# Patient Record
Sex: Male | Born: 1955
Health system: Southern US, Community
[De-identification: ages and names within clinical notes are randomized; demographics above are authoritative.]

## PROBLEM LIST (undated history)

## (undated) DIAGNOSIS — I10 Essential (primary) hypertension: Secondary | ICD-10-CM

## (undated) DIAGNOSIS — E119 Type 2 diabetes mellitus without complications: Secondary | ICD-10-CM

---

## 2019-01-15 ENCOUNTER — Inpatient Hospital Stay (HOSPITAL_COMMUNITY): Payer: HRSA Program

## 2019-01-15 ENCOUNTER — Inpatient Hospital Stay (HOSPITAL_COMMUNITY)
Admission: EM | Admit: 2019-01-15 | Discharge: 2019-01-28 | DRG: 177 | Disposition: A | Discharge status: Home / Self Care | Payer: HRSA Program | Source: Other Acute Inpatient Hospital | Attending: Internal Medicine | Admitting: Internal Medicine

## 2019-01-15 ENCOUNTER — Emergency Department
Admission: EM | Admit: 2019-01-15 | Discharge: 2019-01-15 | Disposition: A | Discharge status: Other Acute Inpatient Hospital | Payer: HRSA Program | Attending: Emergency Medicine | Admitting: Emergency Medicine

## 2019-01-15 ENCOUNTER — Encounter (HOSPITAL_COMMUNITY): Payer: Self-pay

## 2019-01-15 ENCOUNTER — Encounter: Payer: Self-pay | Admitting: Emergency Medicine

## 2019-01-15 ENCOUNTER — Other Ambulatory Visit: Payer: Self-pay

## 2019-01-15 ENCOUNTER — Inpatient Hospital Stay (HOSPITAL_COMMUNITY): Payer: Self-pay

## 2019-01-15 ENCOUNTER — Emergency Department: Payer: HRSA Program

## 2019-01-15 DIAGNOSIS — E860 Dehydration: Secondary | ICD-10-CM | POA: Diagnosis present

## 2019-01-15 DIAGNOSIS — E1165 Type 2 diabetes mellitus with hyperglycemia: Secondary | ICD-10-CM | POA: Diagnosis not present

## 2019-01-15 DIAGNOSIS — G928 Other toxic encephalopathy: Secondary | ICD-10-CM

## 2019-01-15 DIAGNOSIS — J1289 Other viral pneumonia: Secondary | ICD-10-CM | POA: Diagnosis present

## 2019-01-15 DIAGNOSIS — M6282 Rhabdomyolysis: Secondary | ICD-10-CM | POA: Diagnosis present

## 2019-01-15 DIAGNOSIS — R509 Fever, unspecified: Secondary | ICD-10-CM

## 2019-01-15 DIAGNOSIS — I10 Essential (primary) hypertension: Secondary | ICD-10-CM | POA: Diagnosis not present

## 2019-01-15 DIAGNOSIS — U071 COVID-19: Secondary | ICD-10-CM

## 2019-01-15 DIAGNOSIS — I152 Hypertension secondary to endocrine disorders: Secondary | ICD-10-CM

## 2019-01-15 DIAGNOSIS — N179 Acute kidney failure, unspecified: Secondary | ICD-10-CM

## 2019-01-15 DIAGNOSIS — E785 Hyperlipidemia, unspecified: Secondary | ICD-10-CM | POA: Diagnosis present

## 2019-01-15 DIAGNOSIS — Z7984 Long term (current) use of oral hypoglycemic drugs: Secondary | ICD-10-CM

## 2019-01-15 DIAGNOSIS — Z87891 Personal history of nicotine dependence: Secondary | ICD-10-CM

## 2019-01-15 DIAGNOSIS — R0603 Acute respiratory distress: Secondary | ICD-10-CM

## 2019-01-15 DIAGNOSIS — R6521 Severe sepsis with septic shock: Secondary | ICD-10-CM | POA: Diagnosis not present

## 2019-01-15 DIAGNOSIS — A419 Sepsis, unspecified organism: Secondary | ICD-10-CM | POA: Diagnosis not present

## 2019-01-15 DIAGNOSIS — E872 Acidosis: Secondary | ICD-10-CM | POA: Diagnosis present

## 2019-01-15 DIAGNOSIS — R1310 Dysphagia, unspecified: Secondary | ICD-10-CM | POA: Diagnosis present

## 2019-01-15 DIAGNOSIS — I5033 Acute on chronic diastolic (congestive) heart failure: Secondary | ICD-10-CM | POA: Diagnosis present

## 2019-01-15 DIAGNOSIS — Z8611 Personal history of tuberculosis: Secondary | ICD-10-CM | POA: Diagnosis not present

## 2019-01-15 DIAGNOSIS — R0602 Shortness of breath: Secondary | ICD-10-CM | POA: Diagnosis present

## 2019-01-15 DIAGNOSIS — Z79899 Other long term (current) drug therapy: Secondary | ICD-10-CM | POA: Insufficient documentation

## 2019-01-15 DIAGNOSIS — J9601 Acute respiratory failure with hypoxia: Secondary | ICD-10-CM | POA: Diagnosis present

## 2019-01-15 DIAGNOSIS — G92 Toxic encephalopathy: Secondary | ICD-10-CM | POA: Diagnosis present

## 2019-01-15 DIAGNOSIS — E118 Type 2 diabetes mellitus with unspecified complications: Secondary | ICD-10-CM | POA: Diagnosis present

## 2019-01-15 DIAGNOSIS — J1520 Pneumonia due to staphylococcus, unspecified: Secondary | ICD-10-CM | POA: Diagnosis present

## 2019-01-15 DIAGNOSIS — N170 Acute kidney failure with tubular necrosis: Secondary | ICD-10-CM | POA: Diagnosis not present

## 2019-01-15 DIAGNOSIS — R739 Hyperglycemia, unspecified: Secondary | ICD-10-CM

## 2019-01-15 DIAGNOSIS — A4189 Other specified sepsis: Secondary | ICD-10-CM | POA: Diagnosis present

## 2019-01-15 DIAGNOSIS — R55 Syncope and collapse: Secondary | ICD-10-CM | POA: Diagnosis not present

## 2019-01-15 DIAGNOSIS — E875 Hyperkalemia: Secondary | ICD-10-CM | POA: Diagnosis present

## 2019-01-15 DIAGNOSIS — I5031 Acute diastolic (congestive) heart failure: Secondary | ICD-10-CM

## 2019-01-15 DIAGNOSIS — G9341 Metabolic encephalopathy: Secondary | ICD-10-CM

## 2019-01-15 DIAGNOSIS — Z66 Do not resuscitate: Secondary | ICD-10-CM | POA: Diagnosis present

## 2019-01-15 DIAGNOSIS — R7989 Other specified abnormal findings of blood chemistry: Secondary | ICD-10-CM

## 2019-01-15 DIAGNOSIS — I110 Hypertensive heart disease with heart failure: Secondary | ICD-10-CM | POA: Diagnosis present

## 2019-01-15 HISTORY — DX: Essential (primary) hypertension: I10

## 2019-01-15 HISTORY — DX: Type 2 diabetes mellitus without complications: E11.9

## 2019-01-15 LAB — URINALYSIS, COMPLETE (UACMP) WITH MICROSCOPIC
Bilirubin Urine: NEGATIVE
Glucose, UA: 150 mg/dL — AB
Ketones, ur: NEGATIVE mg/dL
Leukocytes,Ua: NEGATIVE
Nitrite: NEGATIVE
Protein, ur: >=300 mg/dL — AB
Specific Gravity, Urine: 1.031 — ABNORMAL HIGH (ref 1.005–1.030)
pH: 5 (ref 5.0–8.0)

## 2019-01-15 LAB — BLOOD GAS, ARTERIAL
Acid-base deficit: 5.2 mmol/L — ABNORMAL HIGH (ref 0.0–2.0)
Bicarbonate: 20 mmol/L (ref 20.0–28.0)
Delivery systems: POSITIVE
Expiratory PAP: 6
FIO2: 0.7
Inspiratory PAP: 12
Mechanical Rate: 8
O2 Saturation: 92.4 %
Patient temperature: 37
pCO2 arterial: 37 mmHg (ref 32.0–48.0)
pH, Arterial: 7.34 — ABNORMAL LOW (ref 7.350–7.450)
pO2, Arterial: 69 mmHg — ABNORMAL LOW (ref 83.0–108.0)

## 2019-01-15 LAB — CBC WITH DIFFERENTIAL/PLATELET
Abs Immature Granulocytes: 0.03 10*3/uL (ref 0.00–0.07)
Abs Immature Granulocytes: 0.06 10*3/uL (ref 0.00–0.07)
Basophils Absolute: 0.1 10*3/uL (ref 0.0–0.1)
Basophils Absolute: 0.1 10*3/uL (ref 0.0–0.1)
Basophils Relative: 1 %
Basophils Relative: 1 %
Eosinophils Absolute: 0.1 10*3/uL (ref 0.0–0.5)
Eosinophils Absolute: 0 10*3/uL (ref 0.0–0.5)
Eosinophils Relative: 1 %
Eosinophils Relative: 0 %
HCT: 49.8 % (ref 39.0–52.0)
HCT: 49.6 % (ref 39.0–52.0)
Hemoglobin: 15.3 g/dL (ref 13.0–17.0)
Hemoglobin: 15 g/dL (ref 13.0–17.0)
Immature Granulocytes: 0 %
Immature Granulocytes: 1 %
Lymphocytes Relative: 16 %
Lymphocytes Relative: 17 %
Lymphs Abs: 1.4 10*3/uL (ref 0.7–4.0)
Lymphs Abs: 1.1 10*3/uL (ref 0.7–4.0)
MCH: 24.2 pg — ABNORMAL LOW (ref 26.0–34.0)
MCH: 24.4 pg — ABNORMAL LOW (ref 26.0–34.0)
MCHC: 30.7 g/dL (ref 30.0–36.0)
MCHC: 30.2 g/dL (ref 30.0–36.0)
MCV: 78.7 fL — ABNORMAL LOW (ref 80.0–100.0)
MCV: 80.5 fL (ref 80.0–100.0)
Monocytes Absolute: 0.6 10*3/uL (ref 0.1–1.0)
Monocytes Absolute: 0.4 10*3/uL (ref 0.1–1.0)
Monocytes Relative: 7 %
Monocytes Relative: 6 %
Neutro Abs: 6.2 10*3/uL (ref 1.7–7.7)
Neutro Abs: 4.9 10*3/uL (ref 1.7–7.7)
Neutrophils Relative %: 75 %
Neutrophils Relative %: 75 %
Platelets: 236 10*3/uL (ref 150–400)
Platelets: 207 10*3/uL (ref 150–400)
RBC: 6.33 MIL/uL — ABNORMAL HIGH (ref 4.22–5.81)
RBC: 6.16 MIL/uL — ABNORMAL HIGH (ref 4.22–5.81)
RDW: 15.1 % (ref 11.5–15.5)
RDW: 15.1 % (ref 11.5–15.5)
WBC: 8.4 10*3/uL (ref 4.0–10.5)
WBC: 6.6 10*3/uL (ref 4.0–10.5)
nRBC: 0 % (ref 0.0–0.2)
nRBC: 0 % (ref 0.0–0.2)

## 2019-01-15 LAB — PROCALCITONIN
Procalcitonin: 74.8 ng/mL
Procalcitonin: 111.36 ng/mL

## 2019-01-15 LAB — URINALYSIS, ROUTINE W REFLEX MICROSCOPIC
Bilirubin Urine: NEGATIVE
Glucose, UA: 50 mg/dL — AB
Ketones, ur: NEGATIVE mg/dL
Leukocytes,Ua: NEGATIVE
Nitrite: NEGATIVE
Protein, ur: 100 mg/dL — AB
Specific Gravity, Urine: 1.019 (ref 1.005–1.030)
pH: 5 (ref 5.0–8.0)

## 2019-01-15 LAB — C-REACTIVE PROTEIN
CRP: 24.2 mg/dL — ABNORMAL HIGH (ref <1.0)
CRP: 35.6 mg/dL — ABNORMAL HIGH (ref <1.0)

## 2019-01-15 LAB — COMPREHENSIVE METABOLIC PANEL
ALT: 50 U/L — ABNORMAL HIGH (ref 0–44)
ALT: 55 U/L — ABNORMAL HIGH (ref 0–44)
AST: 69 U/L — ABNORMAL HIGH (ref 15–41)
AST: 111 U/L — ABNORMAL HIGH (ref 15–41)
Albumin: 4.3 g/dL (ref 3.5–5.0)
Albumin: 3.9 g/dL (ref 3.5–5.0)
Alkaline Phosphatase: 63 U/L (ref 38–126)
Alkaline Phosphatase: 50 U/L (ref 38–126)
Anion gap: 18 — ABNORMAL HIGH (ref 5–15)
Anion gap: 12 (ref 5–15)
BUN: 26 mg/dL — ABNORMAL HIGH (ref 8–23)
BUN: 34 mg/dL — ABNORMAL HIGH (ref 8–23)
CO2: 20 mmol/L — ABNORMAL LOW (ref 22–32)
CO2: 26 mmol/L (ref 22–32)
Calcium: 8.2 mg/dL — ABNORMAL LOW (ref 8.9–10.3)
Calcium: 8 mg/dL — ABNORMAL LOW (ref 8.9–10.3)
Chloride: 94 mmol/L — ABNORMAL LOW (ref 98–111)
Chloride: 97 mmol/L — ABNORMAL LOW (ref 98–111)
Creatinine, Ser: 1.99 mg/dL — ABNORMAL HIGH (ref 0.61–1.24)
Creatinine, Ser: 2.31 mg/dL — ABNORMAL HIGH (ref 0.61–1.24)
GFR calc Af Amer: 40 mL/min — ABNORMAL LOW (ref >60)
GFR calc Af Amer: 34 mL/min — ABNORMAL LOW (ref >60)
GFR calc non Af Amer: 35 mL/min — ABNORMAL LOW (ref >60)
GFR calc non Af Amer: 29 mL/min — ABNORMAL LOW (ref >60)
Glucose, Bld: 343 mg/dL — ABNORMAL HIGH (ref 70–99)
Glucose, Bld: 296 mg/dL — ABNORMAL HIGH (ref 70–99)
Potassium: 4.8 mmol/L (ref 3.5–5.1)
Potassium: 5.9 mmol/L — ABNORMAL HIGH (ref 3.5–5.1)
Sodium: 132 mmol/L — ABNORMAL LOW (ref 135–145)
Sodium: 135 mmol/L (ref 135–145)
Total Bilirubin: 0.6 mg/dL (ref 0.3–1.2)
Total Bilirubin: 0.5 mg/dL (ref 0.3–1.2)
Total Protein: 8.3 g/dL — ABNORMAL HIGH (ref 6.5–8.1)
Total Protein: 8 g/dL (ref 6.5–8.1)

## 2019-01-15 LAB — LACTIC ACID, PLASMA
Lactic Acid, Venous: 6 mmol/L (ref 0.5–1.9)
Lactic Acid, Venous: 3.6 mmol/L (ref 0.5–1.9)

## 2019-01-15 LAB — TRIGLYCERIDES: Triglycerides: 155 mg/dL — ABNORMAL HIGH (ref <150)

## 2019-01-15 LAB — BRAIN NATRIURETIC PEPTIDE
B Natriuretic Peptide: 161 pg/mL — ABNORMAL HIGH (ref 0.0–100.0)
B Natriuretic Peptide: 83.4 pg/mL (ref 0.0–100.0)

## 2019-01-15 LAB — CK: Total CK: 11100 U/L — ABNORMAL HIGH (ref 49–397)

## 2019-01-15 LAB — HEMOGLOBIN A1C
Hgb A1c MFr Bld: 8.2 % — ABNORMAL HIGH (ref 4.8–5.6)
Mean Plasma Glucose: 188.64 mg/dL

## 2019-01-15 LAB — GLUCOSE, CAPILLARY
Glucose-Capillary: 264 mg/dL — ABNORMAL HIGH (ref 70–99)
Glucose-Capillary: 229 mg/dL — ABNORMAL HIGH (ref 70–99)
Glucose-Capillary: 220 mg/dL — ABNORMAL HIGH (ref 70–99)

## 2019-01-15 LAB — D-DIMER, QUANTITATIVE: D-Dimer, Quant: 4.41 ug/mL-FEU — ABNORMAL HIGH (ref 0.00–0.50)

## 2019-01-15 LAB — TROPONIN I (HIGH SENSITIVITY)
Troponin I (High Sensitivity): 85 ng/L — ABNORMAL HIGH (ref <18)
Troponin I (High Sensitivity): 124 ng/L (ref <18)
Troponin I (High Sensitivity): 96 ng/L — ABNORMAL HIGH (ref <18)

## 2019-01-15 LAB — POC SARS CORONAVIRUS 2 AG: SARS Coronavirus 2 Ag: POSITIVE — AB

## 2019-01-15 LAB — FIBRINOGEN: Fibrinogen: 554 mg/dL — ABNORMAL HIGH (ref 210–475)

## 2019-01-15 LAB — HIV ANTIBODY (ROUTINE TESTING W REFLEX): HIV Screen 4th Generation wRfx: NONREACTIVE

## 2019-01-15 LAB — LACTATE DEHYDROGENASE: LDH: 343 U/L — ABNORMAL HIGH (ref 98–192)

## 2019-01-15 LAB — FIBRIN DERIVATIVES D-DIMER (ARMC ONLY): Fibrin derivatives D-dimer (ARMC): 2242.86 ng/mL (FEU) — ABNORMAL HIGH (ref 0.00–499.00)

## 2019-01-15 LAB — ABO/RH: ABO/RH(D): O POS

## 2019-01-15 LAB — FERRITIN: Ferritin: 402 ng/mL — ABNORMAL HIGH (ref 24–336)

## 2019-01-15 LAB — ETHANOL: Alcohol, Ethyl (B): <10 mg/dL (ref <10)

## 2019-01-15 MED ORDER — ACETAMINOPHEN 500 MG PO TABS
1000.0000 mg | ORAL_TABLET | Freq: Once | ORAL | Status: AC
  Administered 2019-01-15: 1000 mg via ORAL
  Filled 2019-01-15: qty 2

## 2019-01-15 MED ORDER — ONDANSETRON HCL 4 MG/2ML IJ SOLN: 4.0000 mg | Freq: Four times a day (QID) | INTRAMUSCULAR | Status: DC | PRN

## 2019-01-15 MED ORDER — SODIUM CHLORIDE 0.9 % IV SOLN: 100.0000 mg | Freq: Every day | INTRAVENOUS | Status: DC

## 2019-01-15 MED ORDER — ASPIRIN 81 MG PO CHEW
81.0000 mg | CHEWABLE_TABLET | Freq: Every day | ORAL | Status: DC
  Filled 2019-01-15: qty 1

## 2019-01-15 MED ORDER — SODIUM CHLORIDE 0.9 % IV SOLN
200.0000 mg | Freq: Once | INTRAVENOUS | Status: DC
  Filled 2019-01-15: qty 40

## 2019-01-15 MED ORDER — INSULIN ASPART 100 UNIT/ML ~~LOC~~ SOLN
10.0000 [IU] | Freq: Once | SUBCUTANEOUS | Status: AC
  Administered 2019-01-15: 10 [IU] via SUBCUTANEOUS
  Filled 2019-01-15: qty 1

## 2019-01-15 MED ORDER — INSULIN GLARGINE 100 UNIT/ML ~~LOC~~ SOLN
35.0000 [IU] | Freq: Every day | SUBCUTANEOUS | Status: DC
  Administered 2019-01-16 – 2019-01-18 (×3): 35 [IU] via SUBCUTANEOUS
  Filled 2019-01-15 (×4): qty 0.35

## 2019-01-15 MED ORDER — METHYLPREDNISOLONE SODIUM SUCC 40 MG IJ SOLR
40.0000 mg | Freq: Two times a day (BID) | INTRAMUSCULAR | Status: DC
  Administered 2019-01-15: 40 mg via INTRAVENOUS
  Filled 2019-01-15: qty 1

## 2019-01-15 MED ORDER — DEXTROSE 5 % IV SOLN: INTRAVENOUS | Status: DC

## 2019-01-15 MED ORDER — SODIUM CHLORIDE 0.9 % IV SOLN
200.0000 mg | Freq: Once | INTRAVENOUS | Status: AC
  Administered 2019-01-15: 200 mg via INTRAVENOUS
  Filled 2019-01-15: qty 40

## 2019-01-15 MED ORDER — SODIUM CHLORIDE 0.9 % IV BOLUS
1000.0000 mL | Freq: Once | INTRAVENOUS | Status: AC
  Administered 2019-01-15: 1000 mL via INTRAVENOUS

## 2019-01-15 MED ORDER — CLONIDINE HCL 0.2 MG/24HR TD PTWK
0.2000 mg | MEDICATED_PATCH | TRANSDERMAL | Status: DC
  Administered 2019-01-15: 0.2 mg via TRANSDERMAL
  Filled 2019-01-15: qty 1

## 2019-01-15 MED ORDER — AMLODIPINE BESYLATE 5 MG PO TABS
10.0000 mg | ORAL_TABLET | Freq: Every day | ORAL | Status: DC
  Administered 2019-01-16 – 2019-01-28 (×13): 10 mg via ORAL
  Filled 2019-01-15 (×13): qty 2

## 2019-01-15 MED ORDER — LACTATED RINGERS IV SOLN
INTRAVENOUS | Status: DC
  Administered 2019-01-15 – 2019-01-16 (×3): via INTRAVENOUS

## 2019-01-15 MED ORDER — ONDANSETRON HCL 4 MG PO TABS: 4.0000 mg | ORAL_TABLET | Freq: Four times a day (QID) | ORAL | Status: DC | PRN

## 2019-01-15 MED ORDER — METOPROLOL TARTRATE 5 MG/5ML IV SOLN
5.0000 mg | INTRAVENOUS | Status: DC | PRN
  Administered 2019-01-15: 5 mg via INTRAVENOUS
  Filled 2019-01-15: qty 5

## 2019-01-15 MED ORDER — SODIUM CHLORIDE 0.9 % IV SOLN
3.0000 g | Freq: Four times a day (QID) | INTRAVENOUS | Status: DC
  Administered 2019-01-15 – 2019-01-21 (×25): 3 g via INTRAVENOUS
  Filled 2019-01-15: qty 3
  Filled 2019-01-15 (×3): qty 8
  Filled 2019-01-15: qty 3
  Filled 2019-01-15 (×4): qty 8
  Filled 2019-01-15: qty 3
  Filled 2019-01-15: qty 8
  Filled 2019-01-15 (×2): qty 3
  Filled 2019-01-15 (×5): qty 8
  Filled 2019-01-15 (×2): qty 3
  Filled 2019-01-15 (×2): qty 8
  Filled 2019-01-15: qty 3
  Filled 2019-01-15 (×7): qty 8
  Filled 2019-01-15: qty 3

## 2019-01-15 MED ORDER — METHYLPREDNISOLONE SODIUM SUCC 40 MG IJ SOLR
40.0000 mg | Freq: Once | INTRAMUSCULAR | Status: AC
  Administered 2019-01-15: 40 mg via INTRAVENOUS
  Filled 2019-01-15: qty 1

## 2019-01-15 MED ORDER — LACTATED RINGERS IV SOLN
INTRAVENOUS | Status: DC
  Administered 2019-01-15: 11:00:00 via INTRAVENOUS

## 2019-01-15 MED ORDER — SODIUM CHLORIDE 0.9 % IV SOLN
500.0000 mg | Freq: Once | INTRAVENOUS | Status: AC
  Administered 2019-01-15: 500 mg via INTRAVENOUS

## 2019-01-15 MED ORDER — SODIUM CHLORIDE 0.9% FLUSH
3.0000 mL | Freq: Two times a day (BID) | INTRAVENOUS | Status: DC
  Administered 2019-01-15 (×2): 3 mL via INTRAVENOUS

## 2019-01-15 MED ORDER — INSULIN ASPART 100 UNIT/ML ~~LOC~~ SOLN
0.0000 [IU] | SUBCUTANEOUS | Status: DC
  Administered 2019-01-15 – 2019-01-16 (×3): 7 [IU] via SUBCUTANEOUS
  Administered 2019-01-16 (×2): 15 [IU] via SUBCUTANEOUS
  Administered 2019-01-16 (×2): 4 [IU] via SUBCUTANEOUS
  Administered 2019-01-16: 11 [IU] via SUBCUTANEOUS
  Administered 2019-01-17: 7 [IU] via SUBCUTANEOUS
  Administered 2019-01-17 (×2): 20 [IU] via SUBCUTANEOUS
  Administered 2019-01-17 (×2): 4 [IU] via SUBCUTANEOUS
  Administered 2019-01-18: 15 [IU] via SUBCUTANEOUS
  Administered 2019-01-18: 7 [IU] via SUBCUTANEOUS
  Administered 2019-01-18: 3 [IU] via SUBCUTANEOUS
  Administered 2019-01-18: 4 [IU] via SUBCUTANEOUS
  Administered 2019-01-18: 7 [IU] via SUBCUTANEOUS
  Administered 2019-01-19 (×2): 15 [IU] via SUBCUTANEOUS
  Administered 2019-01-19 (×2): 3 [IU] via SUBCUTANEOUS
  Administered 2019-01-20: 7 [IU] via SUBCUTANEOUS
  Administered 2019-01-20: 4 [IU] via SUBCUTANEOUS
  Administered 2019-01-20: 09:00:00 3 [IU] via SUBCUTANEOUS
  Administered 2019-01-20: 11 [IU] via SUBCUTANEOUS
  Administered 2019-01-21: 20 [IU] via SUBCUTANEOUS
  Administered 2019-01-21: 4 [IU] via SUBCUTANEOUS
  Administered 2019-01-22: 20 [IU] via SUBCUTANEOUS
  Administered 2019-01-22: 7 [IU] via SUBCUTANEOUS
  Administered 2019-01-22: 11 [IU] via SUBCUTANEOUS
  Administered 2019-01-22: 20 [IU] via SUBCUTANEOUS
  Administered 2019-01-22: 20:00:00 7 [IU] via SUBCUTANEOUS
  Administered 2019-01-23 (×2): 4 [IU] via SUBCUTANEOUS
  Administered 2019-01-23: 7 [IU] via SUBCUTANEOUS
  Administered 2019-01-23: 15 [IU] via SUBCUTANEOUS
  Administered 2019-01-23: 7 [IU] via SUBCUTANEOUS
  Administered 2019-01-23: 15 [IU] via SUBCUTANEOUS
  Administered 2019-01-23: 3 [IU] via SUBCUTANEOUS
  Administered 2019-01-24: 20 [IU] via SUBCUTANEOUS
  Administered 2019-01-24: 3 [IU] via SUBCUTANEOUS
  Administered 2019-01-24: 11 [IU] via SUBCUTANEOUS
  Administered 2019-01-24: 15 [IU] via SUBCUTANEOUS
  Administered 2019-01-24: 3 [IU] via SUBCUTANEOUS
  Administered 2019-01-25: 11 [IU] via SUBCUTANEOUS
  Administered 2019-01-25 (×3): 3 [IU] via SUBCUTANEOUS
  Administered 2019-01-25: 11 [IU] via SUBCUTANEOUS
  Administered 2019-01-25: 7 [IU] via SUBCUTANEOUS
  Administered 2019-01-26: 11 [IU] via SUBCUTANEOUS
  Administered 2019-01-26: 15 [IU] via SUBCUTANEOUS
  Administered 2019-01-26: 11 [IU] via SUBCUTANEOUS
  Administered 2019-01-26: 7 [IU] via SUBCUTANEOUS
  Administered 2019-01-26: 4 [IU] via SUBCUTANEOUS
  Administered 2019-01-27 (×2): 11 [IU] via SUBCUTANEOUS
  Administered 2019-01-27: 7 [IU] via SUBCUTANEOUS
  Administered 2019-01-27: 15 [IU] via SUBCUTANEOUS
  Administered 2019-01-28: 7 [IU] via SUBCUTANEOUS

## 2019-01-15 MED ORDER — SODIUM POLYSTYRENE SULFONATE 15 GM/60ML PO SUSP
30.0000 g | Freq: Once | ORAL | Status: AC
  Administered 2019-01-15: 30 g via ORAL
  Filled 2019-01-15: qty 120

## 2019-01-15 MED ORDER — SODIUM CHLORIDE 0.9 % IV SOLN
100.0000 mg | Freq: Every day | INTRAVENOUS | Status: AC
  Administered 2019-01-16 – 2019-01-19 (×4): 100 mg via INTRAVENOUS
  Filled 2019-01-15 (×4): qty 20

## 2019-01-15 MED ORDER — INSULIN ASPART 100 UNIT/ML ~~LOC~~ SOLN
0.0000 [IU] | Freq: Three times a day (TID) | SUBCUTANEOUS | Status: DC
  Administered 2019-01-15: 5 [IU] via SUBCUTANEOUS

## 2019-01-15 MED ORDER — CARVEDILOL 3.125 MG PO TABS
6.2500 mg | ORAL_TABLET | Freq: Two times a day (BID) | ORAL | Status: DC
  Administered 2019-01-16 – 2019-01-28 (×25): 6.25 mg via ORAL
  Filled 2019-01-15 (×26): qty 2

## 2019-01-15 MED ORDER — INSULIN ASPART 100 UNIT/ML ~~LOC~~ SOLN: 0.0000 [IU] | Freq: Every day | SUBCUTANEOUS | Status: DC

## 2019-01-15 MED ORDER — INSULIN GLARGINE 100 UNIT/ML ~~LOC~~ SOLN
15.0000 [IU] | Freq: Once | SUBCUTANEOUS | Status: DC
  Filled 2019-01-15: qty 0.15

## 2019-01-15 MED ORDER — DOCUSATE SODIUM 100 MG PO CAPS
100.0000 mg | ORAL_CAPSULE | Freq: Two times a day (BID) | ORAL | Status: DC
  Administered 2019-01-16 – 2019-01-28 (×24): 100 mg via ORAL
  Filled 2019-01-15 (×24): qty 1

## 2019-01-15 MED ORDER — INSULIN ASPART 100 UNIT/ML ~~LOC~~ SOLN: 0.0000 [IU] | Freq: Three times a day (TID) | SUBCUTANEOUS | Status: DC

## 2019-01-15 MED ORDER — ACETAMINOPHEN 325 MG PO TABS
650.0000 mg | ORAL_TABLET | Freq: Four times a day (QID) | ORAL | Status: DC | PRN
  Administered 2019-01-17 – 2019-01-22 (×8): 650 mg via ORAL
  Filled 2019-01-15 (×10): qty 2

## 2019-01-15 MED ORDER — INSULIN GLARGINE 100 UNIT/ML ~~LOC~~ SOLN
20.0000 [IU] | Freq: Every day | SUBCUTANEOUS | Status: DC
  Administered 2019-01-15: 20 [IU] via SUBCUTANEOUS
  Filled 2019-01-15: qty 0.2

## 2019-01-15 MED ORDER — INSULIN ASPART 100 UNIT/ML ~~LOC~~ SOLN
3.0000 [IU] | Freq: Three times a day (TID) | SUBCUTANEOUS | Status: DC
  Administered 2019-01-16 – 2019-01-27 (×30): 3 [IU] via SUBCUTANEOUS

## 2019-01-15 MED ORDER — ACETAMINOPHEN 650 MG RE SUPP
650.0000 mg | RECTAL | Status: DC | PRN
  Administered 2019-01-15 – 2019-01-18 (×2): 650 mg via RECTAL
  Filled 2019-01-15 (×3): qty 1

## 2019-01-15 MED ORDER — SODIUM CHLORIDE 0.9 % IV BOLUS (SEPSIS): 1000.0000 mL | Freq: Once | INTRAVENOUS | Status: DC

## 2019-01-15 MED ORDER — SODIUM POLYSTYRENE SULFONATE 15 GM/60ML PO SUSP: 30.0000 g | Freq: Once | ORAL | Status: DC

## 2019-01-15 MED ORDER — HEPARIN SODIUM (PORCINE) 5000 UNIT/ML IJ SOLN
7500.0000 [IU] | Freq: Three times a day (TID) | INTRAMUSCULAR | Status: DC
  Administered 2019-01-15 – 2019-01-16 (×3): 7500 [IU] via SUBCUTANEOUS
  Filled 2019-01-15 (×4): qty 2

## 2019-01-15 MED ORDER — PRAVASTATIN SODIUM 40 MG PO TABS: 40.0000 mg | ORAL_TABLET | Freq: Every day | ORAL | Status: DC

## 2019-01-15 MED ORDER — SODIUM CHLORIDE 0.9 % IV SOLN
1.0000 g | Freq: Once | INTRAVENOUS | Status: AC
  Administered 2019-01-15: 1 g via INTRAVENOUS

## 2019-01-15 MED ORDER — DEXAMETHASONE SODIUM PHOSPHATE 10 MG/ML IJ SOLN
10.0000 mg | Freq: Once | INTRAMUSCULAR | Status: AC
  Administered 2019-01-15: 10 mg via INTRAVENOUS
  Filled 2019-01-15: qty 1

## 2019-01-15 MED ORDER — HEPARIN SODIUM (PORCINE) 5000 UNIT/ML IJ SOLN: 5000.0000 [IU] | Freq: Three times a day (TID) | INTRAMUSCULAR | Status: DC

## 2019-01-15 NOTE — CV Procedure (Signed)
2D echo was attempted twice but RN stataed he was going to CT, then tried at 3:06 and staff doing patient care.

## 2019-01-15 NOTE — Progress Notes (Signed)
Pt noted to have open scratches aprox. 2-3cm wide with some scabs. Also noted to have 5 elongated scratches on R buttock approx 4-5 inches long. Wound consult to be requested.

## 2019-01-15 NOTE — H&P (Signed)
TRH H&P   Patient Demographics:    Austin Shannon, is a 63 y.o. male  MRN: [id number]   DOB - 08-Oct-1955  Admit Date - 01/15/2019  Outpatient Primary MD for the patient is Patient, No Pcp Per  Patient coming from: [facility] ER.  Found down at house   HPI:    Austin Shannon  is a 63 y.o. 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   She had arrived around 10 AM on 01/15/2019 [facility] extremely lethargic, short of breath and gurgling, lactate greater than 3, creatinine greater than 2.5, ABG suggesting severe hypoxia, on nonrebreather mask, AKI, transaminitis, elevated troponin, hyperkalemic, high CK, extremely elevated procalcitonin, he answers basic questions follows basic commands and states that he has been having a cough and low-grade fever for last several days.  Denies any focal weakness, denies any aches or pains.  Also called his wife over the phone who states that she has been out of town and talking to him over the phone and realized that he was sick few days ago.    Review of systems:    A full 10 point Review of Systems was done, except as stated above, all other Review of Systems were negative.   With Past History of the following :      Past Medical History:  Diagnosis Date   Diabetes mellitus without complication (HCC)    Hypertension       No past surgical history on file.    Social History:     Social History   Tobacco Use   Smoking status: Former Smoker   Smokeless tobacco: Never Used  Substance Use Topics   Alcohol use: Not on file         Family History :   No family history of COVID-19   Home Medications:  Prior to Admission medications   Medication Sig Start Date End Date Taking? Authorizing Provider  albuterol (VENTOLIN HFA) 108 (90 Base) MCG/ACT inhaler Inhale into the lungs every 6 (six) hours as needed for wheezing or shortness of breath.   Yes [provider]  carvedilol (COREG) 12.5 MG tablet Take 12.5 mg by mouth 2 (two) times daily.   Yes [provider]  cetirizine (ZYRTEC) 10 MG tablet Take 10 mg by mouth daily.   Yes [provider]  glimepiride (AMARYL) 2 MG tablet Take 2 mg by mouth 3 (three) times daily. 12/23/18  Yes [provider]  hydrALAZINE (APRESOLINE) 50 MG tablet Take 50 mg by mouth 2 (two) times daily.   Yes [provider]  ibuprofen (ADVIL) 600 MG tablet Take 600 mg by mouth 3 (three) times daily as needed.   Yes [provider]  metFORMIN (GLUCOPHAGE) 850 MG tablet Take 850 mg by mouth 2 (two) times daily with a meal.   Yes [provider]  pravastatin (PRAVACHOL) 40 MG tablet Take 40 mg by mouth daily. 12/23/18  Yes [provider]  sildenafil (VIAGRA) 100 MG tablet Take 100 mg by mouth daily as needed for erectile dysfunction.   Yes [provider]     Allergies:    No Known Allergies   Physical Exam:   Vitals  Temperature 99.8 F (37.7 C), temperature source Axillary.   1. General middle-aged well-built African-American gentleman who appears lethargic, wearing nonrebreather mask, gurgling and little short of breath, able to answer basic questions and follow basic  commands,  2. Normal affect and insight, Not Suicidal or Homicidal, Awake Alert, moving all 4 extremities to command,  3. No F.N deficits, ALL C.Nerves Intact, Strength 5/5 all 4 extremities, Sensation intact all 4 extremities, Plantars down going.  4. Ears and Eyes appear Normal, Conjunctivae clear, PERRLA. Moist Oral Mucosa.  5. Supple Neck, No JVD, No cervical lymphadenopathy appriciated, No Carotid Bruits.  6. Symmetrical Chest wall movement, Good air movement bilaterally, coarse bilateral breath sounds.  7. RRR, No Gallops, Rubs or Murmurs, No Parasternal Heave.  8. Positive Bowel Sounds, Abdomen Soft, No tenderness, No organomegaly appriciated,No rebound -guarding or rigidity.  9.  No Cyanosis, Normal Skin Turgor, No Skin Rash or Bruise.  10. Good muscle tone,  joints appear normal , no effusions, Normal ROM.  11. No Palpable Lymph Nodes in Neck or Axillae       Data Review:    CBC Recent Labs  Lab 01/15/19 0257 01/15/19 1136  WBC 8.4 6.6  HGB 15.3 15.0  HCT 49.8 49.6  PLT 236 207  MCV 78.7* 80.5  MCH 24.2* 24.4*  MCHC 30.7 30.2  RDW 15.1 15.1  LYMPHSABS 1.4 1.1  MONOABS 0.6 0.4  EOSABS 0.1 0.0  BASOSABS 0.1 0.1   ------------------------------------------------------------------------------------------------------------------  Chemistries  Recent Labs  Lab 01/15/19 0257 01/15/19 1136  NA 132* 135  K 4.8 5.9*  CL 94* 97*  CO2 20* 26  GLUCOSE 343* 296*  BUN 26* 34*  CREATININE 1.99* 2.31*  CALCIUM 8.2* 8.0*  AST 69* 111*  ALT 50* 55*  ALKPHOS 63 50  BILITOT 0.6 0.5   ------------------------------------------------------------------------------------------------------------------ CrCl cannot be calculated (Unknown ideal weight.). ------------------------------------------------------------------------------------------------------------------ No results for input(s): TSH, T4TOTAL, T3FREE, THYROIDAB in the last 72 hours.  Invalid input(s):  FREET3  Coagulation profile No results for input(s): INR, PROTIME in the last 168 hours. ------------------------------------------------------------------------------------------------------------------- Recent Labs    01/15/19 1136  DDIMER 4.41*   -------------------------------------------------------------------------------------------------------------------  Cardiac Enzymes No results for input(s): CKMB, TROPONINI, MYOGLOBIN in the last 168 hours.  Invalid input(s): CK ------------------------------------------------------------------------------------------------------------------    Component Value Date/Time   BNP 83.4 01/15/2019 1136     ---------------------------------------------------------------------------------------------------------------  Urinalysis    Component Value Date/Time   COLORURINE AMBER (A) 01/15/2019 0941   APPEARANCEUR CLOUDY (A) 01/15/2019 0941   LABSPEC 1.031 (H) 01/15/2019 0941   PHURINE 5.0 01/15/2019 0941   GLUCOSEU 150 (A) 01/15/2019 0941   HGBUR LARGE (A) 01/15/2019 0941   BILIRUBINUR NEGATIVE 01/15/2019 0941   KETONESUR NEGATIVE 01/15/2019 0941   PROTEINUR >=300 (A) 01/15/2019 0941   NITRITE NEGATIVE 01/15/2019 0941   LEUKOCYTESUR NEGATIVE 01/15/2019 0941    ----------------------------------------------------------------------------------------------------------------   Imaging Results:    Cxr Am  Result Date: 01/15/2019 CLINICAL DATA:  Acute shortness of breath. Follow-up COVID-19 pneumonia. EXAM: PORTABLE CHEST 1 VIEW 11:16 a.m.: COMPARISON:  Portable chest x-ray earlier same day at 2:55 a.m. FINDINGS: Cardiac silhouette enlarged for AP portable technique, unchanged. Progressive airspace consolidation involving the lower lobes and the RIGHT MIDDLE LOBE since the examination earlier this morning. Streaky opacities in the lingula. UPPER lobes remain clear otherwise. Normal pulmonary vascularity. IMPRESSION: 1. Progressive  bilateral lower lobe pneumonia since the examination earlier this morning. 2. Streaky opacities in the lingula, likely atelectasis. 3. Stable cardiomegaly without pulmonary edema. Electronically Signed   By: Hulan Saas M.D.   On: 01/15/2019 11:26   Dg Chest Port 1 View  Result Date: 01/15/2019 CLINICAL DATA:  Shortness of breath. EXAM: PORTABLE CHEST 1 VIEW COMPARISON:  None. FINDINGS: The heart is enlarged. Patchy right greater than left airspace opacities in the lung bases. No pulmonary edema. No definite pleural effusion. No pneumothorax. No acute osseous abnormalities are seen. IMPRESSION: 1. Right greater than left basilar airspace opacities, suspicious for pneumonia. 2. Mild cardiomegaly. Electronically Signed   By: Narda Rutherford M.D.   On: 01/15/2019 03:19    My personal review of EKG: Rhythm sinus tachycardia with no acute ST changes.  EKG from [place].   Assessment & Plan:    Active Problems:   COVID-19 virus infection    1.  Sepsis, septic shock, COVID-19 pneumonia, cannot rule out concurrent bacterial infection.  Patient is extremely sick, he is hypotensive and in shock, he has AKI, transaminitis, COVID-19 infection with elevated lactate and procalcitonin in excess of 100.  He could easily have a concurrent bacterial source, UA appears unremarkable so far, at this time he will be placed on IV steroids, IV remdesivir and Unasyn.  Blood cultures and urine cultures will be ordered, he will be hydrated with IV fluids and bolus, will monitor lactate and procalcitonin along with cultures.  His condition is extremely guarded.  Cannot use Actemra due to extremely elevated procalcitonin in excess of 100.  2.  Hypotension, dehydration, rhabdomyolysis with AKI and hyperkalemia.  Hydrate, Foley catheter, renal ultrasound, trend CK and renal function.  Kayexalate now and then in the evening.  3.  Transaminitis.  Due to COVID-19 along with rhabdomyolysis, symptom-free no abdominal pain.   However due to extremely high procalcitonin and will check CT scan chest, abdomen and pelvis on immediate basis, for now cover him with Unasyn and monitor.  4.  Dyslipidemia.  Home dose statin from tomorrow.  5.  DM type II.  Check A1c, placed on Lantus and sliding scale every 4.  5.  Toxic encephalopathy.  Mild.  Soft diet, speech to evaluate, PT OT, supportive care and monitor.  6.  Borderline  elevated troponin.  Not an ACS pattern, chest pain-free, EKG nonacute, aspirin, low-dose beta-blocker and statin from tomorrow.  7.  Hypertension.  Low-dose beta-blocker if blood pressure tolerates.     DVT Prophylaxis Heparin    AM Labs Ordered, also please review Full Orders  Family Communication: Admission, patients condition and plan of care including tests being ordered have been discussed with the patient , wife and friend who indicate understanding and agree with the plan and Code Status.  Code Status DNR  Likely DC to  TBD  Condition GUARDED    Consults called: None    Admission status: Inpt    Time spent in minutes : 35   Susa Raring M.D on 01/15/2019 at 2:27 PM  To page go to [email or web address] - password [facility]

## 2019-01-15 NOTE — ED Notes (Signed)
PT GIVES VERBAL CONSENT FOR TRANSFER 

## 2019-01-15 NOTE — ED Notes (Signed)
carelink at bedside to take pt. Pt remains on NRB. ST rhythm. BP stable.

## 2019-01-15 NOTE — ED Provider Notes (Signed)
[facility] Emergency Department Provider Note   ____________________________________________   First MD Initiated Contact with Patient 01/15/19 [phone]     (approximate)  I have reviewed the triage vital signs and the nursing notes.   HISTORY  Chief Complaint Syncope, shortness of breath   HPI Austin BrakemanGregg Shannon is a 63 y.o. male brought to the ED [place] with a chief complaint of syncope and shortness of breath.  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  Room air saturation 82% upon EMS arrival.  Arrives on nonrebreather with saturations 96%.  Patient endorses cough and shortness of breath.  Denies fever, chest pain, abdominal pain, nausea or vomiting.       Past medical history Hypertension Diabetes  There are no active problems to display for this patient.    Prior to Admission medications   Medication Sig Start Date End Date Taking? Authorizing Provider  albuterol (VENTOLIN HFA) 108 (90 Base) MCG/ACT inhaler Inhale into the lungs every 6 (six) hours as needed for wheezing or shortness of breath.   Yes [provider]  carvedilol (COREG) 12.5 MG tablet Take 12.5 mg by mouth 2 (two) times daily.   Yes [provider]  glimepiride (AMARYL) 1 MG tablet Take 1 mg by mouth 3 (three) times daily with meals.   Yes [provider]  hydrALAZINE (APRESOLINE) 50 MG tablet Take 50 mg by mouth 2 (two) times daily.   Yes [provider]  ibuprofen (ADVIL) 600 MG tablet Take 600 mg by mouth 3 (three) times daily as needed.   Yes [provider]  metFORMIN (GLUCOPHAGE) 850 MG tablet Take 850 mg by mouth 2 (two) times daily with a meal.   Yes [provider]  sildenafil (VIAGRA) 100 MG tablet Take 100 mg by mouth daily as needed for erectile dysfunction.   Yes [provider]    Allergies Patient has no allergy information on  record.  No family history on file.  Social History Social History   Tobacco Use   Smoking status: Not on file  Substance Use Topics   Alcohol use: Not on file   Drug use: Not on file  No recent EtOH  Review of Systems  Constitutional: No fever/chills Eyes: No visual changes. ENT: No sore throat. Cardiovascular: Denies chest pain. Respiratory: Positive for cough and shortness of breath. Gastrointestinal: No abdominal pain.  No nausea, no vomiting.  No diarrhea.  No constipation. Genitourinary: Negative for dysuria. Musculoskeletal: Negative for back pain. Skin: Negative for rash. Neurological: Positive for syncope.  Negative for headaches, focal weakness or numbness.   ____________________________________________   PHYSICAL EXAM:  VITAL SIGNS: ED Triage Vitals  Enc Vitals Group     BP      Pulse      Resp      Temp      Temp src      SpO2      Weight      Height      Head Circumference      Peak Flow      Pain Score      Pain Loc      Pain Edu?      Excl. in GC?     Constitutional: Alert and oriented.  Ill appearing and in moderate acute distress. Eyes: Conjunctivae are normal. PERRL. EOMI. Head: Atraumatic. Nose: No congestion/rhinnorhea. Mouth/Throat: Mucous membranes are  moist.  Oropharynx non-erythematous. Neck: No stridor.   Cardiovascular: Tachycardic rate, regular rhythm. Grossly normal heart sounds.  Good peripheral circulation. Respiratory: Increased respiratory effort.  No retractions. Lungs with bilateral rales. Gastrointestinal: Soft and nontender. No distention. No abdominal bruits. No CVA tenderness. Musculoskeletal: No lower extremity tenderness nor edema.  No joint effusions. Neurologic:  Normal speech and language. No gross focal neurologic deficits are appreciated.  Skin:  Skin is warm, dry and intact. No rash noted. Psychiatric: Mood and affect are normal. Speech and behavior are  normal.  ____________________________________________   LABS (all labs ordered are listed, but only abnormal results are displayed)  Labs Reviewed  CBC WITH DIFFERENTIAL/PLATELET - Abnormal; Notable for the following components:      Result Value   RBC 6.33 (*)    MCV 78.7 (*)    MCH 24.2 (*)    All other components within normal limits  LACTIC ACID, PLASMA - Abnormal; Notable for the following components:   Lactic Acid, Venous 6.0 (*)    All other components within normal limits  COMPREHENSIVE METABOLIC PANEL - Abnormal; Notable for the following components:   Sodium 132 (*)    Chloride 94 (*)    CO2 20 (*)    Glucose, Bld 343 (*)    BUN 26 (*)    Creatinine, Ser 1.99 (*)    Calcium 8.2 (*)    Total Protein 8.3 (*)    AST 69 (*)    ALT 50 (*)    GFR calc non Af Amer 35 (*)    GFR calc Af Amer 40 (*)    Anion gap 18 (*)    All other components within normal limits  BRAIN NATRIURETIC PEPTIDE - Abnormal; Notable for the following components:   B Natriuretic Peptide 161.0 (*)    All other components within normal limits  FIBRIN DERIVATIVES D-DIMER ([facility] ONLY) - Abnormal; Notable for the following components:   Fibrin derivatives D-dimer ([facility]) 2,242.86 (*)    All other components within normal limits  LACTATE DEHYDROGENASE - Abnormal; Notable for the following components:   LDH 343 (*)    All other components within normal limits  FERRITIN - Abnormal; Notable for the following components:   Ferritin 402 (*)    All other components within normal limits  TRIGLYCERIDES - Abnormal; Notable for the following components:   Triglycerides 155 (*)    All other components within normal limits  FIBRINOGEN - Abnormal; Notable for the following components:   Fibrinogen 554 (*)    All other components within normal limits  BLOOD GAS, ARTERIAL - Abnormal; Notable for the following components:   pH, Arterial 7.34 (*)    pO2, Arterial 69 (*)    Acid-base deficit 5.2 (*)    All other  components within normal limits  POC SARS CORONAVIRUS 2 AG - Abnormal; Notable for the following components:   SARS Coronavirus 2 Ag POSITIVE (*)    All other components within normal limits  TROPONIN I (HIGH SENSITIVITY) - Abnormal; Notable for the following components:   Troponin I (High Sensitivity) 85 (*)    All other components within normal limits  TROPONIN I (HIGH SENSITIVITY) - Abnormal; Notable for the following components:   Troponin I (High Sensitivity) 124 (*)    All other components within normal limits  CULTURE, BLOOD (ROUTINE X 2)  CULTURE, BLOOD (ROUTINE X 2)  URINE CULTURE  ETHANOL  PROCALCITONIN  URINALYSIS, COMPLETE (UACMP) WITH MICROSCOPIC  C-REACTIVE PROTEIN  POC  SARS CORONAVIRUS 2 AG -  ED   ____________________________________________  EKG  ED ECG REPORT I, Brett Soza J, the attending physician, personally viewed and interpreted this ECG.   Date: 01/15/2019  EKG Time: 0250  Rate: 130  Rhythm: sinus tachycardia  Axis: Normal  Intervals:none  ST&T Change: Nonspecific  ____________________________________________  RADIOLOGY  ED MD interpretation: R>L airspace opacities  Official radiology report(s): Dg Chest Port 1 View  Result Date: 01/15/2019 CLINICAL DATA:  Shortness of breath. EXAM: PORTABLE CHEST 1 VIEW COMPARISON:  None. FINDINGS: The heart is enlarged. Patchy right greater than left airspace opacities in the lung bases. No pulmonary edema. No definite pleural effusion. No pneumothorax. No acute osseous abnormalities are seen. IMPRESSION: 1. Right greater than left basilar airspace opacities, suspicious for pneumonia. 2. Mild cardiomegaly. Electronically Signed   By: Keith Rake M.D.   On: 01/15/2019 03:19    ____________________________________________   PROCEDURES  Procedure(s) performed (including Critical Care):  Procedures  CRITICAL CARE Performed by: Paulette Blanch   Total critical care time: 45 minutes  Critical care time  was exclusive of separately billable procedures and treating other patients.  Critical care was necessary to treat or prevent imminent or life-threatening deterioration.  Critical care was time spent personally by me on the following activities: development of treatment plan with patient and/or surrogate as well as nursing, discussions with consultants, evaluation of patient's response to treatment, examination of patient, obtaining history from patient or surrogate, ordering and performing treatments and interventions, ordering and review of laboratory studies, ordering and review of radiographic studies, pulse oximetry and re-evaluation of patient's condition. ____________________________________________   INITIAL IMPRESSION / ASSESSMENT AND PLAN / ED COURSE  As part of my medical decision making, I reviewed the following data within the [place] notes reviewed and incorporated, Labs reviewed, EKG interpreted, Old chart reviewed, Radiograph reviewed and Notes from prior ED visits     Austin Shannon was evaluated in Emergency Department on 01/15/2019 for the symptoms described in the history of present illness. He was evaluated in the context of the global COVID-19 pandemic, which necessitated consideration that the patient might be at risk for infection with the SARS-CoV-2 virus that causes COVID-19. Institutional protocols and algorithms that pertain to the evaluation of patients at risk for COVID-19 are in a state of rapid change based on information released by regulatory bodies including the CDC and federal and state organizations. These policies and algorithms were followed during the patient's care in the ED.    63 year old male who presents with syncope in the setting of cough and shortness of breath; long-distance truck driver. Differential includes, but is not limited to, viral syndrome, bronchitis including COPD exacerbation, pneumonia, reactive airway disease  including asthma, CHF including exacerbation with or without pulmonary/interstitial edema, pneumothorax, ACS, thoracic trauma, and pulmonary embolism.  Core temperature > 101 F.  Code sepsis initiated.  Will draw labs for Covid and septic work-up.  Rapid Covid antigen swab will be obtained.  Give Tylenol, initial  1 L IV fluids pending initial lactic acid and Covid result.  Sats on NRB 95%.   Clinical Course as of Jan 14 706  Wed Jan 15, 2019  0160 Rapid Covid antigen is POSITIVE.  IV fluids held.  Will order IV Decadron and remdesivir.   [JS]  0335 Lactate greater than 6.0.  Will reinitiate IV fluids.   [JS]  1093 Awaiting call back from [facility] for transfer.  Patient asleep; less tachypneic.   [JS]  0423 Accepted by Dr. Thomes Dinning to [facility].  Recommends holding antibiotics until procalcitonin returns.  Patient updated and aware of plan.   [JS]  0458 Procalcitonin noted.  Will finish IV antibiotics.   [JS]  0459 Sepsis - Repeat Assessment Performed at: 0500 Vitals       Heart:     Tachycardic Lungs:     Rales       [JS]  0620 HR 116, sats 96% on NRB, BP 120/81   [JS]  0704 Patient has a bed at [facility]; awaiting transport which should be shortly after 7 AM.   [JS]    Clinical Course User Index [JS] Irean Hong, MD     ____________________________________________   FINAL CLINICAL IMPRESSION(S) / ED DIAGNOSES  Final diagnoses:  Fever, unspecified fever cause  Respiratory distress  COVID-19  Syncope, unspecified syncope type  Hyperglycemia  AKI (acute kidney injury) [facility])     ED Discharge Orders    None       Note:  This document was prepared using Dragon voice recognition software and may include unintentional dictation errors.   Irean Hong, MD 01/15/19 [phone]

## 2019-01-15 NOTE — Progress Notes (Signed)
Pharmacy Antibiotic Note  Austin Shannon is a 63 y.o. male admitted on 01/15/2019 with respiratory distress and SARS-CoV-2 infection.  Pharmacy has been consulted for Unasyn dosing for possible aspiration PNA.  Scr 1.99, wt 128.4 kg, ht pending, CrCl > 30 ml/min   Plan: Unasyn 3 g iv q 6 h.  F/U renal function, cultures, plans for antibiotics      Temp (24hrs), Avg:100 F (37.8 C), Min:98.7 F (37.1 C), Max:101.3 F (38.5 C)  Recent Labs  Lab 01/15/19 0257  WBC 8.4  CREATININE 1.99*  LATICACIDVEN 6.0*    CrCl cannot be calculated (Unknown ideal weight.).    No Known Allergies   Thank you for allowing pharmacy to be a part of this patient's care.  Ulice Dash D 01/15/2019 11:17 AM

## 2019-01-15 NOTE — ED Notes (Signed)
Pt gave verbal permission to update wife.

## 2019-01-15 NOTE — Progress Notes (Signed)
Notified provider of need to order lactic acid and fluid bolus.

## 2019-01-15 NOTE — ED Triage Notes (Signed)
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 Per EMS, pt was lethargic and had O2 of 82% on RA. Unknown down time from LOC. Pt was able to answer simple questions of DOB and name. MD at bedside. Pt on non-re breather on arrival to ED.

## 2019-01-15 NOTE — Plan of Care (Signed)
  Problem: Education: Goal: Knowledge of risk factors and measures for prevention of condition will improve Outcome: Progressing   Problem: Coping: Goal: Psychosocial and spiritual needs will be supported Outcome: Progressing   Problem: Respiratory: Goal: Will maintain a patent airway Outcome: Progressing Goal: Complications related to the disease process, condition or treatment will be avoided or minimized Outcome: Progressing   

## 2019-01-15 NOTE — ED Notes (Addendum)
Pt unable to sign consent for transfer to [facility].  Pt disoriented to place. Is oriented to year. Wife mariem informed of transfer.

## 2019-01-15 NOTE — Progress Notes (Signed)
SLP Cancellation Note  Patient Details Name: Austin Shannon MRN: [id number] DOB: 11-11-1955   Cancelled treatment:       Reason Eval/Treat Not Completed: Patient not medically ready. High RR on non rebreather, now NPO. Will f/u for readiness.    Toris Laverdiere, Katherene Ponto 01/15/2019, 3:41 PM

## 2019-01-15 NOTE — Progress Notes (Signed)
CODE SEPSIS - PHARMACY COMMUNICATION  **Broad Spectrum Antibiotics should be administered within 1 hour of Sepsis diagnosis**  Time Code Sepsis Called/Page Received: 0253  Antibiotics Ordered: Rocephin and Zithromax  Time of 1st antibiotic administration: 0339 (Rocephin)  Additional action taken by pharmacy: n/a   If necessary, Name of Provider/Nurse Contacted: n/a    Austin Shannon ,PharmD Clinical Pharmacist  01/15/2019  3:43 AM

## 2019-01-16 ENCOUNTER — Other Ambulatory Visit: Payer: Self-pay

## 2019-01-16 ENCOUNTER — Inpatient Hospital Stay (HOSPITAL_COMMUNITY): Payer: Self-pay

## 2019-01-16 DIAGNOSIS — R079 Chest pain, unspecified: Secondary | ICD-10-CM

## 2019-01-16 LAB — CBC WITH DIFFERENTIAL/PLATELET
Abs Immature Granulocytes: 0.03 10*3/uL (ref 0.00–0.07)
Basophils Absolute: 0.1 10*3/uL (ref 0.0–0.1)
Basophils Relative: 1 %
Eosinophils Absolute: 0 10*3/uL (ref 0.0–0.5)
Eosinophils Relative: 0 %
HCT: 45.4 % (ref 39.0–52.0)
Hemoglobin: 13.8 g/dL (ref 13.0–17.0)
Immature Granulocytes: 0 %
Lymphocytes Relative: 13 %
Lymphs Abs: 0.9 10*3/uL (ref 0.7–4.0)
MCH: 24.4 pg — ABNORMAL LOW (ref 26.0–34.0)
MCHC: 30.4 g/dL (ref 30.0–36.0)
MCV: 80.2 fL (ref 80.0–100.0)
Monocytes Absolute: 0.5 10*3/uL (ref 0.1–1.0)
Monocytes Relative: 7 %
Neutro Abs: 5.6 10*3/uL (ref 1.7–7.7)
Neutrophils Relative %: 79 %
Platelets: 192 10*3/uL (ref 150–400)
RBC: 5.66 MIL/uL (ref 4.22–5.81)
RDW: 15.1 % (ref 11.5–15.5)
WBC: 7.1 10*3/uL (ref 4.0–10.5)
nRBC: 0 % (ref 0.0–0.2)

## 2019-01-16 LAB — GLUCOSE, CAPILLARY
Glucose-Capillary: 170 mg/dL — ABNORMAL HIGH (ref 70–99)
Glucose-Capillary: 171 mg/dL — ABNORMAL HIGH (ref 70–99)
Glucose-Capillary: 202 mg/dL — ABNORMAL HIGH (ref 70–99)
Glucose-Capillary: 261 mg/dL — ABNORMAL HIGH (ref 70–99)
Glucose-Capillary: 309 mg/dL — ABNORMAL HIGH (ref 70–99)
Glucose-Capillary: 309 mg/dL — ABNORMAL HIGH (ref 70–99)
Glucose-Capillary: 218 mg/dL — ABNORMAL HIGH (ref 70–99)

## 2019-01-16 LAB — COMPREHENSIVE METABOLIC PANEL
ALT: 48 U/L — ABNORMAL HIGH (ref 0–44)
AST: 104 U/L — ABNORMAL HIGH (ref 15–41)
Albumin: 3.3 g/dL — ABNORMAL LOW (ref 3.5–5.0)
Alkaline Phosphatase: 44 U/L (ref 38–126)
Anion gap: 9 (ref 5–15)
BUN: 36 mg/dL — ABNORMAL HIGH (ref 8–23)
CO2: 28 mmol/L (ref 22–32)
Calcium: 8.1 mg/dL — ABNORMAL LOW (ref 8.9–10.3)
Chloride: 103 mmol/L (ref 98–111)
Creatinine, Ser: 1.86 mg/dL — ABNORMAL HIGH (ref 0.61–1.24)
GFR calc Af Amer: 44 mL/min — ABNORMAL LOW (ref >60)
GFR calc non Af Amer: 38 mL/min — ABNORMAL LOW (ref >60)
Glucose, Bld: 202 mg/dL — ABNORMAL HIGH (ref 70–99)
Potassium: 4.1 mmol/L (ref 3.5–5.1)
Sodium: 140 mmol/L (ref 135–145)
Total Bilirubin: 0.6 mg/dL (ref 0.3–1.2)
Total Protein: 7.2 g/dL (ref 6.5–8.1)

## 2019-01-16 LAB — URINE CULTURE
Culture: NO GROWTH
Culture: NO GROWTH

## 2019-01-16 LAB — C-REACTIVE PROTEIN: CRP: 43.8 mg/dL — ABNORMAL HIGH (ref <1.0)

## 2019-01-16 LAB — PROCALCITONIN: Procalcitonin: 96.21 ng/mL

## 2019-01-16 LAB — LACTIC ACID, PLASMA: Lactic Acid, Venous: 1.7 mmol/L (ref 0.5–1.9)

## 2019-01-16 LAB — MAGNESIUM: Magnesium: 2.2 mg/dL (ref 1.7–2.4)

## 2019-01-16 LAB — CK: Total CK: 8731 U/L — ABNORMAL HIGH (ref 49–397)

## 2019-01-16 LAB — ECHOCARDIOGRAM COMPLETE: Weight: 4529.13 oz

## 2019-01-16 LAB — D-DIMER, QUANTITATIVE: D-Dimer, Quant: 5.04 ug/mL-FEU — ABNORMAL HIGH (ref 0.00–0.50)

## 2019-01-16 LAB — BRAIN NATRIURETIC PEPTIDE: B Natriuretic Peptide: 61 pg/mL (ref 0.0–100.0)

## 2019-01-16 MED ORDER — CHLORHEXIDINE GLUCONATE CLOTH 2 % EX PADS
6.0000 | MEDICATED_PAD | Freq: Every day | CUTANEOUS | Status: DC
  Administered 2019-01-16 – 2019-01-28 (×9): 6 via TOPICAL

## 2019-01-16 MED ORDER — MENTHOL 3 MG MT LOZG
1.0000 | LOZENGE | OROMUCOSAL | Status: DC | PRN
  Administered 2019-01-16: 3 mg via ORAL
  Filled 2019-01-16 (×2): qty 9

## 2019-01-16 MED ORDER — LACTATED RINGERS IV SOLN
INTRAVENOUS | Status: DC
  Administered 2019-01-16 – 2019-01-17 (×2): via INTRAVENOUS

## 2019-01-16 MED ORDER — ENOXAPARIN SODIUM 80 MG/0.8ML ~~LOC~~ SOLN
0.5000 mg/kg | Freq: Two times a day (BID) | SUBCUTANEOUS | Status: AC
  Administered 2019-01-16 – 2019-01-18 (×6): 65 mg via SUBCUTANEOUS
  Filled 2019-01-16 (×6): qty 0.8

## 2019-01-16 MED ORDER — METHYLPREDNISOLONE SODIUM SUCC 125 MG IJ SOLR
60.0000 mg | Freq: Two times a day (BID) | INTRAMUSCULAR | Status: DC
  Administered 2019-01-16 – 2019-01-18 (×5): 60 mg via INTRAVENOUS
  Filled 2019-01-16 (×5): qty 2

## 2019-01-16 MED ORDER — ASPIRIN 300 MG RE SUPP
300.0000 mg | Freq: Every day | RECTAL | Status: DC
  Administered 2019-01-16: 300 mg via RECTAL
  Filled 2019-01-16: qty 1

## 2019-01-16 MED ORDER — ASPIRIN 81 MG PO CHEW
81.0000 mg | CHEWABLE_TABLET | Freq: Every day | ORAL | Status: DC
  Administered 2019-01-16 – 2019-01-20 (×5): 81 mg via ORAL
  Filled 2019-01-16 (×6): qty 1

## 2019-01-16 MED ORDER — PANTOPRAZOLE SODIUM 40 MG IV SOLR
40.0000 mg | Freq: Two times a day (BID) | INTRAVENOUS | Status: DC
  Administered 2019-01-16 – 2019-01-20 (×9): 40 mg via INTRAVENOUS
  Filled 2019-01-16 (×9): qty 40

## 2019-01-16 NOTE — Evaluation (Signed)
Occupational Therapy Evaluation Patient Details Name: Austin Shannon MRN: [id number] DOB: Apr 23, 1955 Today's Date: 01/16/2019    History of Present Illness Pt is a 63 y.o. male admitted 01/15/19 with weakness and falls, found by EMS on floor and hypoxic; found to be in septic shock with (+) COVID-19 PNA, ARF, AMS. PMH includes HTN, DM.   Clinical Impression   Prior to admission, Pt was very independent in ADL/IADL and mobility/transfers. Pt works full time as a Warehouse manager. Today Pt seems slow to respond to questions but does answer them seeminly accurately. Will continue to assess cognition during course of stay. Pt was mod A for bed mobility, initially very woozy sitting EOB, BP WFL and able to progress to min guard sitting with UE support. Then able to pivot to recliner with mod A for initial boost from bed +2 for safety, once upright BP stable and balance assist. Pt initially on 10L O2 via HFNC and able to maintain saturations >92% throughout. Left Pt in recliner on 9L O2 via HFNC. Pt will benefit from skilled OT in the acute setting and afterwards at the [facility] level to maximize safety and independence in ADL and functional transfers - with a special focus on safety, energy conservation, and DME/AE education.    Follow Up Recommendations  Home health OT    Equipment Recommendations  3 in 1 bedside commode    Recommendations for Other Services       Precautions / Restrictions Restrictions Weight Bearing Restrictions: No      Mobility Bed Mobility Overal bed mobility: Needs Assistance Bed Mobility: Supine to Sit     Supine to sit: Mod assist;+2 for safety/equipment;HOB elevated     General bed mobility comments: Pt used therapist hand for stability and pulled up trunk, use of pad to bring hips EOB, Pt initially unbalanced using rail for support - able to progress to min guard sitting EOB  Transfers Overall transfer level: Needs assistance Equipment used: 2 person  hand held assist Transfers: Sit to/from [employer] Sit to Stand: Mod assist;+2 safety/equipment Stand pivot transfers: Min assist;+2 safety/equipment       General transfer comment: assist for boost and balance, cues for safe hand placement    Balance Overall balance assessment: Needs assistance Sitting-balance support: Single extremity supported;Bilateral upper extremity supported;Feet supported Sitting balance-Leahy Scale: Poor(progressed to fair) Sitting balance - Comments: initially min A for seated balance, able to progress to min guard   Standing balance support: Bilateral upper extremity supported Standing balance-Leahy Scale: Poor Standing balance comment: dependent on external assist for balance                           ADL either performed or assessed with clinical judgement   ADL Overall ADL's : Needs assistance/impaired Eating/Feeding: NPO(eager to eat)   Grooming: Set up;Sitting;Wash/dry face Grooming Details (indicate cue type and reason): in recliner Upper Body Bathing: Sitting;Minimal assistance   Lower Body Bathing: Moderate assistance;Sitting/lateral leans   Upper Body Dressing : Minimal assistance;Sitting   Lower Body Dressing: Moderate assistance;Sitting/lateral leans   Toilet Transfer: +2 for safety/equipment;Moderate assistance;Stand-pivot   Toileting- Clothing Manipulation and Hygiene: Moderate assistance Toileting - Clothing Manipulation Details (indicate cue type and reason): line management for catheter     Functional mobility during ADLs: Minimal assistance;+2 for safety/equipment(face to face) General ADL Comments: decreased activity tolerance, continue to assess cognition     Vision  Perception     Praxis      Pertinent Vitals/Pain Pain Assessment: No/denies pain     Hand Dominance Right   Extremity/Trunk Assessment Upper Extremity Assessment Upper Extremity Assessment: Generalized weakness    Lower Extremity Assessment Lower Extremity Assessment: Generalized weakness   Cervical / Trunk Assessment Cervical / Trunk Assessment: Normal   Communication Communication Communication: No difficulties(wet voice)   Cognition Arousal/Alertness: Awake/alert Behavior During Therapy: WFL for tasks assessed/performed Overall Cognitive Status: Impaired/Different from baseline Area of Impairment: Problem solving                             Problem Solving: Slow processing General Comments: Pt able to answer all questions adequately, just took a little longer than normal   General Comments  Pt decreased from 10L HFNC to 9L HFNC SpO2 >92% throughout session    Exercises     Shoulder Instructions      Home Living Family/patient expects to be discharged to:: Private residence Living Arrangements: Spouse/significant other;Children(10, 14, 16) Available Help at Discharge: Family;Available 24 hours/day Type of Home: House Home Access: Stairs to enter [employer] of Steps: 2 Entrance Stairs-Rails: None Home Layout: One level     Bathroom Shower/Tub: Tub/shower unit;Walk-in shower   Bathroom Toilet: Standard     Home Equipment: None          Prior Functioning/Environment Level of Independence: Independent        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, truck driver        OT Problem List: Decreased strength;Decreased activity tolerance;Impaired balance (sitting and/or standing);Decreased cognition;Decreased safety awareness;Decreased knowledge of use of DME or AE;Cardiopulmonary status limiting activity;Obesity;Impaired UE functional use      OT Treatment/Interventions: Self-care/ADL training;Therapeutic exercise;Energy conservation;DME and/or AE instruction;Therapeutic activities;Patient/family education;Balance training    OT Goals(Current goals can be found in the care plan section) Acute Rehab OT Goals Patient Stated Goal: Get a new job OT  Goal Formulation: With patient Time For Goal Achievement: 01/30/19 Potential to Achieve Goals: Good ADL Goals Pt Will Perform Grooming: with supervision;standing Pt Will Perform Upper Body Bathing: with modified independence;sitting Pt Will Perform Lower Body Bathing: with set-up;sitting/lateral leans;with adaptive equipment Pt Will Transfer to Toilet: with supervision;ambulating Pt Will Perform Toileting - Clothing Manipulation and hygiene: with supervision;sit to/from stand Pt/caregiver will Perform Home Exercise Program: Both right and left upper extremity;With theraband;With written HEP provided Additional ADL Goal #1: Pt will recall 3 ways of conserving energy during ADL routine with no cues  OT Frequency: Min 2X/week   Barriers to D/C:            Co-evaluation PT/OT/SLP Co-Evaluation/Treatment: Yes Reason for Co-Treatment: For patient/therapist safety;To address functional/ADL transfers PT goals addressed during session: Mobility/safety with mobility;Balance OT goals addressed during session: ADL's and self-care      AM-PAC OT "6 Clicks" Daily Activity     Outcome Measure Help from another person eating meals?: Total(NPO) Help from another person taking care of personal grooming?: A Little Help from another person toileting, which includes using toliet, bedpan, or urinal?: A Lot Help from another person bathing (including washing, rinsing, drying)?: A Lot Help from another person to put on and taking off regular upper body clothing?: A Little Help from another person to put on and taking off regular lower body clothing?: A Lot 6 Click Score: 13   End of Session Equipment Utilized During Treatment: Oxygen(10-9L vis HFNC) Nurse Communication: Mobility status  Activity Tolerance: Patient tolerated treatment well Patient left: in chair;with call bell/phone within reach;with nursing/sitter in room  OT Visit Diagnosis: Unsteadiness on feet (R26.81);Other abnormalities of gait  and mobility (R26.89);History of falling (Z91.81);Muscle weakness (generalized) (M62.81);Other symptoms and signs involving cognitive function                Time: [id number] OT Time Calculation (min): 23 min Charges:  OT General Charges $OT Visit: 1 Visit OT Evaluation $OT Eval Moderate Complexity: 1 Mod  Sherryl MangesLaura Kaedon Fanelli OTR/L Acute Rehabilitation Services Pager: [phone] Office: [phone]  Evern BioLaura J Katee Wentland 01/16/2019, 10:03 AM

## 2019-01-16 NOTE — Progress Notes (Signed)
PROGRESS NOTE                                                                                                                                                                                                             Patient Demographics:    Austin Shannon, is a 63 y.o. male, DOB - Nov 19, 1955, XBM:[id number]  Outpatient Primary MD for the patient is Patient, No Pcp Per    LOS - 1  Admit date - 01/15/2019    CC - AMS     Brief Narrative -  Austin Shannon  is a 63 y.o. 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   Subjective:    Austin Shannon today has, No headache, No chest pain, No abdominal pain - No Nausea, No new weakness tingling or numbness, complains of mild sore throat, improved shortness of breath, does have cough.   Assessment  & Plan :     1.  Sepsis, septic shock, COVID-19 pneumonia, with concurrent bacterial infection.    He had extremely high procalcitonin in the range of 100, elevated lactic acid, elevated creatinine and high CRP.  He also has history of treated TB in the past.  He was started on IV remdesivir, IV steroids, IV empiric antibiotics.  Clinically he has improved, noncontrast CT neck, chest, abdomen and pelvis unremarkable.  There is question if he is aspirated as he has some gurgling on exam for which speech has been consulted.  Currently n.p.o.  Continue supportive care.  Still quite sick and tenuous but better than he was  at the time of admission.    Continue to monitor blood cultures.   SpO2:  402*  --   --   BNP 161.0* 83.4 61.0  PROCALCITON 74.80 111.36 96.21    Hepatic Function Latest Ref Rng & Units 01/16/2019 01/15/2019 01/15/2019  Total Protein 6.5 - 8.1 g/dL 7.2 8.0 8.3(H)  Albumin 3.5 - 5.0 g/dL 3.3(L) 3.9 4.3  AST 15 - 41 U/L 104(H) 111(H) 69(H)  ALT 0 - 44 U/L 48(H) 55(H) 50(H)  Alk Phosphatase 38 - 126 U/L 44 50 63  Total Bilirubin 0.3 - 1.2 mg/dL 0.6 0.5 0.6    2.  Dehydration, rhabdomyolysis with AKI and hyperkalemia.  Continue Foley for now, CT scan nonacute, improving with hydration and Kayexalate, trend CK.  3.  Transaminitis.  Due to COVID-19 along with rhabdomyolysis, symptom-free no abdominal pain.  CT abdomen pelvis nonacute.  Symptom-free will monitor.  4.  Dyslipidemia.  Home dose statin once able to swallow.  5.  DM type II.  Check A1c, placed on Lantus and sliding scale every 4.  5.  Toxic encephalopathy.  Improving with supportive care no focal deficits.  6.  Borderline elevated troponin.  Not an ACS pattern, chest pain-free, EKG nonacute, aspirin suppository, low-dose beta-blocker once taking orally and get echocardiogram.  7.  Hypertension.  Since he is n.p.o. for now Catapres patch along with as needed pushes of beta-blocker.  8.  Dysphagia and odynophagia.  Soft tissue neck CT unremarkable, could not add contrast due to renal failure, supportive care with antibiotics, will check strep throat.  Speech to evaluate.       Condition - Extremely Guarded  Family Communication  :  Wife 12/10   Code Status :  DNR  Diet :   Diet Order            Diet NPO time specified  Diet effective now               Disposition Plan  :   PCU  Consults  :     Procedures  :    CT soft tissue neck, chest  abdomen pelvis.  All noncontrast due to AKI.  Nonacute except for bilateral pneumonia.  PUD Prophylaxis : PPI  DVT Prophylaxis  :    Switch to high-dose prophylactic Lovenox on 01/16/2019  Lab Results  Component Value Date   PLT 192 01/16/2019    Inpatient Medications  Scheduled Meds:  amLODipine  10 mg Oral Daily   aspirin  300 mg Rectal Daily   carvedilol  6.25 mg Oral BID   Chlorhexidine Gluconate Cloth  6 each Topical Daily   cloNIDine  0.2 mg Transdermal Weekly   docusate sodium  100 mg Oral BID   heparin  7,500 Units Subcutaneous Q8H   insulin aspart  0-20 Units Subcutaneous Q4H   insulin aspart  3 Units Subcutaneous TID WC   insulin glargine  15 Units Subcutaneous Once   insulin glargine  35 Units Subcutaneous Daily   methylPREDNISolone (SOLU-MEDROL) injection  60 mg Intravenous Q12H   sodium polystyrene  30 g Oral Once   Continuous Infusions:  ampicillin-sulbactam (UNASYN) IV 3 g (01/16/19 0528)   lactated ringers 75 mL/hr at 01/16/19 0807   remdesivir 100 mg in NS 100 mL 100 mg (01/16/19 0927)   PRN Meds:.acetaminophen, acetaminophen, metoprolol tartrate, [DISCONTINUED] ondansetron **OR** ondansetron (ZOFRAN) IV  Antibiotics  :    Anti-infectives (From admission, onward)   Start     Dose/Rate Route Frequency Ordered Stop   01/16/19 1000  remdesivir 100 mg in sodium chloride 0.9 % 100  mL IVPB  Status:  Discontinued     100 mg 200 mL/hr over 30 Minutes Intravenous Daily 01/15/19 1104 01/15/19 1107   01/16/19 1000  remdesivir 100 mg in sodium chloride 0.9 % 100 mL IVPB     100 mg 200 mL/hr over 30 Minutes Intravenous Daily 01/15/19 1114 01/20/19 0959   01/15/19 1200  remdesivir 200 mg in sodium chloride 0.9% 250 mL IVPB  Status:  Discontinued     200 mg 580 mL/hr over 30 Minutes Intravenous Once 01/15/19 1104 01/15/19 1107   01/15/19 1200  Ampicillin-Sulbactam (UNASYN) 3 g in sodium chloride 0.9 % 100 mL IVPB     3 g 200 mL/hr over 30 Minutes  Intravenous Every 6 hours 01/15/19 1107         Time Spent in minutes  30   Lala Lund M.D on 01/16/2019 at 10:36 AM  To page go to [email or web address] - password [facility]  [facility] -  Office  [phone]  See all Orders from today for further details    Objective:   Vitals:   01/16/19 0230 01/16/19 0400 01/16/19 0841 01/16/19 0918  BP: (!) 142/101 (!) 153/92 (!) 151/91 (!) 155/98  Pulse: (!) 104 (!) 103 (!) 107 (!) 110  Resp: 20 (!) 27 (!) 24   Temp:  98.4 F (36.9 C)    TempSrc:  Oral Oral   SpO2: 98% 92% 98%   Weight:        Wt Readings from Last 3 Encounters:  01/15/19 128.4 kg  01/15/19 128.4 kg     Intake/Output Summary (Last 24 hours) at 01/16/2019 1036 Last data filed at 01/16/2019 0400 Gross per 24 hour  Intake 1490.93 ml  Output 1001 ml  Net 489.93 ml     Physical Exam  Awake Alert, does have generalized weakness, no new F.N deficits, Normal affect [place].AT,PERRAL Supple Neck,No JVD, No cervical lymphadenopathy appriciated.  Symmetrical Chest wall movement, Good air movement bilaterally, coarse bilateral breath sounds RRR,No Gallops,Rubs or new Murmurs, No Parasternal Heave +ve B.Sounds, Abd Soft, No tenderness, No organomegaly appriciated, No rebound - guarding or rigidity. No Cyanosis, Clubbing or edema, No new Rash or bruise       Data Review:    CBC Recent Labs  Lab 01/15/19 0257 01/15/19 1136 01/16/19 0445  WBC 8.4 6.6 7.1  HGB 15.3 15.0 13.8  HCT 49.8 49.6 45.4  PLT 236 207 192  MCV 78.7* 80.5 80.2  MCH 24.2* 24.4* 24.4*  MCHC 30.7 30.2 30.4  RDW 15.1 15.1 15.1  LYMPHSABS 1.4 1.1 0.9  MONOABS 0.6 0.4 0.5  EOSABS 0.1 0.0 0.0  BASOSABS 0.1 0.1 0.1    Chemistries  Recent Labs  Lab 01/15/19 0257 01/15/19 1136 01/16/19 0445  NA 132* 135 140  K 4.8 5.9* 4.1  CL 94* 97* 103  CO2 20* 26 28  GLUCOSE 343* 296* 202*  BUN 26* 34* 36*  CREATININE 1.99* 2.31* 1.86*  CALCIUM 8.2* 8.0* 8.1*  MG  --   --  2.2  AST 69* 111*  104*  ALT 50* 55* 48*  ALKPHOS 63 50 44  BILITOT 0.6 0.5 0.6   ------------------------------------------------------------------------------------------------------------------ Recent Labs    01/15/19 0257  TRIG 155*    Lab Results  Component Value Date   HGBA1C 8.2 (H) 01/15/2019   ------------------------------------------------------------------------------------------------------------------ No results for input(s): TSH, T4TOTAL, T3FREE, THYROIDAB in the last 72 hours.  Invalid input(s): FREET3  Cardiac Enzymes No results for input(s): CKMB, TROPONINI, MYOGLOBIN in the  No results for input(s): TSH, T4TOTAL, T3FREE, THYROIDAB in the last 72 hours.  Invalid input(s): FREET3  Cardiac Enzymes No results for input(s): CKMB, TROPONINI, MYOGLOBIN in the last 168 hours.  Invalid input(s): CK ------------------------------------------------------------------------------------------------------------------    Component Value Date/Time   BNP 61.0 01/16/2019 0445    Micro Results Recent Results (from the past 240 hour(s))  Culture, blood (routine x 2)     Status: None (Preliminary result)   Collection Time: 01/15/19  2:57 AM   Specimen: BLOOD  Result Value Ref Range Status   Specimen Description BLOOD RIGHT ANTECUBITAL  Final   Special Requests   Final    BOTTLES DRAWN AEROBIC AND ANAEROBIC Blood Culture results may not be optimal due to an excessive volume of blood received in culture bottles   Culture   Final    NO GROWTH 1 DAY Performed at [facility], [address], [place], [place] [address]    Report Status PENDING  Incomplete  Culture, blood (routine x 2)     Status: None (Preliminary result)   Collection Time: 01/15/19  2:57 AM   Specimen: BLOOD  Result Value Ref Range Status   Specimen Description BLOOD BLOOD LEFT HAND  Final   Special Requests   Final    BOTTLES DRAWN AEROBIC AND ANAEROBIC Blood Culture results may not be optimal due to an inadequate volume of blood received in culture bottles   Culture   Final    NO GROWTH 1 DAY Performed at [facility], [address] [place], [place] [address]    Report  Status PENDING  Incomplete  Urine culture     Status: None   Collection Time: 01/15/19  9:41 AM   Specimen: Urine, Random  Result Value Ref Range Status   Specimen Description   Final    URINE, RANDOM Performed at [facility], [address], [place], [place] [address]    Special Requests   Final    NONE Performed at [facility], [address] [place], [place] [address]    Culture   Final    NO GROWTH Performed at [facility], [place] [address] [place], [place] [address]    Report Status 01/16/2019 FINAL  Final  Culture, respiratory     Status: None (Preliminary result)   Collection Time: 01/15/19  6:45 PM   Specimen: Tracheal Aspirate  Result Value Ref Range Status   Specimen Description   Final    TRACHEAL ASPIRATE Performed at [place] [address], [place], [place] [address]    Special Requests   Final    NONE Performed at [facility], [place] [address] [place],  [address]    Gram Stain   Final    RARE WBC PRESENT, PREDOMINANTLY PMN ABUNDANT GRAM POSITIVE COCCI IN PAIRS IN CLUSTERS ABUNDANT GRAM NEGATIVE RODS FEW GRAM POSITIVE RODS Performed at [facility], [place] [address] [place],  [address]    Culture PENDING  Incomplete   Report Status PENDING  Incomplete    Radiology Reports CT ABDOMEN PELVIS WO CONTRAST  Result Date: 01/15/2019 CLINICAL DATA:  Abdominal pain and fever. Abscess suspected. COVID-19 pneumonia. EXAM: CT CHEST, ABDOMEN AND PELVIS WITHOUT CONTRAST TECHNIQUE: Multidetector CT imaging of the chest, abdomen and pelvis was performed following the standard protocol without IV contrast. COMPARISON:  One-view chest x-ray 01/15/2019 FINDINGS: CT CHEST FINDINGS Cardiovascular: The heart size is normal. Scratched at the heart size upper  or axillary adenopathy is present. Esophagus is within normal limits. Thoracic inlet is normal. Lungs/Pleura: Bilateral lower lobe airspace consolidation is present. Additional patchy airspace disease is present in the superior segment of both lower lobes and in the right middle lobe. Mild patchy airspace opacities are present right upper lobe as well. Musculoskeletal: Vertebral body heights are maintained. No focal lytic or blastic lesions are present. CT ABDOMEN PELVIS FINDINGS Hepatobiliary: There is diffuse fatty infiltration of the liver. No discrete lesions are present. The common bile duct and gallbladder are within normal limits. Pancreas: Mild fatty infiltration of the pancreas is present. No discrete lesions are present. Spleen: Normal in size without focal abnormality. Adrenals/Urinary Tract: The adrenal glands are normal bilaterally. Kidneys are unremarkable. There is no stone or mass lesion. No obstruction is present. The ureters are within normal limits. The urinary bladder is decompressed with a Foley catheter in place. Stomach/Bowel: The stomach and duodenum are within limits. Small bowel is unremarkable. Terminal ileum is normal. Appendix is visualized and within limits. The ascending and transverse colon are normal. Descending and sigmoid colon are normal. Vascular/Lymphatic: Atherosclerotic changes are present in the aorta and branch vessels. Reproductive: Prostate is unremarkable. Other: No abdominal wall hernia or abnormality. No abdominopelvic ascites. Focal fluid collection present to suggest abscess Musculoskeletal: Endplate changes are present at L5-S1. Vertebral body heights are maintained. There is some straightening of lumbar lordosis. Bony pelvis is within normal limits. Hips are located and within normal limits. IMPRESSION: 1. Bilateral lower lobe and right middle lobe airspace disease compatible with multifocal  pneumonia. There is likely involvement right upper lobe as well. 2. Hepatic steatosis. 3. No other acute or focal abnormality to explain the patient's abdominal pain. 4. Degenerative changes in the lower lumbar spine. 5. Aortic Atherosclerosis (ICD10-I70.0). Electronically Signed   By: San Morelle M.D.   On: 01/15/2019 16:34   CT SOFT TISSUE NECK WO CONTRAST  Result Date: 01/15/2019 CLINICAL DATA:  COVID-19 positive.  Sepsis.  Renal insufficiency EXAM: CT NECK WITHOUT CONTRAST TECHNIQUE: Multidetector CT imaging of the neck was performed following the standard protocol without intravenous contrast. COMPARISON:  None. FINDINGS: Pharynx and larynx: Image quality degraded by motion. Normal airway.  No mass or abscess in the larynx. Salivary glands: No inflammation, mass, or stone. Thyroid: Negative Lymph nodes: No enlarged lymph nodes in the neck. Vascular: Limited vascular evaluation without intravenous contrast. Limited intracranial: Negative Visualized orbits: Negative Mastoids and visualized paranasal sinuses: Paranasal sinuses clear. Small osteoma left frontal sinus. Skeleton: Cervical spondylosis.  No acute skeletal abnormality. Upper chest: Negative Other: None IMPRESSION: No acute abnormality Exam limited by motion and lack of intravenous contrast. Electronically Signed   By: Franchot Gallo M.D.   On: 01/15/2019 16:34   CT CHEST WO CONTRAST  Result Date: 01/15/2019 CLINICAL DATA:  Abdominal pain and fever. Abscess suspected. COVID-19 pneumonia. EXAM: CT CHEST, ABDOMEN AND PELVIS WITHOUT CONTRAST TECHNIQUE: Multidetector CT imaging of the chest, abdomen and pelvis was performed following the standard protocol without IV contrast. COMPARISON:  One-view chest x-ray 01/15/2019 FINDINGS: CT CHEST FINDINGS Cardiovascular: The heart size is normal. Scratched at the heart size upper limits of normal. Atherosclerotic calcifications are noted at the aortic valve and aortic arch. There is no aneurysm.  Pulmonary artery size is normal. Mediastinum/Nodes: No significant mediastinal hilar, or axillary adenopathy is present. Esophagus is within normal limits. Thoracic inlet is normal. Lungs/Pleura: Bilateral lower lobe airspace consolidation is present. Additional patchy airspace disease is present in the superior  adenopathy is present. Esophagus is within normal limits. Thoracic inlet is normal. Lungs/Pleura: Bilateral lower lobe airspace consolidation is present. Additional patchy airspace disease is present in the superior segment of both lower lobes and in the right middle lobe. Mild patchy airspace opacities are present right upper lobe as well. Musculoskeletal: Vertebral body heights are maintained. No focal lytic or blastic lesions are present. CT ABDOMEN PELVIS FINDINGS Hepatobiliary: There is diffuse fatty infiltration of the liver. No discrete lesions are present. The common bile duct and gallbladder are within normal limits. Pancreas: Mild fatty infiltration of the pancreas is present. No discrete lesions are present. Spleen: Normal in size without focal abnormality. Adrenals/Urinary Tract: The adrenal glands are normal bilaterally. Kidneys are unremarkable. There is no stone or mass lesion. No obstruction is present. The ureters are within normal limits. The urinary bladder is decompressed with a Foley catheter in place. Stomach/Bowel: The stomach and duodenum are within limits. Small bowel is unremarkable. Terminal ileum is normal. Appendix is visualized and within limits. The ascending and transverse colon are normal. Descending and sigmoid colon are normal. Vascular/Lymphatic: Atherosclerotic changes are present in the aorta and branch vessels. Reproductive: Prostate is unremarkable. Other: No abdominal wall hernia or abnormality. No abdominopelvic ascites. Focal fluid collection present to suggest abscess Musculoskeletal: Endplate changes are present at L5-S1. Vertebral body heights are maintained. There is some straightening of lumbar lordosis. Bony pelvis is within normal limits. Hips are located and within normal limits. IMPRESSION: 1. Bilateral lower lobe and right middle lobe airspace disease compatible with  multifocal pneumonia. There is likely involvement right upper lobe as well. 2. Hepatic steatosis. 3. No other acute or focal abnormality to explain the patient's abdominal pain. 4. Degenerative changes in the lower lumbar spine. 5. Aortic Atherosclerosis (ICD10-I70.0). Electronically Signed   By: San Morelle M.D.   On: 01/15/2019 16:34   CXR am  Result Date: 01/15/2019 CLINICAL DATA:  Acute shortness of breath. Follow-up COVID-19 pneumonia. EXAM: PORTABLE CHEST 1 VIEW 11:16 a.m.: COMPARISON:  Portable chest x-ray earlier same day at 2:55 a.m. FINDINGS: Cardiac silhouette enlarged for AP portable technique, unchanged. Progressive airspace consolidation involving the lower lobes and the RIGHT MIDDLE LOBE since the examination earlier this morning. Streaky opacities in the lingula. UPPER lobes remain clear otherwise. Normal pulmonary vascularity. IMPRESSION: 1. Progressive bilateral lower lobe pneumonia since the examination earlier this morning. 2. Streaky opacities in the lingula, likely atelectasis. 3. Stable cardiomegaly without pulmonary edema. Electronically Signed   By: Evangeline Dakin M.D.   On: 01/15/2019 11:26   DG Chest Port 1 View  Result Date: 01/15/2019 CLINICAL DATA:  Shortness of breath. EXAM: PORTABLE CHEST 1 VIEW COMPARISON:  None. FINDINGS: The heart is enlarged. Patchy right greater than left airspace opacities in the lung bases. No pulmonary edema. No definite pleural effusion. No pneumothorax. No acute osseous abnormalities are seen. IMPRESSION: 1. Right greater than left basilar airspace opacities, suspicious for pneumonia. 2. Mild cardiomegaly. Electronically Signed   By: Keith Rake M.D.   On: 01/15/2019 03:19

## 2019-01-16 NOTE — Progress Notes (Signed)
  Echocardiogram 2D Echocardiogram has been performed.  Darlina Sicilian M 01/16/2019, 10:46 AM

## 2019-01-16 NOTE — Evaluation (Signed)
Therapy Evaluation Patient Details Name: Austin Shannon MRN: [id number] DOB: 30-Oct-1955 Today's Date: 01/16/2019   History of Present Illness  Pt is a 63 y.o. male admitted 01/15/19 with weakness and falls, found by EMS on floor and hypoxic; found to be in septic shock with (+) COVID-19 PNA, ARF, AMS. PMH includes HTN, DM.    Clinical Impression  Pt presents with an overall decrease in functional mobility secondary to above. 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 Today, pt requiring min-modA for mobility (+2 safety), some slowed processing noted; limited by generalized weakness, fatigue and decreased activity tolerance. SpO2 >/92% on 10L HFNC, reduced to 9L at end of session. Pt would benefit from continued acute PT services to maximize functional mobility and independence prior to d/c with HHPT services.     Follow Up Recommendations Home health PT;Supervision for mobility/OOB    Equipment Recommendations  (TBD - maybe RW)    Recommendations for Other Services       Precautions / Restrictions Precautions Precautions: Fall Restrictions Weight Bearing Restrictions: No      Mobility  Bed Mobility Overal bed mobility: Needs Assistance Bed Mobility: Supine to Sit     Supine to sit: Mod assist;+2 for safety/equipment;HOB elevated     General bed mobility comments: Pt used therapist hand for stability and pulled up trunk, use of pad to bring hips EOB, Pt initially unbalanced using rail for support - able to progress to min guard sitting EOB  Transfers Overall transfer level: Needs assistance Equipment used: 2 person hand held assist Transfers: Sit to/from [facility] Sit to Stand: Mod assist;+2 safety/equipment Stand pivot transfers: Min assist;+2 safety/equipment       General transfer comment: assist for boost and balance, cues for safe hand placement  Ambulation/Gait             General Gait Details: Pivotal steps  to recliner with bilateral HHA and min-modA  Stairs            Wheelchair Mobility    Modified Rankin (Stroke Patients Only)       Balance Overall balance assessment: Needs assistance Sitting-balance support: Single extremity supported;Bilateral upper extremity supported;Feet supported Sitting balance-Leahy Scale: Fair Sitting balance - Comments: initially min A for seated balance, able to progress to min guard   Standing balance support: Bilateral upper extremity supported Standing balance-Leahy Scale: Poor Standing balance comment: dependent on external assist for balance                             Pertinent Vitals/Pain Pain Assessment: Faces Faces Pain Scale: Hurts a little bit Pain Location: Generalized Pain Descriptors / Indicators: Discomfort Pain Intervention(s): Monitored during session    Home Living Family/patient expects to be discharged to:: Private residence Living Arrangements: Spouse/significant other;Children(10, 14, 16) Available Help at Discharge: Family;Available 24 hours/day Type of Home: House Home Access: Stairs to enter Entrance Stairs-Rails: None Entrance Stairs-Number of Steps: 2 Home Layout: One level Home Equipment: None      Prior Function Level of Independence: Independent         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, truck driver     Hand Dominance   Dominant Hand: Right    Extremity/Trunk Assessment   Upper Extremity Assessment Upper Extremity Assessment: Generalized weakness    Lower Extremity Assessment Lower Extremity Assessment: Generalized weakness    Cervical / Trunk Assessment Cervical / Trunk Assessment: Normal  Communication   Communication: No difficulties(wet voice)  Cognition Arousal/Alertness: Awake/alert Behavior During Therapy: WFL for tasks assessed/performed Overall Cognitive Status: Impaired/Different from baseline Area of Impairment: Problem solving                              Problem Solving: Slow processing General Comments: Pt able to answer all questions adequately, just took a little longer than normal      General Comments General comments (skin integrity, edema, etc.): Pt maintaining SpO2 >92% with mobility on 10L HFNC, decreased to 9L HFNC at end of session (RN aware)    Exercises     Assessment/Plan    PT Assessment Patient needs continued PT services  PT Problem List Decreased strength;Decreased activity tolerance;Decreased balance;Decreased mobility;Decreased cognition;Decreased knowledge of use of DME;Cardiopulmonary status limiting activity       PT Treatment Interventions DME instruction;Gait training;Stair training;Functional mobility training;Therapeutic activities;Therapeutic exercise;Balance training;Patient/family education;Cognitive remediation    PT Goals (Current goals can be found in the Care Plan section)  Acute Rehab PT Goals Patient Stated Goal: Get a new job PT Goal Formulation: With patient Time For Goal Achievement: 01/30/19 Potential to Achieve Goals: Good    Frequency Min 3X/week   Barriers to discharge        Co-evaluation PT/OT/SLP Co-Evaluation/Treatment: Yes Reason for Co-Treatment: For patient/therapist safety;To address functional/ADL transfers PT goals addressed during session: Mobility/safety with mobility;Balance OT goals addressed during session: ADL's and self-care       AM-PAC PT "6 Clicks" Mobility  Outcome Measure Help needed turning from your back to your side while in a flat bed without using bedrails?: A Little Help needed moving from lying on your back to sitting on the side of a flat bed without using bedrails?: A Little Help needed moving to and from a bed to a chair (including a wheelchair)?: A Little Help needed standing up from a chair using your arms (e.g., wheelchair or bedside chair)?: A Little Help needed to walk in hospital room?: A Little Help needed climbing 3-5 steps  with a railing? : A Lot 6 Click Score: 17    End of Session Equipment Utilized During Treatment: Oxygen Activity Tolerance: Patient tolerated treatment well;Patient limited by fatigue Patient left: in chair;with call bell/phone within reach;with nursing/sitter in room Nurse Communication: Mobility status PT Visit Diagnosis: Other abnormalities of gait and mobility (R26.89)    Time: 1245-8099 PT Time Calculation (min) (ACUTE ONLY): 25 min   Charges:   PT Evaluation $PT Eval Moderate Complexity: 1 Mod     Ina Homes, PT, DPT Acute Rehabilitation Services  Pager [phone] Office [phone]  Malachy Chamber 01/16/2019, 12:24 PM

## 2019-01-16 NOTE — Evaluation (Signed)
Clinical/Bedside Swallow Evaluation Patient Details  Name: Dinero Chavira MRN: [id number] Date of Birth: 1955/05/23  Today's Date: 01/16/2019 Time: SLP Start Time (ACUTE ONLY): 1011 SLP Stop Time (ACUTE ONLY): 1028 SLP Time Calculation (min) (ACUTE ONLY): 17 min  Past Medical History:  Past Medical History:  Diagnosis Date  . Diabetes mellitus without complication ([place])   . Hypertension    Past Surgical History: History reviewed. No pertinent surgical history. HPI:  Austin Shannon  is a 63 y.o. 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   Assessment / Plan / Recommendation Clinical Impression  Pt sitting in chair without respiratory distress. RN reported increased mucous that he is able to expectorate. Respiratory and swallow cadence functional without s/s aspiration or changes from respiratory standpoint. He appeared to tolerate sequential straw sips water. Recommend regular texture, thin liquids and no follow up at this time.      SLP Visit Diagnosis: Dysphagia, unspecified (R13.10)    Aspiration Risk  Mild aspiration risk    Diet Recommendation Regular;Thin liquid   Liquid Administration via: Cup;Straw Medication Administration: Whole meds with liquid Supervision: Patient able to self feed Compensations: Slow rate;Small sips/bites Postural Changes: Seated upright at 90 degrees    Other  Recommendations Oral Care Recommendations: Oral care BID   Follow up Recommendations None      Frequency and Duration            Prognosis        Swallow Study   General HPI: Austin Shannon  is a 63 y.o. 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 Type of Study: Bedside Swallow Evaluation Previous Swallow Assessment: (NONE) Diet Prior to this Study: NPO Temperature Spikes Noted: No Respiratory Status: Other (comment)(hfnc) History of Recent Intubation: No Behavior/Cognition: Alert;Cooperative;Pleasant mood Oral Cavity Assessment: Within Functional Limits Oral Care Completed by SLP: No Oral Cavity - Dentition: Adequate natural dentition Vision: Functional for self-feeding Self-Feeding Abilities: Able to feed self Patient Positioning: Upright in chair Baseline Vocal Quality: Normal Volitional Cough: Strong Volitional Swallow: Able to elicit    Oral/Motor/Sensory Function Overall Oral Motor/Sensory Function: Within functional limits   Ice Chips Ice chips: Not tested   Thin Liquid Thin Liquid: Within functional limits    Nectar Thick Nectar Thick Liquid: Not tested   Honey Thick Honey Thick Liquid: Not tested   Puree Puree: Within functional limits   Solid     Solid: Within functional limits      Houston Siren 01/16/2019,11:04 AM  Orbie Pyo Colvin Caroli.Ed Risk analyst [phone] Office [phone]

## 2019-01-17 ENCOUNTER — Inpatient Hospital Stay (HOSPITAL_COMMUNITY): Payer: HRSA Program

## 2019-01-17 DIAGNOSIS — G92 Toxic encephalopathy: Secondary | ICD-10-CM

## 2019-01-17 DIAGNOSIS — N170 Acute kidney failure with tubular necrosis: Secondary | ICD-10-CM

## 2019-01-17 DIAGNOSIS — A419 Sepsis, unspecified organism: Secondary | ICD-10-CM

## 2019-01-17 DIAGNOSIS — R6521 Severe sepsis with septic shock: Secondary | ICD-10-CM

## 2019-01-17 LAB — COMPREHENSIVE METABOLIC PANEL
ALT: 40 U/L (ref 0–44)
AST: 65 U/L — ABNORMAL HIGH (ref 15–41)
Albumin: 2.8 g/dL — ABNORMAL LOW (ref 3.5–5.0)
Alkaline Phosphatase: 49 U/L (ref 38–126)
Anion gap: 13 (ref 5–15)
BUN: 35 mg/dL — ABNORMAL HIGH (ref 8–23)
CO2: 28 mmol/L (ref 22–32)
Calcium: 8 mg/dL — ABNORMAL LOW (ref 8.9–10.3)
Chloride: 101 mmol/L (ref 98–111)
Creatinine, Ser: 1.43 mg/dL — ABNORMAL HIGH (ref 0.61–1.24)
GFR calc Af Amer: 60 mL/min — ABNORMAL LOW (ref >60)
GFR calc non Af Amer: 52 mL/min — ABNORMAL LOW (ref >60)
Glucose, Bld: 213 mg/dL — ABNORMAL HIGH (ref 70–99)
Potassium: 4.2 mmol/L (ref 3.5–5.1)
Sodium: 142 mmol/L (ref 135–145)
Total Bilirubin: 0.3 mg/dL (ref 0.3–1.2)
Total Protein: 6.3 g/dL — ABNORMAL LOW (ref 6.5–8.1)

## 2019-01-17 LAB — CBC WITH DIFFERENTIAL/PLATELET
Abs Immature Granulocytes: 0.02 10*3/uL (ref 0.00–0.07)
Basophils Absolute: 0 10*3/uL (ref 0.0–0.1)
Basophils Relative: 1 %
Eosinophils Absolute: 0 10*3/uL (ref 0.0–0.5)
Eosinophils Relative: 0 %
HCT: 39.1 % (ref 39.0–52.0)
Hemoglobin: 12 g/dL — ABNORMAL LOW (ref 13.0–17.0)
Immature Granulocytes: 0 %
Lymphocytes Relative: 8 %
Lymphs Abs: 0.6 10*3/uL — ABNORMAL LOW (ref 0.7–4.0)
MCH: 24.4 pg — ABNORMAL LOW (ref 26.0–34.0)
MCHC: 30.7 g/dL (ref 30.0–36.0)
MCV: 79.6 fL — ABNORMAL LOW (ref 80.0–100.0)
Monocytes Absolute: 0.4 10*3/uL (ref 0.1–1.0)
Monocytes Relative: 5 %
Neutro Abs: 7 10*3/uL (ref 1.7–7.7)
Neutrophils Relative %: 86 %
Platelets: 198 10*3/uL (ref 150–400)
RBC: 4.91 MIL/uL (ref 4.22–5.81)
RDW: 15.2 % (ref 11.5–15.5)
WBC: 8.1 10*3/uL (ref 4.0–10.5)
nRBC: 0 % (ref 0.0–0.2)

## 2019-01-17 LAB — BRAIN NATRIURETIC PEPTIDE: B Natriuretic Peptide: 82 pg/mL (ref 0.0–100.0)

## 2019-01-17 LAB — MAGNESIUM: Magnesium: 2.6 mg/dL — ABNORMAL HIGH (ref 1.7–2.4)

## 2019-01-17 LAB — GLUCOSE, CAPILLARY
Glucose-Capillary: 216 mg/dL — ABNORMAL HIGH (ref 70–99)
Glucose-Capillary: 210 mg/dL — ABNORMAL HIGH (ref 70–99)
Glucose-Capillary: 167 mg/dL — ABNORMAL HIGH (ref 70–99)
Glucose-Capillary: 347 mg/dL — ABNORMAL HIGH (ref 70–99)
Glucose-Capillary: 376 mg/dL — ABNORMAL HIGH (ref 70–99)
Glucose-Capillary: 160 mg/dL — ABNORMAL HIGH (ref 70–99)

## 2019-01-17 LAB — CK: Total CK: 3782 U/L — ABNORMAL HIGH (ref 49–397)

## 2019-01-17 LAB — PROCALCITONIN: Procalcitonin: 53.75 ng/mL

## 2019-01-17 LAB — D-DIMER, QUANTITATIVE: D-Dimer, Quant: 3.33 ug/mL-FEU — ABNORMAL HIGH (ref 0.00–0.50)

## 2019-01-17 LAB — C-REACTIVE PROTEIN: CRP: 21.3 mg/dL — ABNORMAL HIGH (ref <1.0)

## 2019-01-17 MED ORDER — DICLOFENAC SODIUM 1 % EX GEL
2.0000 g | Freq: Four times a day (QID) | CUTANEOUS | Status: DC
  Administered 2019-01-17 – 2019-01-28 (×36): 2 g via TOPICAL
  Filled 2019-01-17: qty 100

## 2019-01-17 MED ORDER — TRAMADOL HCL 50 MG PO TABS
50.0000 mg | ORAL_TABLET | Freq: Four times a day (QID) | ORAL | Status: DC | PRN
  Administered 2019-01-17 – 2019-01-27 (×9): 50 mg via ORAL
  Filled 2019-01-17 (×9): qty 1

## 2019-01-17 NOTE — Progress Notes (Signed)
PROGRESS NOTE                                                                                                                                                                                                             Patient Demographics:    Austin Shannon, is a 63 y.o. male, DOB - 1955/04/04, IWL:[id number]  Outpatient Primary MD for the patient is Patient, No Pcp Per    LOS - 2  Admit date - 01/15/2019    CC - AMS     Brief Narrative -  Austin Shannon  is a 63 y.o. 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   Subjective:   Patient in bed, appears comfortable, denies any headache, no fever, no chest pain or pressure, no shortness of breath , no abdominal pain. No focal weakness.  He is having some right-sided lumbosacral discomfort.    Assessment  & Plan :     1.  Sepsis, septic shock, COVID-19 pneumonia, with concurrent bacterial infection.    He had extremely high procalcitonin in the range of 100, elevated lactic acid, elevated creatinine and high CRP.  He also has history of treated TB in the past.  He was started on IV remdesivir, IV steroids, IV empiric antibiotics.  Clinically he has improved, noncontrast CT neck, chest, abdomen and pelvis unremarkable.  There is question if he is aspirated as he has some gurgling on exam for which speech has been consulted.  Currently n.p.o.  Continue supportive care.  Still quite sick and tenuous but  better than he was at the time of admission.    Continue to monitor blood cultures.   SpO2: 93 % O2 Flow Rate (L/min): 7 L/min  Recent Labs  Lab 01/15/19 0257 01/15/19 1136 01/16/19 0445 01/17/19 0345  CRP 24.2* 35.6* 43.8* 21.3*  DDIMER  --  4.41* 5.04* 3.33*  FERRITIN 402*  --   --   --   BNP 161.0* 83.4 61.0 82.0  PROCALCITON 74.80 111.36 96.21 53.75    Hepatic Function Latest Ref Rng & Units 01/17/2019 01/16/2019 01/15/2019  Total Protein 6.5 - 8.1 g/dL 6.3(L) 7.2 8.0  Albumin 3.5 - 5.0 g/dL 2.8(L) 3.3(L) 3.9  AST 15 - 41 U/L 65(H) 104(H) 111(H)  ALT 0 - 44 U/L 40 48(H) 55(H)  Alk Phosphatase 38 - 126 U/L 49 44 50  Total Bilirubin 0.3 - 1.2 mg/dL 0.3 0.6 0.5    2.  Dehydration, rhabdomyolysis with AKI and hyperkalemia.  Continue Foley for now, CT scan nonacute, improving with hydration and Kayexalate, trend CK which is improving.  3.  Transaminitis.  Due to COVID-19 along with rhabdomyolysis, symptom-free no abdominal pain.  CT abdomen pelvis nonacute.  Symptom-free will monitor.  4.  Dyslipidemia.  Home dose statin once able to swallow.  5.  DM type II.  Check A1c, placed on Lantus and sliding scale every 4.  5.  Toxic encephalopathy.  Improving with supportive care no focal deficits.  6.  Borderline elevated troponin.  Not an ACS pattern, chest pain-free, EKG nonacute, aspirin, beta-blocker, stable echo.  7.  Hypertension.  Stable on Coreg and Norvasc.  8.  Dysphagia and odynophagia.  Soft tissue neck CT unremarkable, could not add contrast due to renal failure, supportive care with antibiotics, will check strep throat.  Speech following and currently on regular diet per speech.  9.  Right-sided lumbosacral discomfort.  Most likely musculoskeletal due to his recent fall at home, CT L-spine and pelvis unremarkable and nonacute.  Supportive care with NSAID cream.    Condition - Extremely Guarded  Family Communication  :  Wife 12/9, 12/10   Code Status :   DNR  Diet :   Diet Order            Diet Carb Modified Fluid consistency: Thin; Room service appropriate? Yes  Diet effective now               Disposition Plan  :   PCU  Consults  :     Procedures  :    CT soft tissue neck, chest abdomen pelvis.  All noncontrast due to AKI.  Nonacute except for bilateral pneumonia.  CT L-spine and pelvis.  Nonacute.  Echocardiogram    1. Left ventricular ejection fraction, by visual estimation, is 60 to 65%. The left ventricle has normal function. There is mildly increased left ventricular hypertrophy.  2. Left ventricular diastolic parameters are consistent with Grade I diastolic dysfunction (impaired relaxation).  3. The left ventricle has no regional wall motion abnormalities.  4. Global right ventricle has normal systolic function.The right ventricular size is normal.  5. Left atrial size was normal.  6. Right atrial size was normal.  7. Mild mitral annular calcification.  8. The mitral valve is normal in structure. No evidence of mitral valve regurgitation. No evidence of mitral stenosis.  9. The tricuspid valve is normal in structure. Tricuspid valve regurgitation is mild. 10. The aortic valve has an indeterminant number of cusps. Aortic valve regurgitation is not visualized. Mild to moderate aortic valve sclerosis/calcification without any evidence of aortic stenosis. 11. The pulmonic valve was not well visualized. Pulmonic valve regurgitation is not visualized. 12. Technically difficult; normal LV systolic function; grade 1 diastolic dysfunction; mild LVH with mild proximal septal thickening; calcified aortic valve (not well interrogated but no clear AS  by doppler).   PUD Prophylaxis : PPI  DVT Prophylaxis  :    Switch to high-dose prophylactic Lovenox on 01/16/2019  Lab Results  Component Value Date   PLT 198 01/17/2019    Inpatient Medications  Scheduled Meds: . amLODipine  10 mg Oral Daily  . aspirin  81 mg Oral Daily    . carvedilol  6.25 mg Oral BID  . Chlorhexidine Gluconate Cloth  6 each Topical Daily  . diclofenac Sodium  2 g Topical QID  . docusate sodium  100 mg Oral BID  . enoxaparin (LOVENOX) injection  0.5 mg/kg Subcutaneous Q12H  . insulin aspart  0-20 Units Subcutaneous Q4H  . insulin aspart  3 Units Subcutaneous TID WC  . insulin glargine  15 Units Subcutaneous Once  . insulin glargine  35 Units Subcutaneous Daily  . methylPREDNISolone (SOLU-MEDROL) injection  60 mg Intravenous Q12H  . pantoprazole (PROTONIX) IV  40 mg Intravenous Q12H  . sodium polystyrene  30 g Oral Once   Continuous Infusions: . ampicillin-sulbactam (UNASYN) IV 3 g (01/17/19 0552)  . lactated ringers 50 mL/hr (01/17/19 0936)  . remdesivir 100 mg in NS 100 mL 100 mg (01/17/19 0935)   PRN Meds:.acetaminophen, acetaminophen, menthol-cetylpyridinium, metoprolol tartrate, [DISCONTINUED] ondansetron **OR** ondansetron (ZOFRAN) IV, traMADol  Antibiotics  :    Anti-infectives (From admission, onward)   Start     Dose/Rate Route Frequency Ordered Stop   01/16/19 1000  remdesivir 100 mg in sodium chloride 0.9 % 100 mL IVPB  Status:  Discontinued     100 mg 200 mL/hr over 30 Minutes Intravenous Daily 01/15/19 1104 01/15/19 1107   01/16/19 1000  remdesivir 100 mg in sodium chloride 0.9 % 100 mL IVPB     100 mg 200 mL/hr over 30 Minutes Intravenous Daily 01/15/19 1114 01/20/19 0959   01/15/19 1200  remdesivir 200 mg in sodium chloride 0.9% 250 mL IVPB  Status:  Discontinued     200 mg 580 mL/hr over 30 Minutes Intravenous Once 01/15/19 1104 01/15/19 1107   01/15/19 1200  Ampicillin-Sulbactam (UNASYN) 3 g in sodium chloride 0.9 % 100 mL IVPB     3 g 200 mL/hr over 30 Minutes Intravenous Every 6 hours 01/15/19 1107         Time Spent in minutes  30   Lala Lund M.D on 01/17/2019 at 11:18 AM  To page go to [email or web address] - password [facility]  [facility] -  Office  [phone]  See all Orders from today for  further details    Objective:   Vitals:   01/17/19 0745 01/17/19 0750 01/17/19 0755 01/17/19 0800  BP:    136/82  Pulse: 100 95 87 97  Resp: (!) 26 (!) 31 (!) 22 20  Temp:    98.4 F (36.9 C)  TempSrc:    Oral  SpO2: 93% 93% 92% 93%  Weight:      Height:        Wt Readings from Last 3 Encounters:  01/15/19 128.4 kg  01/15/19 128.4 kg     Intake/Output Summary (Last 24 hours) at 01/17/2019 1118 Last data filed at 01/17/2019 0740 Gross per 24 hour  Intake 2154.92 ml  Output 5803 ml  Net -3648.08 ml     Physical Exam  Awake Alert, Oriented X 3, No new F.N deficits,   [place].AT,PERRAL Supple Neck,No JVD, No cervical lymphadenopathy appriciated.  Symmetrical Chest wall movement, Good air movement bilaterally, Coarse b sounds RRR,No Gallops, Rubs or new Murmurs,  No Parasternal Heave +ve B.Sounds, Abd Soft, No tenderness, No organomegaly appriciated, No rebound - guarding or rigidity. No Cyanosis, Clubbing or edema, No new Rash or bruise        Data Review:    CBC Recent Labs  Lab 01/15/19 0257 01/15/19 1136 01/16/19 0445 01/17/19 0345  WBC 8.4 6.6 7.1 8.1  HGB 15.3 15.0 13.8 12.0*  HCT 49.8 49.6 45.4 39.1  PLT 236 207 192 198  MCV 78.7* 80.5 80.2 79.6*  MCH 24.2* 24.4* 24.4* 24.4*  MCHC 30.7 30.2 30.4 30.7  RDW 15.1 15.1 15.1 15.2  LYMPHSABS 1.4 1.1 0.9 0.6*  MONOABS 0.6 0.4 0.5 0.4  EOSABS 0.1 0.0 0.0 0.0  BASOSABS 0.1 0.1 0.1 0.0    Chemistries  Recent Labs  Lab 01/15/19 0257 01/15/19 1136 01/16/19 0445 01/17/19 0345  NA 132* 135 140 142  K 4.8 5.9* 4.1 4.2  CL 94* 97* 103 101  CO2 20* 26 28 28   GLUCOSE 343* 296* 202* 213*  BUN 26* 34* 36* 35*  CREATININE 1.99* 2.31* 1.86* 1.43*  CALCIUM 8.2* 8.0* 8.1* 8.0*  MG  --   --  2.2 2.6*  AST 69* 111* 104* 65*  ALT 50* 55* 48* 40  ALKPHOS 63 50 44 49  BILITOT 0.6 0.5 0.6 0.3    ------------------------------------------------------------------------------------------------------------------ Recent Labs    01/15/19 0257  TRIG 155*    Lab Results  Component Value Date   HGBA1C 8.2 (H) 01/15/2019   ------------------------------------------------------------------------------------------------------------------ No results for input(s): TSH, T4TOTAL, T3FREE, THYROIDAB in the last 72 hours.  Invalid input(s): FREET3  Cardiac Enzymes No results for input(s): CKMB, TROPONINI, MYOGLOBIN in the last 168 hours.  Invalid input(s): CK ------------------------------------------------------------------------------------------------------------------    Component Value Date/Time   BNP 82.0 01/17/2019 0345    Micro Results Recent Results (from the past 240 hour(s))  Culture, blood (routine x 2)     Status: None (Preliminary result)   Collection Time: 01/15/19  2:57 AM   Specimen: BLOOD  Result Value Ref Range Status   Specimen Description BLOOD RIGHT ANTECUBITAL  Final   Special Requests   Final    BOTTLES DRAWN AEROBIC AND ANAEROBIC Blood Culture results may not be optimal due to an excessive volume of blood received in culture bottles   Culture   Final    NO GROWTH 2 DAYS Performed at [facility], [address] [place], [place] [address]    Report Status PENDING  Incomplete  Culture, blood (routine x 2)     Status: None (Preliminary result)   Collection Time: 01/15/19  2:57 AM   Specimen: BLOOD  Result Value Ref Range Status   Specimen Description BLOOD BLOOD LEFT HAND  Final   Special Requests   Final    BOTTLES DRAWN AEROBIC AND ANAEROBIC Blood Culture results may not be optimal due to an inadequate volume of blood received in culture bottles   Culture   Final    NO GROWTH 2 DAYS Performed at [facility], [address] [place], [place] [address]    Report Status PENDING  Incomplete  Urine culture     Status: None    Collection Time: 01/15/19  9:41 AM   Specimen: Urine, Random  Result Value Ref Range Status   Specimen Description   Final    URINE, RANDOM Performed at [facility], [address], [place], [place] [address]    Special Requests   Final    NONE Performed at [facility], [address], [place], [place] [address]    Culture   Final    NO GROWTH Performed at [facility], [place] [address] [place], [place] [address]    Report Status 01/16/2019 FINAL  Final  Culture, Urine     Status: None   Collection Time: 01/15/19  6:45 PM   Specimen: Urine, Catheterized  Result Value Ref Range Status   Specimen Description   Final    URINE, CATHETERIZED Performed at [place] [address], [place], [place] [address]    Special Requests   Final    NONE Performed at [facility] [place] [address], [place], [place] [address]    Culture   Final    NO GROWTH Performed at [facility], [place] [address], [place], [place] [address]    Report Status 01/16/2019 FINAL  Final  Culture, respiratory     Status: None (Preliminary result)   Collection Time: 01/15/19  6:45 PM   Specimen: Tracheal Aspirate  Result Value Ref Range Status   Specimen Description   Final    TRACHEAL ASPIRATE Performed at [place] [address], [place], [place] [address]    Special Requests   Final    NONE Performed at [facility], [place] [address] [place], [place] [address]    Gram Stain   Final    RARE WBC PRESENT, PREDOMINANTLY PMN ABUNDANT GRAM POSITIVE COCCI IN PAIRS IN CLUSTERS ABUNDANT GRAM NEGATIVE RODS FEW GRAM POSITIVE RODS    Culture   Final    CULTURE REINCUBATED FOR BETTER GROWTH Performed at [facility], [place] [address] [place], [place] [address]    Report Status PENDING  Incomplete    Radiology Reports CT ABDOMEN PELVIS WO CONTRAST  Result Date: 01/15/2019 CLINICAL DATA:   Abdominal pain and fever. Abscess suspected. COVID-19 pneumonia. EXAM: CT CHEST, ABDOMEN AND PELVIS WITHOUT CONTRAST TECHNIQUE: Multidetector CT imaging of the chest, abdomen and pelvis was performed following the standard protocol without IV contrast. COMPARISON:  One-view chest x-ray 01/15/2019 FINDINGS: CT CHEST FINDINGS Cardiovascular: The heart size is normal. Scratched at the heart size upper limits of normal. Atherosclerotic calcifications are noted at the aortic valve and aortic arch. There is no aneurysm. Pulmonary artery size is normal. Mediastinum/Nodes: No significant mediastinal hilar, or axillary adenopathy is present. Esophagus is within normal limits. Thoracic inlet is normal. Lungs/Pleura: Bilateral lower lobe airspace consolidation is present. Additional patchy airspace disease is present in the superior segment of both lower lobes and in the right middle lobe. Mild patchy airspace opacities are present right upper lobe as well. Musculoskeletal: Vertebral body heights are maintained. No focal lytic or blastic lesions are present. CT ABDOMEN PELVIS FINDINGS Hepatobiliary: There is diffuse fatty infiltration of the liver. No discrete lesions are present. The common bile duct and gallbladder are within normal limits. Pancreas: Mild fatty infiltration of the pancreas is present. No discrete lesions are present. Spleen: Normal in size without focal abnormality. Adrenals/Urinary Tract: The adrenal glands are normal bilaterally. Kidneys are unremarkable. There is no stone or mass lesion. No obstruction is present. The ureters are within normal limits. The urinary bladder is decompressed with a Foley catheter in place. Stomach/Bowel: The stomach and duodenum are within limits. Small bowel is unremarkable. Terminal ileum is normal. Appendix is visualized and within limits. The ascending and transverse colon are normal. Descending and sigmoid colon are normal. Vascular/Lymphatic: Atherosclerotic changes are  present in the aorta and branch vessels. Reproductive: Prostate is unremarkable. Other: No abdominal wall hernia or abnormality. No abdominopelvic ascites. Focal fluid collection present to suggest abscess Musculoskeletal: Endplate changes are present at L5-S1. Vertebral body heights are maintained. There is some straightening of lumbar lordosis. Bony pelvis is within normal limits. Hips are located and within normal limits. IMPRESSION: 1. Bilateral lower lobe and right middle lobe airspace disease compatible with multifocal pneumonia. There is likely involvement right upper lobe as well. 2. Hepatic steatosis. 3. No other acute or focal abnormality to explain the patient's abdominal pain. 4. Degenerative changes in the lower lumbar spine. 5. Aortic Atherosclerosis (ICD10-I70.0). Electronically Signed   By: San Morelle M.D.   On: 01/15/2019 16:34   CT SOFT TISSUE NECK WO CONTRAST  Result Date: 01/15/2019 CLINICAL DATA:  COVID-19 positive.  Sepsis.  Renal insufficiency EXAM: CT NECK WITHOUT CONTRAST TECHNIQUE: Multidetector CT imaging of the neck was performed following the standard protocol without intravenous contrast. COMPARISON:  None. FINDINGS: Pharynx and larynx: Image quality degraded by motion. Normal airway.  No mass or abscess in the larynx. Salivary glands: No inflammation, mass, or stone. Thyroid: Negative Lymph nodes: No enlarged lymph nodes in the neck. Vascular: Limited vascular evaluation without intravenous contrast. Limited intracranial: Negative Visualized orbits: Negative Mastoids and visualized paranasal sinuses: Paranasal sinuses clear. Small osteoma left frontal sinus. Skeleton: Cervical spondylosis.  No acute skeletal abnormality. Upper chest: Negative Other: None IMPRESSION: No acute abnormality Exam limited by motion and lack of intravenous contrast. Electronically Signed   By: Franchot Gallo M.D.   On: 01/15/2019 16:34   CT CHEST WO CONTRAST  Result Date: 01/15/2019 CLINICAL  DATA:  Abdominal pain and fever. Abscess suspected. COVID-19 pneumonia. EXAM: CT CHEST, ABDOMEN AND PELVIS WITHOUT CONTRAST TECHNIQUE: Multidetector CT imaging of the chest, abdomen and pelvis was performed following the standard protocol without IV contrast. COMPARISON:  One-view chest x-ray 01/15/2019 FINDINGS: CT CHEST FINDINGS Cardiovascular: The heart size is normal. Scratched at the heart size upper limits of normal. Atherosclerotic calcifications are noted at the aortic valve and aortic arch. There is no aneurysm. Pulmonary artery size is normal. Mediastinum/Nodes: No significant mediastinal hilar, or axillary adenopathy is present. Esophagus is within normal limits. Thoracic inlet is normal. Lungs/Pleura: Bilateral lower lobe airspace consolidation is present. Additional patchy airspace disease is present in the superior segment of both lower lobes and in the right middle lobe. Mild patchy airspace opacities are present right upper lobe as well. Musculoskeletal: Vertebral body heights are maintained. No focal lytic or blastic lesions are present. CT ABDOMEN PELVIS FINDINGS Hepatobiliary: There is diffuse fatty infiltration of the liver. No discrete lesions are present. The common bile duct and gallbladder are within normal limits. Pancreas: Mild fatty infiltration of the pancreas is present. No discrete lesions are present. Spleen: Normal in size without focal abnormality. Adrenals/Urinary Tract: The adrenal glands are normal bilaterally. Kidneys are unremarkable. There is no stone or mass lesion. No obstruction is present. The ureters are within normal limits. The urinary bladder is decompressed with a Foley catheter in place. Stomach/Bowel: The stomach and duodenum are within limits. Small bowel is unremarkable. Terminal ileum is normal. Appendix is visualized and within limits. The ascending and transverse colon are normal. Descending and sigmoid colon are normal. Vascular/Lymphatic: Atherosclerotic  changes are present in the aorta and branch vessels. Reproductive: Prostate is unremarkable. Other: No abdominal wall hernia or abnormality. No abdominopelvic ascites. Focal fluid collection present to suggest abscess Musculoskeletal: Endplate changes  are present at L5-S1. Vertebral body heights are maintained. There is some straightening of lumbar lordosis. Bony pelvis is within normal limits. Hips are located and within normal limits. IMPRESSION: 1. Bilateral lower lobe and right middle lobe airspace disease compatible with multifocal pneumonia. There is likely involvement right upper lobe as well. 2. Hepatic steatosis. 3. No other acute or focal abnormality to explain the patient's abdominal pain. 4. Degenerative changes in the lower lumbar spine. 5. Aortic Atherosclerosis (ICD10-I70.0). Electronically Signed   By: San Morelle M.D.   On: 01/15/2019 16:34   CT LUMBAR SPINE WO CONTRAST  Result Date: 01/17/2019 CLINICAL DATA:  Right lumbosacral pain radiating to the leg. Low back pain with increased fracture risk EXAM: CT LUMBAR SPINE WITHOUT CONTRAST TECHNIQUE: Multidetector CT imaging of the lumbar spine was performed without intravenous contrast administration. Multiplanar CT image reconstructions were also generated. COMPARISON:  None. FINDINGS: Segmentation: 5 lumbar type vertebrae Alignment: Normal Vertebrae: No evidence of fracture, discitis, or aggressive bone lesion Paraspinal and other soft tissues: Bilateral pneumonia in this patient with known COVID-19. Disc levels: Disc narrowing and endplate degeneration at L4-5. Mild facet spurring at L4-5 and below IMPRESSION: 1. No acute finding in the lumbar spine. 2. L4-5 moderate disc degeneration. Electronically Signed   By: Monte Fantasia M.D.   On: 01/17/2019 09:40   CT PELVIS WO CONTRAST  Result Date: 01/17/2019 CLINICAL DATA:  Pelvic fracture. EXAM: CT PELVIS WITHOUT CONTRAST TECHNIQUE: Multidetector CT imaging of the pelvis was performed  following the standard protocol without intravenous contrast. COMPARISON:  January 15, 2019. FINDINGS: Urinary Tract: Urinary bladder is decompressed secondary to Foley catheter. Bowel:  Unremarkable visualized pelvic bowel loops. Vascular/Lymphatic: Atherosclerosis of visualized abdominal aorta and iliac arteries is noted. No adenopathy is noted. Reproductive:  No mass or other significant abnormality Other:  None. Musculoskeletal: No suspicious bone lesions identified. IMPRESSION: No definite evidence of pelvic fracture or other significant bony abnormality. Aortic Atherosclerosis (ICD10-I70.0). Electronically Signed   By: Marijo Conception M.D.   On: 01/17/2019 09:45   CXR am  Result Date: 01/15/2019 CLINICAL DATA:  Acute shortness of breath. Follow-up COVID-19 pneumonia. EXAM: PORTABLE CHEST 1 VIEW 11:16 a.m.: COMPARISON:  Portable chest x-ray earlier same day at 2:55 a.m. FINDINGS: Cardiac silhouette enlarged for AP portable technique, unchanged. Progressive airspace consolidation involving the lower lobes and the RIGHT MIDDLE LOBE since the examination earlier this morning. Streaky opacities in the lingula. UPPER lobes remain clear otherwise. Normal pulmonary vascularity. IMPRESSION: 1. Progressive bilateral lower lobe pneumonia since the examination earlier this morning. 2. Streaky opacities in the lingula, likely atelectasis. 3. Stable cardiomegaly without pulmonary edema. Electronically Signed   By: Evangeline Dakin M.D.   On: 01/15/2019 11:26   DG Chest Port 1 View  Result Date: 01/15/2019 CLINICAL DATA:  Shortness of breath. EXAM: PORTABLE CHEST 1 VIEW COMPARISON:  None. FINDINGS: The heart is enlarged. Patchy right greater than left airspace opacities in the lung bases. No pulmonary edema. No definite pleural effusion. No pneumothorax. No acute osseous abnormalities are seen. IMPRESSION: 1. Right greater than left basilar airspace opacities, suspicious for pneumonia. 2. Mild cardiomegaly.  Electronically Signed   By: Keith Rake M.D.   On: 01/15/2019 03:19   ECHOCARDIOGRAM COMPLETE  Result Date: 01/16/2019   ECHOCARDIOGRAM REPORT   Patient Name:   ROMY MCGUE Date of Exam: 01/16/2019 Medical Rec #:  [id number]    Height: Accession #:    [id number]   Weight:  283.1 lb Date of Birth:  11-30-1955   BSA:          2.55 m Patient Age:    4 years     BP:           155/98 mmHg Patient Gender: M            HR:           105 bpm. Exam Location:  Inpatient Procedure: 2D Echo Indications:    Chest Pain 786.50 / R07.9  History:        Patient has no prior history of Echocardiogram examinations.                 Risk Factors:Hypertension and Diabetes. COVID 19.  Sonographer:    Darlina Sicilian RDCS Referring Phys: Graylin Shiver [facility]  Sonographer Comments: Echo performed with the patient sitting upright in his chair IMPRESSIONS  1. Left ventricular ejection fraction, by visual estimation, is 60 to 65%. The left ventricle has normal function. There is mildly increased left ventricular hypertrophy.  2. Left ventricular diastolic parameters are consistent with Grade I diastolic dysfunction (impaired relaxation).  3. The left ventricle has no regional wall motion abnormalities.  4. Global right ventricle has normal systolic function.The right ventricular size is normal.  5. Left atrial size was normal.  6. Right atrial size was normal.  7. Mild mitral annular calcification.  8. The mitral valve is normal in structure. No evidence of mitral valve regurgitation. No evidence of mitral stenosis.  9. The tricuspid valve is normal in structure. Tricuspid valve regurgitation is mild. 10. The aortic valve has an indeterminant number of cusps. Aortic valve regurgitation is not visualized. Mild to moderate aortic valve sclerosis/calcification without any evidence of aortic stenosis. 11. The pulmonic valve was not well visualized. Pulmonic valve regurgitation is not visualized. 12. Technically difficult; normal  LV systolic function; grade 1 diastolic dysfunction; mild LVH with mild proximal septal thickening; calcified aortic valve (not well interrogated but no clear AS by doppler). FINDINGS  Left Ventricle: Left ventricular ejection fraction, by visual estimation, is 60 to 65%. The left ventricle has normal function. The left ventricle has no regional wall motion abnormalities. There is mildly increased left ventricular hypertrophy. Left ventricular diastolic parameters are consistent with Grade I diastolic dysfunction (impaired relaxation). Normal left atrial pressure. Right Ventricle: The right ventricular size is normal.Global RV systolic function is has normal systolic function. Left Atrium: Left atrial size was normal in size. Right Atrium: Right atrial size was normal in size Pericardium: There is no evidence of pericardial effusion. Mitral Valve: The mitral valve is normal in structure. Mild mitral annular calcification. No evidence of mitral valve regurgitation. No evidence of mitral valve stenosis by observation. Tricuspid Valve: The tricuspid valve is normal in structure. Tricuspid valve regurgitation is mild. Aortic Valve: The aortic valve has an indeterminant number of cusps. Aortic valve regurgitation is not visualized. Mild to moderate aortic valve sclerosis/calcification is present, without any evidence of aortic stenosis. Pulmonic Valve: The pulmonic valve was not well visualized. Pulmonic valve regurgitation is not visualized. Pulmonic regurgitation is not visualized. Aorta: The aortic root is normal in size and structure. Venous: The inferior vena cava was not well visualized.  Additional Comments: Technically difficult; normal LV systolic function; grade 1 diastolic dysfunction; mild LVH with mild proximal septal thickening; calcified aortic valve (not well interrogated but no clear AS by doppler).  LEFT VENTRICLE PLAX 2D LVIDd:  3.78 cm  Diastology LVIDs:         3.04 cm  LV e' lateral:   10.60  cm/s LV PW:         0.90 cm  LV E/e' lateral: 9.0 LV IVS:        1.60 cm  LV e' medial:    6.96 cm/s LVOT diam:     1.90 cm  LV E/e' medial:  13.7 LV SV:         25 ml LVOT Area:     2.84 cm  LEFT ATRIUM             Index LA diam:        3.50 cm 1.37 cm/m LA Vol (A2C):   64.9 ml 25.50 ml/m LA Vol (A4C):   37.7 ml 14.81 ml/m LA Biplane Vol: 54.7 ml 21.49 ml/m  AORTIC VALVE LVOT Vmax:   99.20 cm/s LVOT Vmean:  59.300 cm/s LVOT VTI:    0.156 m  AORTA Ao Root diam: 3.10 cm MITRAL VALVE                         TRICUSPID VALVE MV Area (PHT): 7.09 cm              TR Peak grad:   35.0 mmHg MV PHT:        31.03 msec            TR Vmax:        296.00 cm/s MV Decel Time: 107 msec MV E velocity: 95.50 cm/s  103 cm/s  SHUNTS MV A velocity: 120.00 cm/s 70.3 cm/s Systemic VTI:  0.16 m MV E/A ratio:  0.80        1.5       Systemic Diam: 1.90 cm  Kirk Ruths MD Electronically signed by Kirk Ruths MD Signature Date/Time: 01/16/2019/1:35:47 PM    Final

## 2019-01-17 NOTE — Plan of Care (Signed)
  Problem: Education: Goal: Knowledge of risk factors and measures for prevention of condition will improve Outcome: Progressing   

## 2019-01-18 LAB — COMPREHENSIVE METABOLIC PANEL
ALT: 41 U/L (ref 0–44)
AST: 48 U/L — ABNORMAL HIGH (ref 15–41)
Albumin: 2.9 g/dL — ABNORMAL LOW (ref 3.5–5.0)
Alkaline Phosphatase: 55 U/L (ref 38–126)
Anion gap: 12 (ref 5–15)
BUN: 30 mg/dL — ABNORMAL HIGH (ref 8–23)
CO2: 29 mmol/L (ref 22–32)
Calcium: 7.7 mg/dL — ABNORMAL LOW (ref 8.9–10.3)
Chloride: 100 mmol/L (ref 98–111)
Creatinine, Ser: 1.32 mg/dL — ABNORMAL HIGH (ref 0.61–1.24)
GFR calc Af Amer: >60 mL/min (ref >60)
GFR calc non Af Amer: 57 mL/min — ABNORMAL LOW (ref >60)
Glucose, Bld: 113 mg/dL — ABNORMAL HIGH (ref 70–99)
Potassium: 4.3 mmol/L (ref 3.5–5.1)
Sodium: 141 mmol/L (ref 135–145)
Total Bilirubin: 0.6 mg/dL (ref 0.3–1.2)
Total Protein: 6.5 g/dL (ref 6.5–8.1)

## 2019-01-18 LAB — CBC WITH DIFFERENTIAL/PLATELET
Abs Immature Granulocytes: 0.07 10*3/uL (ref 0.00–0.07)
Basophils Absolute: 0 10*3/uL (ref 0.0–0.1)
Basophils Relative: 0 %
Eosinophils Absolute: 0 10*3/uL (ref 0.0–0.5)
Eosinophils Relative: 0 %
HCT: 39.8 % (ref 39.0–52.0)
Hemoglobin: 11.9 g/dL — ABNORMAL LOW (ref 13.0–17.0)
Immature Granulocytes: 1 %
Lymphocytes Relative: 6 %
Lymphs Abs: 0.8 10*3/uL (ref 0.7–4.0)
MCH: 23.9 pg — ABNORMAL LOW (ref 26.0–34.0)
MCHC: 29.9 g/dL — ABNORMAL LOW (ref 30.0–36.0)
MCV: 80.1 fL (ref 80.0–100.0)
Monocytes Absolute: 0.8 10*3/uL (ref 0.1–1.0)
Monocytes Relative: 6 %
Neutro Abs: 11.4 10*3/uL — ABNORMAL HIGH (ref 1.7–7.7)
Neutrophils Relative %: 87 %
Platelets: 235 10*3/uL (ref 150–400)
RBC: 4.97 MIL/uL (ref 4.22–5.81)
RDW: 15.4 % (ref 11.5–15.5)
WBC: 13.1 10*3/uL — ABNORMAL HIGH (ref 4.0–10.5)
nRBC: 0 % (ref 0.0–0.2)

## 2019-01-18 LAB — GLUCOSE, CAPILLARY
Glucose-Capillary: 127 mg/dL — ABNORMAL HIGH (ref 70–99)
Glucose-Capillary: 159 mg/dL — ABNORMAL HIGH (ref 70–99)
Glucose-Capillary: 174 mg/dL — ABNORMAL HIGH (ref 70–99)
Glucose-Capillary: 219 mg/dL — ABNORMAL HIGH (ref 70–99)
Glucose-Capillary: 307 mg/dL — ABNORMAL HIGH (ref 70–99)
Glucose-Capillary: 73 mg/dL (ref 70–99)

## 2019-01-18 LAB — MAGNESIUM: Magnesium: 2.8 mg/dL — ABNORMAL HIGH (ref 1.7–2.4)

## 2019-01-18 LAB — D-DIMER, QUANTITATIVE: D-Dimer, Quant: 1.93 ug/mL-FEU — ABNORMAL HIGH (ref 0.00–0.50)

## 2019-01-18 LAB — PROCALCITONIN: Procalcitonin: 27.77 ng/mL

## 2019-01-18 LAB — BRAIN NATRIURETIC PEPTIDE: B Natriuretic Peptide: 134.1 pg/mL — ABNORMAL HIGH (ref 0.0–100.0)

## 2019-01-18 LAB — C-REACTIVE PROTEIN: CRP: 11.9 mg/dL — ABNORMAL HIGH (ref <1.0)

## 2019-01-18 MED ORDER — ENOXAPARIN SODIUM 80 MG/0.8ML ~~LOC~~ SOLN
65.0000 mg | SUBCUTANEOUS | Status: DC
  Administered 2019-01-19 – 2019-01-28 (×10): 65 mg via SUBCUTANEOUS
  Filled 2019-01-18 (×10): qty 0.8

## 2019-01-18 MED ORDER — METHYLPREDNISOLONE SODIUM SUCC 125 MG IJ SOLR
60.0000 mg | Freq: Every day | INTRAMUSCULAR | Status: DC
  Administered 2019-01-19: 10:00:00 60 mg via INTRAVENOUS
  Filled 2019-01-18: qty 2

## 2019-01-18 MED ORDER — FUROSEMIDE 10 MG/ML IJ SOLN
20.0000 mg | Freq: Once | INTRAMUSCULAR | Status: AC
  Administered 2019-01-18: 13:00:00 20 mg via INTRAVENOUS
  Filled 2019-01-18: qty 2

## 2019-01-18 NOTE — Progress Notes (Signed)
Updated wife over the phone

## 2019-01-18 NOTE — Progress Notes (Addendum)
                                  PROGRESS NOTE                                                                                                                                                                                                             Patient Demographics:    Austin Shannon, is a 63 y.o. male, DOB - 11/10/1955, MRN:[id number]  Outpatient Primary MD for the patient is Patient, No Pcp Per    LOS - 3  Admit date - 01/15/2019    CC - AMS     Brief Narrative -  Austin Shannon  is a 63 y.o. 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   Subjective:   Patient in bed, appears comfortable, denies any headache, no fever, no chest pain or pressure, improved shortness of breath , no abdominal pain. No focal weakness.   Assessment  & Plan :     1.  Sepsis, septic shock, COVID-19 pneumonia, with concurrent bacterial infection.    He had extremely high procalcitonin in the range of 100, elevated lactic acid, elevated creatinine and high CRP.  He also has history of treated TB in the past.  He was started on IV remdesivir, IV steroids, IV empiric antibiotics.  Clinically he has improved, noncontrast CT neck, chest, abdomen and pelvis unremarkable.  There is question if he is aspirated as he has some gurgling on exam for which speech has been consulted.  Currently n.p.o.  Continue supportive care.  Still quite sick and tenuous but better than he was at the time of admission.     Continue to monitor blood cultures.   SpO2: 95 % O2 Flow Rate (L/min): 6 L/min  Recent Labs  Lab 01/15/19 0257 01/15/19 1136 01/16/19 0445 01/17/19 0345 01/18/19 0357  CRP 24.2* 35.6* 43.8* 21.3* 11.9*  DDIMER  --  4.41* 5.04* 3.33* 1.93*  FERRITIN   402*  --   --   --   --   BNP 161.0* 83.4 61.0 82.0 134.1*  PROCALCITON 74.80 111.36 96.21 53.75 27.77    Hepatic Function Latest Ref Rng & Units 01/18/2019 01/17/2019 01/16/2019  Total Protein 6.5 - 8.1 g/dL 6.5 6.3(L) 7.2  Albumin 3.5 - 5.0 g/dL 2.9(L) 2.8(L) 3.3(L)  AST 15 - 41 U/L 48(H) 65(H) 104(H)  ALT 0 - 44 U/L 41 40 48(H)  Alk Phosphatase 38 - 126 U/L 55 49 44  Total Bilirubin 0.3 - 1.2 mg/dL 0.6 0.3 0.6    2.  Dehydration, rhabdomyolysis with AKI and hyperkalemia.  Continue Foley for now, CT scan nonacute, improving with hydration and Kayexalate, trend CK which is improving.  3.  Transaminitis.  Due to COVID-19 along with rhabdomyolysis, symptom-free no abdominal pain.  CT abdomen pelvis nonacute.  Symptom-free will monitor.  4.  Dyslipidemia.  Home dose statin once able to swallow.  5.  DM type II.  Check A1c, placed on Lantus and sliding scale every 4.  CBG (last 3)  Recent Labs    01/18/19 0008 01/18/19 0500 01/18/19 0758  GLUCAP 73 127* 159*    6.  Toxic encephalopathy.  Improving with supportive care no focal deficits.  7.  Borderline elevated troponin.  Not an ACS pattern, chest pain-free, EKG nonacute, aspirin, beta-blocker, stable echo.  8.  Hypertension.  Stable on Coreg and Norvasc.  9.  Dysphagia and odynophagia.  Soft tissue neck CT unremarkable, could not add contrast due to renal failure, supportive care with antibiotics, will check strep throat.  Speech following and currently on regular diet per speech.  10.  Right-sided lumbosacral discomfort.  Most likely musculoskeletal due to his recent fall at home, CT L-spine and pelvis unremarkable and nonacute.  Supportive care with NSAID  cream.    Condition - Extremely Guarded  Family Communication  :  Wife 12/9, 12/10, 12/12  Code Status :  DNR  Diet :   Diet Order            Diet Carb Modified Fluid consistency: Thin; Room service appropriate? Yes  Diet effective now               Disposition Plan  :   PCU  Consults  :     Procedures  :    CT soft tissue neck, chest abdomen pelvis.  All noncontrast due to AKI.  Nonacute except for bilateral pneumonia.  CT L-spine and pelvis.  Nonacute.  Echocardiogram    1. Left ventricular ejection fraction, by visual estimation, is 60 to 65%. The left ventricle has normal function. There is mildly increased left ventricular hypertrophy.  2. Left ventricular diastolic parameters are consistent with Grade I diastolic dysfunction (impaired relaxation).  3. The left ventricle has no regional wall motion abnormalities.  4. Global right ventricle has normal systolic function.The right ventricular size is normal.  5. Left atrial size was normal.  6. Right atrial size was normal.  7. Mild mitral annular calcification.  8. The mitral valve is normal in structure. No evidence of mitral valve regurgitation. No evidence of mitral stenosis.  9. The tricuspid valve is normal in structure. Tricuspid valve regurgitation is mild. 10. The aortic valve has an indeterminant number of cusps. Aortic valve regurgitation is not visualized. Mild to moderate aortic valve sclerosis/calcification without any evidence of aortic stenosis. 11. The pulmonic valve was not well visualized. Pulmonic valve regurgitation is not visualized. 12. Technically difficult; normal LV   systolic function; grade 1 diastolic dysfunction; mild LVH with mild proximal septal thickening; calcified aortic valve (not well interrogated but no clear AS by doppler).   PUD Prophylaxis : PPI  DVT Prophylaxis  :    Switch to high-dose prophylactic Lovenox on 01/16/2019  Lab Results  Component Value Date   PLT 235  01/18/2019    Inpatient Medications  Scheduled Meds: . amLODipine  10 mg Oral Daily  . aspirin  81 mg Oral Daily  . carvedilol  6.25 mg Oral BID  . Chlorhexidine Gluconate Cloth  6 each Topical Daily  . diclofenac Sodium  2 g Topical QID  . docusate sodium  100 mg Oral BID  . enoxaparin (LOVENOX) injection  0.5 mg/kg Subcutaneous Q12H  . insulin aspart  0-20 Units Subcutaneous Q4H  . insulin aspart  3 Units Subcutaneous TID WC  . insulin glargine  15 Units Subcutaneous Once  . insulin glargine  35 Units Subcutaneous Daily  . methylPREDNISolone (SOLU-MEDROL) injection  60 mg Intravenous Q12H  . pantoprazole (PROTONIX) IV  40 mg Intravenous Q12H  . sodium polystyrene  30 g Oral Once   Continuous Infusions: . ampicillin-sulbactam (UNASYN) IV 3 g (01/18/19 9147)  . remdesivir 100 mg in NS 100 mL 100 mg (01/18/19 0953)   PRN Meds:.acetaminophen, acetaminophen, menthol-cetylpyridinium, metoprolol tartrate, [DISCONTINUED] ondansetron **OR** ondansetron (ZOFRAN) IV, traMADol  Antibiotics  :    Anti-infectives (From admission, onward)   Start     Dose/Rate Route Frequency Ordered Stop   01/16/19 1000  remdesivir 100 mg in sodium chloride 0.9 % 100 mL IVPB  Status:  Discontinued     100 mg 200 mL/hr over 30 Minutes Intravenous Daily 01/15/19 1104 01/15/19 1107   01/16/19 1000  remdesivir 100 mg in sodium chloride 0.9 % 100 mL IVPB     100 mg 200 mL/hr over 30 Minutes Intravenous Daily 01/15/19 1114 01/20/19 0959   01/15/19 1200  remdesivir 200 mg in sodium chloride 0.9% 250 mL IVPB  Status:  Discontinued     200 mg 580 mL/hr over 30 Minutes Intravenous Once 01/15/19 1104 01/15/19 1107   01/15/19 1200  Ampicillin-Sulbactam (UNASYN) 3 g in sodium chloride 0.9 % 100 mL IVPB     3 g 200 mL/hr over 30 Minutes Intravenous Every 6 hours 01/15/19 1107         Time Spent in minutes  30   Lala Lund M.D on 01/18/2019 at 11:33 AM  To page go to [email or web address] - password [facility]  [facility] -  Office  [phone]  See all Orders from today for further details    Objective:   Vitals:   01/18/19 0000 01/18/19 0454 01/18/19 0700 01/18/19 0800  BP: (!) 153/94 122/74 (!) 158/95 (!) 158/95  Pulse:  86  92  Resp: (!) 22 20  (!) 26  Temp: (!) 101.1 F (38.4 C) (!) 97.5 F (36.4 C)  99 F (37.2 C)  TempSrc: Oral Oral  Oral  SpO2:  98%  95%  Weight:      Height:        Wt Readings from Last 3 Encounters:  01/15/19 128.4 kg  01/15/19 128.4 kg     Intake/Output Summary (Last 24 hours) at 01/18/2019 1133 Last data filed at 01/18/2019 0800 Gross per 24 hour  Intake 2594.92 ml  Output 1625 ml  Net 969.92 ml     Physical Exam  Awake Alert, No new F.N deficits, Normal affect [place].AT,PERRAL Supple Neck,No JVD, No  cervical lymphadenopathy appriciated.  Symmetrical Chest wall movement, Good air movement bilaterally, CTAB RRR,No Gallops, Rubs or new Murmurs, No Parasternal Heave +ve B.Sounds, Abd Soft, No tenderness, No organomegaly appriciated, No rebound - guarding or rigidity. No Cyanosis, Clubbing or edema, No new Rash or bruise    Data Review:    CBC Recent Labs  Lab 01/15/19 0257 01/15/19 1136 01/16/19 0445 01/17/19 0345 01/18/19 0357  WBC 8.4 6.6 7.1 8.1 13.1*  HGB 15.3 15.0 13.8 12.0* 11.9*  HCT 49.8 49.6 45.4 39.1 39.8  PLT 236 207 192 198 235  MCV 78.7* 80.5 80.2 79.6* 80.1  MCH 24.2* 24.4* 24.4* 24.4* 23.9*  MCHC 30.7 30.2 30.4 30.7 29.9*  RDW 15.1 15.1 15.1 15.2 15.4  LYMPHSABS 1.4 1.1 0.9 0.6* 0.8  MONOABS 0.6 0.4 0.5 0.4 0.8  EOSABS 0.1 0.0 0.0 0.0 0.0  BASOSABS 0.1 0.1 0.1 0.0 0.0    Chemistries  Recent Labs  Lab 01/15/19 0257 01/15/19 1136 01/16/19 0445 01/17/19 0345 01/18/19 0357  NA 132* 135 140 142 141  K 4.8 5.9* 4.1 4.2 4.3  CL 94* 97* 103 101 100  CO2 20* 26 28 28 29  GLUCOSE 343* 296* 202* 213* 113*  BUN 26* 34* 36* 35* 30*  CREATININE 1.99* 2.31* 1.86* 1.43* 1.32*  CALCIUM 8.2* 8.0* 8.1* 8.0* 7.7*   MG  --   --  2.2 2.6* 2.8*  AST 69* 111* 104* 65* 48*  ALT 50* 55* 48* 40 41  ALKPHOS 63 50 44 49 55  BILITOT 0.6 0.5 0.6 0.3 0.6   ------------------------------------------------------------------------------------------------------------------ No results for input(s): CHOL, HDL, LDLCALC, TRIG, CHOLHDL, LDLDIRECT in the last 72 hours.  Lab Results  Component Value Date   HGBA1C 8.2 (H) 01/15/2019   ------------------------------------------------------------------------------------------------------------------ No results for input(s): TSH, T4TOTAL, T3FREE, THYROIDAB in the last 72 hours.  Invalid input(s): FREET3  Cardiac Enzymes No results for input(s): CKMB, TROPONINI, MYOGLOBIN in the last 168 hours.  Invalid input(s): CK ------------------------------------------------------------------------------------------------------------------    Component Value Date/Time   BNP 134.1 (H) 01/18/2019 0357    Micro Results  Recent Results (from the past 240 hour(s))  Culture, blood (routine x 2)     Status: None (Preliminary result)   Collection Time: 01/15/19  2:57 AM   Specimen: BLOOD  Result Value Ref Range Status   Specimen Description BLOOD RIGHT ANTECUBITAL  Final   Special Requests   Final    BOTTLES DRAWN AEROBIC AND ANAEROBIC Blood Culture results may not be optimal due to an excessive volume of blood received in culture bottles   Culture   Final    NO GROWTH 3 DAYS Performed at [facility], [address] [place], [place] [address]    Report Status PENDING  Incomplete  Culture, blood (routine x 2)     Status: None (Preliminary result)   Collection Time: 01/15/19  2:57 AM   Specimen: BLOOD  Result Value Ref Range Status   Specimen Description BLOOD BLOOD LEFT HAND  Final   Special Requests   Final    BOTTLES DRAWN AEROBIC AND ANAEROBIC Blood Culture results may not be optimal due to an inadequate volume of blood received in culture bottles   Culture    Final    NO GROWTH 3 DAYS Performed at [facility], [address] [place], [place] [address]    Report Status PENDING  Incomplete  Urine culture     Status: None   Collection Time: 01/15/19  9:41 AM     Specimen: Urine, Random  Result Value Ref Range Status   Specimen Description   Final    URINE, RANDOM Performed at [facility], [address] [place], [place] [address]    Special Requests   Final    NONE Performed at [facility], [address] [place], [place] [address]    Culture   Final    NO GROWTH Performed at [facility], [address] [place], [place] [address]    Report Status 01/16/2019 FINAL  Final  Culture, Urine     Status: None   Collection Time: 01/15/19  6:45 PM   Specimen: Urine, Catheterized  Result Value Ref Range Status   Specimen Description   Final    URINE, CATHETERIZED Performed at [facility], [address] [place], [place] [address]    Special Requests   Final    NONE Performed at [facility], [address] [place], [place] [address]    Culture   Final    NO GROWTH Performed at [facility], [address] [place], [place] [address]    Report Status 01/16/2019 FINAL  Final  Culture, respiratory     Status: None (Preliminary result)   Collection Time: 01/15/19  6:45 PM   Specimen: Tracheal Aspirate  Result Value Ref Range Status   Specimen Description   Final    TRACHEAL ASPIRATE Performed at [facility], [address] [place], [place] [address]    Special Requests   Final    NONE Performed at [facility], [address] [place], [place] [address]    Gram Stain   Final    RARE WBC PRESENT, PREDOMINANTLY PMN ABUNDANT GRAM POSITIVE COCCI IN PAIRS IN CLUSTERS ABUNDANT GRAM NEGATIVE RODS FEW GRAM POSITIVE RODS    Culture   Final    CULTURE REINCUBATED FOR BETTER GROWTH Performed at   Hospital Lab, [address] [place],  [address]    Report Status PENDING  Incomplete    Radiology Reports CT ABDOMEN PELVIS WO CONTRAST  Result Date: 01/15/2019 CLINICAL DATA:  Abdominal pain and fever. Abscess suspected. COVID-19 pneumonia. EXAM: CT CHEST, ABDOMEN AND PELVIS WITHOUT CONTRAST TECHNIQUE: Multidetector CT imaging of the chest, abdomen and pelvis was performed following the standard protocol without IV contrast. COMPARISON:  One-view chest x-ray 01/15/2019 FINDINGS: CT CHEST FINDINGS Cardiovascular: The heart size is normal. Scratched at the heart size upper limits of normal. Atherosclerotic calcifications are noted at the aortic valve and aortic arch. There is no aneurysm. Pulmonary artery size is normal. Mediastinum/Nodes: No significant mediastinal hilar, or axillary adenopathy is present. Esophagus is within normal limits. Thoracic inlet is normal. Lungs/Pleura: Bilateral lower lobe airspace consolidation is present. Additional patchy airspace disease is present in the superior segment of both lower lobes and in the right middle lobe. Mild patchy airspace opacities are present right upper lobe as well. Musculoskeletal: Vertebral body heights are maintained. No focal lytic or blastic lesions are present. CT ABDOMEN PELVIS FINDINGS Hepatobiliary: There is diffuse fatty infiltration of the liver. No discrete lesions are present. The common bile duct and gallbladder are within normal limits. Pancreas: Mild fatty infiltration of the pancreas is present. No discrete lesions are present. Spleen: Normal in size without focal abnormality. Adrenals/Urinary Tract: The adrenal glands are normal bilaterally. Kidneys are unremarkable. There is no stone or mass lesion. No obstruction is present. The ureters are within normal limits. The urinary   bladder is decompressed with a Foley catheter in place. Stomach/Bowel: The stomach and duodenum are within limits. Small bowel is unremarkable. Terminal ileum  is normal. Appendix is visualized and within limits. The ascending and transverse colon are normal. Descending and sigmoid colon are normal. Vascular/Lymphatic: Atherosclerotic changes are present in the aorta and branch vessels. Reproductive: Prostate is unremarkable. Other: No abdominal wall hernia or abnormality. No abdominopelvic ascites. Focal fluid collection present to suggest abscess Musculoskeletal: Endplate changes are present at L5-S1. Vertebral body heights are maintained. There is some straightening of lumbar lordosis. Bony pelvis is within normal limits. Hips are located and within normal limits. IMPRESSION: 1. Bilateral lower lobe and right middle lobe airspace disease compatible with multifocal pneumonia. There is likely involvement right upper lobe as well. 2. Hepatic steatosis. 3. No other acute or focal abnormality to explain the patient's abdominal pain. 4. Degenerative changes in the lower lumbar spine. 5. Aortic Atherosclerosis (ICD10-I70.0). Electronically Signed   By: Christopher  Mattern M.D.   On: 01/15/2019 16:34   CT SOFT TISSUE NECK WO CONTRAST  Result Date: 01/15/2019 CLINICAL DATA:  COVID-19 positive.  Sepsis.  Renal insufficiency EXAM: CT NECK WITHOUT CONTRAST TECHNIQUE: Multidetector CT imaging of the neck was performed following the standard protocol without intravenous contrast. COMPARISON:  None. FINDINGS: Pharynx and larynx: Image quality degraded by motion. Normal airway.  No mass or abscess in the larynx. Salivary glands: No inflammation, mass, or stone. Thyroid: Negative Lymph nodes: No enlarged lymph nodes in the neck. Vascular: Limited vascular evaluation without intravenous contrast. Limited intracranial: Negative Visualized orbits: Negative Mastoids and visualized paranasal sinuses: Paranasal sinuses clear. Small osteoma left frontal sinus. Skeleton: Cervical spondylosis.  No acute skeletal abnormality. Upper chest: Negative Other: None IMPRESSION: No acute abnormality  Exam limited by motion and lack of intravenous contrast. Electronically Signed   By: Charles  Clark M.D.   On: 01/15/2019 16:34   CT CHEST WO CONTRAST  Result Date: 01/15/2019 CLINICAL DATA:  Abdominal pain and fever. Abscess suspected. COVID-19 pneumonia. EXAM: CT CHEST, ABDOMEN AND PELVIS WITHOUT CONTRAST TECHNIQUE: Multidetector CT imaging of the chest, abdomen and pelvis was performed following the standard protocol without IV contrast. COMPARISON:  One-view chest x-ray 01/15/2019 FINDINGS: CT CHEST FINDINGS Cardiovascular: The heart size is normal. Scratched at the heart size upper limits of normal. Atherosclerotic calcifications are noted at the aortic valve and aortic arch. There is no aneurysm. Pulmonary artery size is normal. Mediastinum/Nodes: No significant mediastinal hilar, or axillary adenopathy is present. Esophagus is within normal limits. Thoracic inlet is normal. Lungs/Pleura: Bilateral lower lobe airspace consolidation is present. Additional patchy airspace disease is present in the superior segment of both lower lobes and in the right middle lobe. Mild patchy airspace opacities are present right upper lobe as well. Musculoskeletal: Vertebral body heights are maintained. No focal lytic or blastic lesions are present. CT ABDOMEN PELVIS FINDINGS Hepatobiliary: There is diffuse fatty infiltration of the liver. No discrete lesions are present. The common bile duct and gallbladder are within normal limits. Pancreas: Mild fatty infiltration of the pancreas is present. No discrete lesions are present. Spleen: Normal in size without focal abnormality. Adrenals/Urinary Tract: The adrenal glands are normal bilaterally. Kidneys are unremarkable. There is no stone or mass lesion. No obstruction is present. The ureters are within normal limits. The urinary bladder is decompressed with a Foley catheter in place. Stomach/Bowel: The stomach and duodenum are within limits. Small bowel is unremarkable. Terminal  ileum is normal. Appendix is visualized and within   limits. The ascending and transverse colon are normal. Descending and sigmoid colon are normal. Vascular/Lymphatic: Atherosclerotic changes are present in the aorta and branch vessels. Reproductive: Prostate is unremarkable. Other: No abdominal wall hernia or abnormality. No abdominopelvic ascites. Focal fluid collection present to suggest abscess Musculoskeletal: Endplate changes are present at L5-S1. Vertebral body heights are maintained. There is some straightening of lumbar lordosis. Bony pelvis is within normal limits. Hips are located and within normal limits. IMPRESSION: 1. Bilateral lower lobe and right middle lobe airspace disease compatible with multifocal pneumonia. There is likely involvement right upper lobe as well. 2. Hepatic steatosis. 3. No other acute or focal abnormality to explain the patient's abdominal pain. 4. Degenerative changes in the lower lumbar spine. 5. Aortic Atherosclerosis (ICD10-I70.0). Electronically Signed   By: Christopher  Mattern M.D.   On: 01/15/2019 16:34   CT LUMBAR SPINE WO CONTRAST  Result Date: 01/17/2019 CLINICAL DATA:  Right lumbosacral pain radiating to the leg. Low back pain with increased fracture risk EXAM: CT LUMBAR SPINE WITHOUT CONTRAST TECHNIQUE: Multidetector CT imaging of the lumbar spine was performed without intravenous contrast administration. Multiplanar CT image reconstructions were also generated. COMPARISON:  None. FINDINGS: Segmentation: 5 lumbar type vertebrae Alignment: Normal Vertebrae: No evidence of fracture, discitis, or aggressive bone lesion Paraspinal and other soft tissues: Bilateral pneumonia in this patient with known COVID-19. Disc levels: Disc narrowing and endplate degeneration at L4-5. Mild facet spurring at L4-5 and below IMPRESSION: 1. No acute finding in the lumbar spine. 2. L4-5 moderate disc degeneration. Electronically Signed   By: Jonathon  Watts M.D.   On: 01/17/2019 09:40    CT PELVIS WO CONTRAST  Result Date: 01/17/2019 CLINICAL DATA:  Pelvic fracture. EXAM: CT PELVIS WITHOUT CONTRAST TECHNIQUE: Multidetector CT imaging of the pelvis was performed following the standard protocol without intravenous contrast. COMPARISON:  January 15, 2019. FINDINGS: Urinary Tract: Urinary bladder is decompressed secondary to Foley catheter. Bowel:  Unremarkable visualized pelvic bowel loops. Vascular/Lymphatic: Atherosclerosis of visualized abdominal aorta and iliac arteries is noted. No adenopathy is noted. Reproductive:  No mass or other significant abnormality Other:  None. Musculoskeletal: No suspicious bone lesions identified. IMPRESSION: No definite evidence of pelvic fracture or other significant bony abnormality. Aortic Atherosclerosis (ICD10-I70.0). Electronically Signed   By: James  Green Jr M.D.   On: 01/17/2019 09:45   CXR am  Result Date: 01/15/2019 CLINICAL DATA:  Acute shortness of breath. Follow-up COVID-19 pneumonia. EXAM: PORTABLE CHEST 1 VIEW 11:16 a.m.: COMPARISON:  Portable chest x-ray earlier same day at 2:55 a.m. FINDINGS: Cardiac silhouette enlarged for AP portable technique, unchanged. Progressive airspace consolidation involving the lower lobes and the RIGHT MIDDLE LOBE since the examination earlier this morning. Streaky opacities in the lingula. UPPER lobes remain clear otherwise. Normal pulmonary vascularity. IMPRESSION: 1. Progressive bilateral lower lobe pneumonia since the examination earlier this morning. 2. Streaky opacities in the lingula, likely atelectasis. 3. Stable cardiomegaly without pulmonary edema. Electronically Signed   By: Thomas  Lawrence M.D.   On: 01/15/2019 11:26   DG Chest Port 1 View  Result Date: 01/15/2019 CLINICAL DATA:  Shortness of breath. EXAM: PORTABLE CHEST 1 VIEW COMPARISON:  None. FINDINGS: The heart is enlarged. Patchy right greater than left airspace opacities in the lung bases. No pulmonary edema. No definite pleural  effusion. No pneumothorax. No acute osseous abnormalities are seen. IMPRESSION: 1. Right greater than left basilar airspace opacities, suspicious for pneumonia. 2. Mild cardiomegaly. Electronically Signed   By: Melanie  Sanford M.D.   On:   01/15/2019 03:19   ECHOCARDIOGRAM COMPLETE  Result Date: 01/16/2019   ECHOCARDIOGRAM REPORT   Patient Name:   Koree Sainsbury Date of Exam: 01/16/2019 Medical Rec #:  [id number]    Height: Accession #:    [id number]   Weight:       283.1 lb Date of Birth:  12/23/1955   BSA:          2.55 m Patient Age:    63 years     BP:           155/98 mmHg Patient Gender: M            HR:           105 bpm. Exam Location:  Inpatient Procedure: 2D Echo Indications:    Chest Pain 786.50 / R07.9  History:        Patient has no prior history of Echocardiogram examinations.                 Risk Factors:Hypertension and Diabetes. COVID 19.  Sonographer:    Tiffany Cooper RDCS Referring Phys: [id number] PRASHANT K SINGH  Sonographer Comments: Echo performed with the patient sitting upright in his chair IMPRESSIONS  1. Left ventricular ejection fraction, by visual estimation, is 60 to 65%. The left ventricle has normal function. There is mildly increased left ventricular hypertrophy.  2. Left ventricular diastolic parameters are consistent with Grade I diastolic dysfunction (impaired relaxation).  3. The left ventricle has no regional wall motion abnormalities.  4. Global right ventricle has normal systolic function.The right ventricular size is normal.  5. Left atrial size was normal.  6. Right atrial size was normal.  7. Mild mitral annular calcification.  8. The mitral valve is normal in structure. No evidence of mitral valve regurgitation. No evidence of mitral stenosis.  9. The tricuspid valve is normal in structure. Tricuspid valve regurgitation is mild. 10. The aortic valve has an indeterminant number of cusps. Aortic valve regurgitation is not visualized. Mild to moderate aortic valve  sclerosis/calcification without any evidence of aortic stenosis. 11. The pulmonic valve was not well visualized. Pulmonic valve regurgitation is not visualized. 12. Technically difficult; normal LV systolic function; grade 1 diastolic dysfunction; mild LVH with mild proximal septal thickening; calcified aortic valve (not well interrogated but no clear AS by doppler). FINDINGS  Left Ventricle: Left ventricular ejection fraction, by visual estimation, is 60 to 65%. The left ventricle has normal function. The left ventricle has no regional wall motion abnormalities. There is mildly increased left ventricular hypertrophy. Left ventricular diastolic parameters are consistent with Grade I diastolic dysfunction (impaired relaxation). Normal left atrial pressure. Right Ventricle: The right ventricular size is normal.Global RV systolic function is has normal systolic function. Left Atrium: Left atrial size was normal in size. Right Atrium: Right atrial size was normal in size Pericardium: There is no evidence of pericardial effusion. Mitral Valve: The mitral valve is normal in structure. Mild mitral annular calcification. No evidence of mitral valve regurgitation. No evidence of mitral valve stenosis by observation. Tricuspid Valve: The tricuspid valve is normal in structure. Tricuspid valve regurgitation is mild. Aortic Valve: The aortic valve has an indeterminant number of cusps. Aortic valve regurgitation is not visualized. Mild to moderate aortic valve sclerosis/calcification is present, without any evidence of aortic stenosis. Pulmonic Valve: The pulmonic valve was not well visualized. Pulmonic valve regurgitation is not visualized. Pulmonic regurgitation is not visualized. Aorta: The aortic root is normal in size and structure. Venous: The inferior vena   cava was not well visualized.  Additional Comments: Technically difficult; normal LV systolic function; grade 1 diastolic dysfunction; mild LVH with mild proximal  septal thickening; calcified aortic valve (not well interrogated but no clear AS by doppler).  LEFT VENTRICLE PLAX 2D LVIDd:         3.78 cm  Diastology LVIDs:         3.04 cm  LV e' lateral:   10.60 cm/s LV PW:         0.90 cm  LV E/e' lateral: 9.0 LV IVS:        1.60 cm  LV e' medial:    6.96 cm/s LVOT diam:     1.90 cm  LV E/e' medial:  13.7 LV SV:         25 ml LVOT Area:     2.84 cm  LEFT ATRIUM             Index LA diam:        3.50 cm 1.37 cm/m LA Vol (A2C):   64.9 ml 25.50 ml/m LA Vol (A4C):   37.7 ml 14.81 ml/m LA Biplane Vol: 54.7 ml 21.49 ml/m  AORTIC VALVE LVOT Vmax:   99.20 cm/s LVOT Vmean:  59.300 cm/s LVOT VTI:    0.156 m  AORTA Ao Root diam: 3.10 cm MITRAL VALVE                         TRICUSPID VALVE MV Area (PHT): 7.09 cm              TR Peak grad:   35.0 mmHg MV PHT:        31.03 msec            TR Vmax:        296.00 cm/s MV Decel Time: 107 msec MV E velocity: 95.50 cm/s  103 cm/s  SHUNTS MV A velocity: 120.00 cm/s 70.3 cm/s Systemic VTI:  0.16 m MV E/A ratio:  0.80        1.5       Systemic Diam: 1.90 cm  Brian Crenshaw MD Electronically signed by Brian Crenshaw MD Signature Date/Time: 01/16/2019/1:35:47 PM    Final      

## 2019-01-18 NOTE — Plan of Care (Signed)
  Problem: Coping: Goal: Psychosocial and spiritual needs will be supported Outcome: Not Progressing   Problem: Respiratory: Goal: Will maintain a patent airway Outcome: Not Progressing   

## 2019-01-18 NOTE — Plan of Care (Signed)
  Problem: Education: Goal: Knowledge of risk factors and measures for prevention of condition will improve Outcome: Progressing   Problem: Coping: Goal: Psychosocial and spiritual needs will be supported Outcome: Progressing   Problem: Respiratory: Goal: Will maintain a patent airway Outcome: Progressing Goal: Complications related to the disease process, condition or treatment will be avoided or minimized Outcome: Progressing   

## 2019-01-18 NOTE — Progress Notes (Signed)
Ok to change prophylaxis dose lovenox to 0.5mg /kg/day starting 12/13 per Dr Candiss Norse.  Onnie Boer, PharmD, BCIDP, AAHIVP, CPP Infectious Disease Pharmacist 01/18/2019 1:25 PM

## 2019-01-18 NOTE — Progress Notes (Signed)
Temp 101.1 F orally, RR=22.POX=96% 15LHFNC. HR=93. Pt was given tylenol 650mg  PO. Hostpitals T. Vanita Ingles was made aware

## 2019-01-19 LAB — CBC WITH DIFFERENTIAL/PLATELET
Abs Immature Granulocytes: 0.36 10*3/uL — ABNORMAL HIGH (ref 0.00–0.07)
Basophils Absolute: 0 10*3/uL (ref 0.0–0.1)
Basophils Relative: 0 %
Eosinophils Absolute: 0 10*3/uL (ref 0.0–0.5)
Eosinophils Relative: 0 %
HCT: 40.4 % (ref 39.0–52.0)
Hemoglobin: 12.4 g/dL — ABNORMAL LOW (ref 13.0–17.0)
Immature Granulocytes: 3 %
Lymphocytes Relative: 10 %
Lymphs Abs: 1.3 10*3/uL (ref 0.7–4.0)
MCH: 24.4 pg — ABNORMAL LOW (ref 26.0–34.0)
MCHC: 30.7 g/dL (ref 30.0–36.0)
MCV: 79.5 fL — ABNORMAL LOW (ref 80.0–100.0)
Monocytes Absolute: 1.2 10*3/uL — ABNORMAL HIGH (ref 0.1–1.0)
Monocytes Relative: 9 %
Neutro Abs: 11 10*3/uL — ABNORMAL HIGH (ref 1.7–7.7)
Neutrophils Relative %: 78 %
Platelets: 270 10*3/uL (ref 150–400)
RBC: 5.08 MIL/uL (ref 4.22–5.81)
RDW: 15.2 % (ref 11.5–15.5)
WBC: 13.9 10*3/uL — ABNORMAL HIGH (ref 4.0–10.5)
nRBC: 0 % (ref 0.0–0.2)

## 2019-01-19 LAB — COMPREHENSIVE METABOLIC PANEL
ALT: 36 U/L (ref 0–44)
AST: 37 U/L (ref 15–41)
Albumin: 2.7 g/dL — ABNORMAL LOW (ref 3.5–5.0)
Alkaline Phosphatase: 68 U/L (ref 38–126)
Anion gap: 10 (ref 5–15)
BUN: 26 mg/dL — ABNORMAL HIGH (ref 8–23)
CO2: 33 mmol/L — ABNORMAL HIGH (ref 22–32)
Calcium: 7.6 mg/dL — ABNORMAL LOW (ref 8.9–10.3)
Chloride: 98 mmol/L (ref 98–111)
Creatinine, Ser: 1.4 mg/dL — ABNORMAL HIGH (ref 0.61–1.24)
GFR calc Af Amer: >60 mL/min (ref >60)
GFR calc non Af Amer: 53 mL/min — ABNORMAL LOW (ref >60)
Glucose, Bld: 51 mg/dL — ABNORMAL LOW (ref 70–99)
Potassium: 3.9 mmol/L (ref 3.5–5.1)
Sodium: 141 mmol/L (ref 135–145)
Total Bilirubin: 0.9 mg/dL (ref 0.3–1.2)
Total Protein: 6.3 g/dL — ABNORMAL LOW (ref 6.5–8.1)

## 2019-01-19 LAB — CULTURE, RESPIRATORY W GRAM STAIN

## 2019-01-19 LAB — GLUCOSE, CAPILLARY
Glucose-Capillary: 139 mg/dL — ABNORMAL HIGH (ref 70–99)
Glucose-Capillary: 150 mg/dL — ABNORMAL HIGH (ref 70–99)
Glucose-Capillary: 324 mg/dL — ABNORMAL HIGH (ref 70–99)
Glucose-Capillary: 328 mg/dL — ABNORMAL HIGH (ref 70–99)
Glucose-Capillary: 47 mg/dL — ABNORMAL LOW (ref 70–99)
Glucose-Capillary: 79 mg/dL (ref 70–99)
Glucose-Capillary: 94 mg/dL (ref 70–99)

## 2019-01-19 LAB — C-REACTIVE PROTEIN: CRP: 11.3 mg/dL — ABNORMAL HIGH (ref <1.0)

## 2019-01-19 LAB — MAGNESIUM: Magnesium: 2.5 mg/dL — ABNORMAL HIGH (ref 1.7–2.4)

## 2019-01-19 LAB — D-DIMER, QUANTITATIVE: D-Dimer, Quant: 1.77 ug/mL-FEU — ABNORMAL HIGH (ref 0.00–0.50)

## 2019-01-19 LAB — BRAIN NATRIURETIC PEPTIDE: B Natriuretic Peptide: 167.1 pg/mL — ABNORMAL HIGH (ref 0.0–100.0)

## 2019-01-19 MED ORDER — INSULIN GLARGINE 100 UNIT/ML ~~LOC~~ SOLN
20.0000 [IU] | Freq: Every day | SUBCUTANEOUS | Status: DC
  Administered 2019-01-20 – 2019-01-21 (×2): 20 [IU] via SUBCUTANEOUS
  Filled 2019-01-19 (×2): qty 0.2

## 2019-01-19 MED ORDER — FUROSEMIDE 10 MG/ML IJ SOLN
40.0000 mg | Freq: Once | INTRAMUSCULAR | Status: AC
  Administered 2019-01-19: 40 mg via INTRAVENOUS
  Filled 2019-01-19: qty 4

## 2019-01-19 MED ORDER — METHYLPREDNISOLONE SODIUM SUCC 40 MG IJ SOLR
40.0000 mg | Freq: Every day | INTRAMUSCULAR | Status: DC
  Administered 2019-01-20 – 2019-01-21 (×2): 40 mg via INTRAVENOUS
  Filled 2019-01-19 (×2): qty 1

## 2019-01-19 NOTE — Progress Notes (Signed)
   Vital Signs MEWS/VS Documentation      01/18/2019 2200 01/19/2019 0000 01/19/2019 0400 01/19/2019 0748   MEWS Score:  --  2  2  2    MEWS Score Color:  --  Yellow  Yellow  Yellow   Resp:  --  --  --  (!) 23   Pulse:  --  --  --  100   BP:  --  --  (!) 145/74  133/76   Temp:  --  98.9 F (37.2 C)  99.7 F (37.6 C)  (!) 100.7 F (38.2 C)   O2 Device:  HFNC  HFNC  HFNC  HFNC   O2 Flow Rate (L/min):  9 L/min  --  10 L/min  10 L/min   Level of Consciousness:  --  --  --  Alert      Pt found to with Yellow MEWS Score this morning. Pt assessed, MEWS still yellow with a score of 2 due to temp and RR. Will follow MEWS protocol and cont to monitor.      Christella Hartigan 01/19/2019,8:15 AM

## 2019-01-19 NOTE — Progress Notes (Signed)
PROGRESS NOTE                                                                                                                                                                                                             Patient Demographics:    Austin Shannon, is a 63 y.o. male, DOB - Dec 17, 1955, VKP:[id number]  Outpatient Primary MD for the patient is Patient, No Pcp Per    LOS - 4  Admit date - 01/15/2019    CC - AMS     Brief Narrative -  Austin Shannon  is a 63 y.o. 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   Subjective:   Patient in bed, appears comfortable, denies any headache, no fever, no chest pain or pressure, says much improved cough and shortness of breath , no abdominal pain. No focal weakness.   Assessment  & Plan :     1.  Sepsis, septic shock, COVID-19 pneumonia, with concurrent bacterial infection.    He had extremely high procalcitonin in the range of 100, elevated lactic acid, elevated creatinine and high CRP.  He also has history of treated TB in the past.  He was started on IV remdesivir, IV steroids, IV empiric antibiotics which are Unasyn and doxycycline combination.  Clinically he has improved, noncontrast CT neck, chest, abdomen and pelvis unremarkable.  There is question if he is aspirated as he has some gurgling on exam for which speech has been consulted.  Currently n.p.o.  Continue supportive care.  Still quite sick and  tenuous but better than he was at the time of admission.    Continue to monitor blood cultures.  Respiratory cultures growing few colonies of staph aureus and he is already on doxycycline.   SpO2: 91 % O2 Flow Rate (L/min): 10 L/min  Recent Labs  Lab 01/15/19 0257 01/15/19  1136 01/16/19 0445 01/17/19 0345 01/18/19 0357 01/19/19 0609 01/19/19 0610  CRP 24.2* 35.6* 43.8* 21.3* 11.9*  --  11.3*  DDIMER  --  4.41* 5.04* 3.33* 1.93* 1.77*  --   FERRITIN 402*  --   --   --   --   --   --   BNP 161.0* 83.4 61.0 82.0 134.1* 167.1*  --   PROCALCITON 74.80 111.36 96.21 53.75 27.77  --   --     Hepatic Function Latest Ref Rng & Units 01/19/2019 01/18/2019 01/17/2019  Total Protein 6.5 - 8.1 g/dL 6.3(L) 6.5 6.3(L)  Albumin 3.5 - 5.0 g/dL 2.7(L) 2.9(L) 2.8(L)  AST 15 - 41 U/L 37 48(H) 65(H)  ALT 0 - 44 U/L 36 41 40  Alk Phosphatase 38 - 126 U/L 68 55 49  Total Bilirubin 0.3 - 1.2 mg/dL 0.9 0.6 0.3    2.  Dehydration, rhabdomyolysis with AKI and hyperkalemia.  Continue Foley for now, CT scan nonacute, improving with hydration and Kayexalate, CK has improved.  IV fluids stopped.  3.  Transaminitis.  Due to COVID-19 along with rhabdomyolysis, symptom-free no abdominal pain.  CT abdomen pelvis nonacute.  Symptom-free and resolved.  4.  Dyslipidemia.  Home dose statin once able to swallow.  5.  DM type II.  Reduced Lantus, only sliding scale.  Poor outpatient glycemic control due to hyperglycemia.  Diabetic and insulin education ordered.  CBG (last 3)  Recent Labs    01/19/19 0334 01/19/19 0748 01/19/19 0823  GLUCAP 94 47* 79   Lab Results  Component Value Date   HGBA1C 8.2 (H) 01/15/2019    6.  Toxic encephalopathy.  Improving with supportive care no focal deficits.  7.  Borderline elevated troponin.  Not an ACS pattern, chest pain-free, EKG nonacute, aspirin, beta-blocker, stable echo.  8.  Hypertension.  Stable on Coreg and Norvasc.  9.  Dysphagia and odynophagia.   Soft tissue neck CT unremarkable, could not add contrast due to renal failure, supportive care with antibiotics, throat discomfort has resolved.  Speech following and currently on regular diet per speech.  10.  Right-sided lumbosacral discomfort.  Most likely musculoskeletal due to his recent fall at home, CT L-spine and pelvis unremarkable and nonacute.  Supportive care with NSAID cream, much improved.  11.Mild acute on chronic diastolic CHF EF 36% on echocardiogram.  Gentle Lasix and beta-blocker.   Condition - Extremely Guarded  Family Communication  :  Wife 12/9, 12/10, 12/12  Code Status :  DNR  Diet :   Diet Order            Diet Carb Modified Fluid consistency: Thin; Room service appropriate? Yes  Diet effective now               Disposition Plan  :   PCU  Consults  :     Procedures  :    CT soft tissue neck, chest abdomen pelvis.  All noncontrast due to AKI.  Nonacute except for bilateral pneumonia.  CT L-spine and pelvis.  Nonacute.  Echocardiogram    1. Left ventricular ejection fraction, by visual estimation, is 60 to 65%. The left ventricle has normal function. There is mildly increased left ventricular hypertrophy.  2. Left ventricular diastolic parameters are consistent with Grade I diastolic dysfunction (impaired relaxation).  3. The left ventricle has no regional wall motion abnormalities.  4. Global right ventricle has normal systolic function.The right ventricular size is normal.  5. Left atrial size was  normal.  6. Right atrial size was normal.  7. Mild mitral annular calcification.  8. The mitral valve is normal in structure. No evidence of mitral valve regurgitation. No evidence of mitral stenosis.  9. The tricuspid valve is normal in structure. Tricuspid valve regurgitation is mild. 10. The aortic valve has an indeterminant number of cusps. Aortic valve regurgitation is not visualized. Mild to moderate aortic valve sclerosis/calcification without  any evidence of aortic stenosis. 11. The pulmonic valve was not well visualized. Pulmonic valve regurgitation is not visualized. 12. Technically difficult; normal LV systolic function; grade 1 diastolic dysfunction; mild LVH with mild proximal septal thickening; calcified aortic valve (not well interrogated but no clear AS by doppler).   PUD Prophylaxis : PPI  DVT Prophylaxis  :    Switch to high-dose prophylactic Lovenox on 01/16/2019  Lab Results  Component Value Date   PLT 270 01/19/2019    Inpatient Medications  Scheduled Meds: . amLODipine  10 mg Oral Daily  . aspirin  81 mg Oral Daily  . carvedilol  6.25 mg Oral BID  . Chlorhexidine Gluconate Cloth  6 each Topical Daily  . diclofenac Sodium  2 g Topical QID  . docusate sodium  100 mg Oral BID  . enoxaparin (LOVENOX) injection  65 mg Subcutaneous Q24H  . furosemide  40 mg Intravenous Once  . insulin aspart  0-20 Units Subcutaneous Q4H  . insulin aspart  3 Units Subcutaneous TID WC  . insulin glargine  35 Units Subcutaneous Daily  . [START ON 01/20/2019] methylPREDNISolone (SOLU-MEDROL) injection  40 mg Intravenous Daily  . pantoprazole (PROTONIX) IV  40 mg Intravenous Q12H   Continuous Infusions: . ampicillin-sulbactam (UNASYN) IV 3 g (01/19/19 0512)   PRN Meds:.acetaminophen, acetaminophen, menthol-cetylpyridinium, metoprolol tartrate, [DISCONTINUED] ondansetron **OR** ondansetron (ZOFRAN) IV, traMADol  Antibiotics  :    Anti-infectives (From admission, onward)   Start     Dose/Rate Route Frequency Ordered Stop   01/16/19 1000  remdesivir 100 mg in sodium chloride 0.9 % 100 mL IVPB  Status:  Discontinued     100 mg 200 mL/hr over 30 Minutes Intravenous Daily 01/15/19 1104 01/15/19 1107   01/16/19 1000  remdesivir 100 mg in sodium chloride 0.9 % 100 mL IVPB     100 mg 200 mL/hr over 30 Minutes Intravenous Daily 01/15/19 1114 01/19/19 1054   01/15/19 1200  remdesivir 200 mg in sodium chloride 0.9% 250 mL IVPB   Status:  Discontinued     200 mg 580 mL/hr over 30 Minutes Intravenous Once 01/15/19 1104 01/15/19 1107   01/15/19 1200  Ampicillin-Sulbactam (UNASYN) 3 g in sodium chloride 0.9 % 100 mL IVPB     3 g 200 mL/hr over 30 Minutes Intravenous Every 6 hours 01/15/19 1107         Time Spent in minutes  30   Lala Lund M.D on 01/19/2019 at 11:03 AM  To page go to [email or web address] - password [facility]  [facility] -  Office  [phone]  See all Orders from today for further details    Objective:   Vitals:   01/19/19 0000 01/19/19 0400 01/19/19 0748 01/19/19 1018  BP:  (!) 145/74 133/76 138/78  Pulse:   100 98  Resp:   (!) 23 20  Temp: 98.9 F (37.2 C) 99.7 F (37.6 C) (!) 100.7 F (38.2 C) 99.5 F (37.5 C)  TempSrc: Oral Oral Oral Oral  SpO2:    91%  Weight:      Height:  Wt Readings from Last 3 Encounters:  01/15/19 128.4 kg  01/15/19 128.4 kg     Intake/Output Summary (Last 24 hours) at 01/19/2019 1103 Last data filed at 01/19/2019 8416 Gross per 24 hour  Intake 580 ml  Output 1650 ml  Net -1070 ml     Physical Exam  Awake Alert, Oriented X 3, No new F.N deficits, Normal affect [place].AT,PERRAL Supple Neck,No JVD, No cervical lymphadenopathy appriciated.  Symmetrical Chest wall movement, Good air movement bilaterally, Coarse B sounds RRR,No Gallops, Rubs or new Murmurs, No Parasternal Heave +ve B.Sounds, Abd Soft, No tenderness, No organomegaly appriciated, No rebound - guarding or rigidity. No Cyanosis, Clubbing or edema, No new Rash or bruise    Data Review:    CBC Recent Labs  Lab 01/15/19 1136 01/16/19 0445 01/17/19 0345 01/18/19 0357 01/19/19 0609  WBC 6.6 7.1 8.1 13.1* 13.9*  HGB 15.0 13.8 12.0* 11.9* 12.4*  HCT 49.6 45.4 39.1 39.8 40.4  PLT 207 192 198 235 270  MCV 80.5 80.2 79.6* 80.1 79.5*  MCH 24.4* 24.4* 24.4* 23.9* 24.4*  MCHC 30.2 30.4 30.7 29.9* 30.7  RDW 15.1 15.1 15.2 15.4 15.2  LYMPHSABS 1.1 0.9 0.6* 0.8 1.3    MONOABS 0.4 0.5 0.4 0.8 1.2*  EOSABS 0.0 0.0 0.0 0.0 0.0  BASOSABS 0.1 0.1 0.0 0.0 0.0    Chemistries  Recent Labs  Lab 01/15/19 1136 01/16/19 0445 01/17/19 0345 01/18/19 0357 01/19/19 0609  NA 135 140 142 141 141  K 5.9* 4.1 4.2 4.3 3.9  CL 97* 103 101 100 98  CO2 26 28 28 29  33*  GLUCOSE 296* 202* 213* 113* 51*  BUN 34* 36* 35* 30* 26*  CREATININE 2.31* 1.86* 1.43* 1.32* 1.40*  CALCIUM 8.0* 8.1* 8.0* 7.7* 7.6*  MG  --  2.2 2.6* 2.8* 2.5*  AST 111* 104* 65* 48* 37  ALT 55* 48* 40 41 36  ALKPHOS 50 44 49 55 68  BILITOT 0.5 0.6 0.3 0.6 0.9   ------------------------------------------------------------------------------------------------------------------ No results for input(s): CHOL, HDL, LDLCALC, TRIG, CHOLHDL, LDLDIRECT in the last 72 hours.  Lab Results  Component Value Date   HGBA1C 8.2 (H) 01/15/2019   ------------------------------------------------------------------------------------------------------------------ No results for input(s): TSH, T4TOTAL, T3FREE, THYROIDAB in the last 72 hours.  Invalid input(s): FREET3  Cardiac Enzymes No results for input(s): CKMB, TROPONINI, MYOGLOBIN in the last 168 hours.  Invalid input(s): CK ------------------------------------------------------------------------------------------------------------------    Component Value Date/Time   BNP 167.1 (H) 01/19/2019 6063    Micro Results  Recent Results (from the past 240 hour(s))  Culture, blood (routine x 2)     Status: None (Preliminary result)   Collection Time: 01/15/19  2:57 AM   Specimen: BLOOD  Result Value Ref Range Status   Specimen Description BLOOD RIGHT ANTECUBITAL  Final   Special Requests   Final    BOTTLES DRAWN AEROBIC AND ANAEROBIC Blood Culture results may not be optimal due to an excessive volume of blood received in culture bottles   Culture   Final    NO GROWTH 4 DAYS Performed at [facility], [address] [place], [place]  [address]    Report Status PENDING  Incomplete  Culture, blood (routine x 2)     Status: None (Preliminary result)   Collection Time: 01/15/19  2:57 AM   Specimen: BLOOD  Result Value Ref Range Status   Specimen Description BLOOD BLOOD LEFT HAND  Final   Special Requests   Final    BOTTLES DRAWN  AEROBIC AND ANAEROBIC Blood Culture results may not be optimal due to an inadequate volume of blood received in culture bottles   Culture   Final    NO GROWTH 4 DAYS Performed at [facility], [address] [place], [place] [address]    Report Status PENDING  Incomplete  Urine culture     Status: None   Collection Time: 01/15/19  9:41 AM   Specimen: Urine, Random  Result Value Ref Range Status   Specimen Description   Final    URINE, RANDOM Performed at [facility], [address], [place], [place] [address]    Special Requests   Final    NONE Performed at [facility], [address], [place], [place] [address]    Culture   Final    NO GROWTH Performed at [facility], [place] [address], [place], [place] [address]    Report Status 01/16/2019 FINAL  Final  Culture, Urine     Status: None   Collection Time: 01/15/19  6:45 PM   Specimen: Urine, Catheterized  Result Value Ref Range Status   Specimen Description   Final    URINE, CATHETERIZED Performed at [place] [address], [place], [place] [address]    Special Requests   Final    NONE Performed at [facility], [place] [address] [place], [place] [address]    Culture   Final    NO GROWTH Performed at [facility], [place] [address], [place], [place] [address]    Report Status 01/16/2019 FINAL  Final  Culture, respiratory     Status: None   Collection Time: 01/15/19  6:45 PM   Specimen: Tracheal Aspirate  Result Value Ref Range Status   Specimen Description   Final    TRACHEAL ASPIRATE Performed at [place]  [address], [place], [place] [address]    Special Requests   Final    NONE Performed at [facility], [place] [address], [place], [place] [address]    Gram Stain   Final    RARE WBC PRESENT, PREDOMINANTLY PMN ABUNDANT GRAM POSITIVE COCCI IN PAIRS IN CLUSTERS ABUNDANT GRAM NEGATIVE RODS FEW GRAM POSITIVE RODS Performed at [facility], [place] [address] [place], [place] [address]    Culture FEW STAPHYLOCOCCUS AUREUS  Final   Report Status 01/19/2019 FINAL  Final   Organism ID, Bacteria STAPHYLOCOCCUS AUREUS  Final      Susceptibility   Staphylococcus aureus - MIC*    CIPROFLOXACIN <=0.5 SENSITIVE Sensitive     ERYTHROMYCIN <=0.25 SENSITIVE Sensitive     GENTAMICIN <=0.5 SENSITIVE Sensitive     OXACILLIN 0.5 SENSITIVE Sensitive     TETRACYCLINE <=1 SENSITIVE Sensitive     VANCOMYCIN 1 SENSITIVE Sensitive     TRIMETH/SULFA <=10 SENSITIVE Sensitive     CLINDAMYCIN <=0.25 SENSITIVE Sensitive     RIFAMPIN <=0.5 SENSITIVE Sensitive     Inducible Clindamycin NEGATIVE Sensitive     * FEW STAPHYLOCOCCUS AUREUS    Radiology Reports CT ABDOMEN PELVIS WO CONTRAST  Result Date: 01/15/2019 CLINICAL DATA:  Abdominal pain and fever. Abscess suspected. COVID-19 pneumonia. EXAM: CT CHEST, ABDOMEN AND PELVIS WITHOUT CONTRAST TECHNIQUE: Multidetector CT imaging of the chest, abdomen and pelvis was performed following the standard protocol without IV contrast. COMPARISON:  One-view chest x-ray 01/15/2019 FINDINGS: CT CHEST FINDINGS Cardiovascular: The heart size is normal. Scratched at the heart size upper limits  of normal. Atherosclerotic calcifications are noted at the aortic valve and aortic arch. There is no aneurysm. Pulmonary artery size is normal. Mediastinum/Nodes: No significant mediastinal hilar, or axillary adenopathy is present. Esophagus is within normal limits. Thoracic inlet is normal. Lungs/Pleura: Bilateral lower lobe airspace consolidation is present. Additional patchy  airspace disease is present in the superior segment of both lower lobes and in the right middle lobe. Mild patchy airspace opacities are present right upper lobe as well. Musculoskeletal: Vertebral body heights are maintained. No focal lytic or blastic lesions are present. CT ABDOMEN PELVIS FINDINGS Hepatobiliary: There is diffuse fatty infiltration of the liver. No discrete lesions are present. The common bile duct and gallbladder are within normal limits. Pancreas: Mild fatty infiltration of the pancreas is present. No discrete lesions are present. Spleen: Normal in size without focal abnormality. Adrenals/Urinary Tract: The adrenal glands are normal bilaterally. Kidneys are unremarkable. There is no stone or mass lesion. No obstruction is present. The ureters are within normal limits. The urinary bladder is decompressed with a Foley catheter in place. Stomach/Bowel: The stomach and duodenum are within limits. Small bowel is unremarkable. Terminal ileum is normal. Appendix is visualized and within limits. The ascending and transverse colon are normal. Descending and sigmoid colon are normal. Vascular/Lymphatic: Atherosclerotic changes are present in the aorta and branch vessels. Reproductive: Prostate is unremarkable. Other: No abdominal wall hernia or abnormality. No abdominopelvic ascites. Focal fluid collection present to suggest abscess Musculoskeletal: Endplate changes are present at L5-S1. Vertebral body heights are maintained. There is some straightening of lumbar lordosis. Bony pelvis is within normal limits. Hips are located and within normal limits. IMPRESSION: 1. Bilateral lower lobe and right middle lobe airspace disease compatible with multifocal pneumonia. There is likely involvement right upper lobe as well. 2. Hepatic steatosis. 3. No other acute or focal abnormality to explain the patient's abdominal pain. 4. Degenerative changes in the lower lumbar spine. 5. Aortic Atherosclerosis (ICD10-I70.0).  Electronically Signed   By: San Morelle M.D.   On: 01/15/2019 16:34   CT SOFT TISSUE NECK WO CONTRAST  Result Date: 01/15/2019 CLINICAL DATA:  COVID-19 positive.  Sepsis.  Renal insufficiency EXAM: CT NECK WITHOUT CONTRAST TECHNIQUE: Multidetector CT imaging of the neck was performed following the standard protocol without intravenous contrast. COMPARISON:  None. FINDINGS: Pharynx and larynx: Image quality degraded by motion. Normal airway.  No mass or abscess in the larynx. Salivary glands: No inflammation, mass, or stone. Thyroid: Negative Lymph nodes: No enlarged lymph nodes in the neck. Vascular: Limited vascular evaluation without intravenous contrast. Limited intracranial: Negative Visualized orbits: Negative Mastoids and visualized paranasal sinuses: Paranasal sinuses clear. Small osteoma left frontal sinus. Skeleton: Cervical spondylosis.  No acute skeletal abnormality. Upper chest: Negative Other: None IMPRESSION: No acute abnormality Exam limited by motion and lack of intravenous contrast. Electronically Signed   By: Franchot Gallo M.D.   On: 01/15/2019 16:34   CT CHEST WO CONTRAST  Result Date: 01/15/2019 CLINICAL DATA:  Abdominal pain and fever. Abscess suspected. COVID-19 pneumonia. EXAM: CT CHEST, ABDOMEN AND PELVIS WITHOUT CONTRAST TECHNIQUE: Multidetector CT imaging of the chest, abdomen and pelvis was performed following the standard protocol without IV contrast. COMPARISON:  One-view chest x-ray 01/15/2019 FINDINGS: CT CHEST FINDINGS Cardiovascular: The heart size is normal. Scratched at the heart size upper limits of normal. Atherosclerotic calcifications are noted at the aortic valve and aortic arch. There is no aneurysm. Pulmonary artery size is normal. Mediastinum/Nodes: No significant mediastinal hilar, or axillary adenopathy is  present. Esophagus is within normal limits. Thoracic inlet is normal. Lungs/Pleura: Bilateral lower lobe airspace consolidation is present. Additional  patchy airspace disease is present in the superior segment of both lower lobes and in the right middle lobe. Mild patchy airspace opacities are present right upper lobe as well. Musculoskeletal: Vertebral body heights are maintained. No focal lytic or blastic lesions are present. CT ABDOMEN PELVIS FINDINGS Hepatobiliary: There is diffuse fatty infiltration of the liver. No discrete lesions are present. The common bile duct and gallbladder are within normal limits. Pancreas: Mild fatty infiltration of the pancreas is present. No discrete lesions are present. Spleen: Normal in size without focal abnormality. Adrenals/Urinary Tract: The adrenal glands are normal bilaterally. Kidneys are unremarkable. There is no stone or mass lesion. No obstruction is present. The ureters are within normal limits. The urinary bladder is decompressed with a Foley catheter in place. Stomach/Bowel: The stomach and duodenum are within limits. Small bowel is unremarkable. Terminal ileum is normal. Appendix is visualized and within limits. The ascending and transverse colon are normal. Descending and sigmoid colon are normal. Vascular/Lymphatic: Atherosclerotic changes are present in the aorta and branch vessels. Reproductive: Prostate is unremarkable. Other: No abdominal wall hernia or abnormality. No abdominopelvic ascites. Focal fluid collection present to suggest abscess Musculoskeletal: Endplate changes are present at L5-S1. Vertebral body heights are maintained. There is some straightening of lumbar lordosis. Bony pelvis is within normal limits. Hips are located and within normal limits. IMPRESSION: 1. Bilateral lower lobe and right middle lobe airspace disease compatible with multifocal pneumonia. There is likely involvement right upper lobe as well. 2. Hepatic steatosis. 3. No other acute or focal abnormality to explain the patient's abdominal pain. 4. Degenerative changes in the lower lumbar spine. 5. Aortic Atherosclerosis  (ICD10-I70.0). Electronically Signed   By: San Morelle M.D.   On: 01/15/2019 16:34   CT LUMBAR SPINE WO CONTRAST  Result Date: 01/17/2019 CLINICAL DATA:  Right lumbosacral pain radiating to the leg. Low back pain with increased fracture risk EXAM: CT LUMBAR SPINE WITHOUT CONTRAST TECHNIQUE: Multidetector CT imaging of the lumbar spine was performed without intravenous contrast administration. Multiplanar CT image reconstructions were also generated. COMPARISON:  None. FINDINGS: Segmentation: 5 lumbar type vertebrae Alignment: Normal Vertebrae: No evidence of fracture, discitis, or aggressive bone lesion Paraspinal and other soft tissues: Bilateral pneumonia in this patient with known COVID-19. Disc levels: Disc narrowing and endplate degeneration at L4-5. Mild facet spurring at L4-5 and below IMPRESSION: 1. No acute finding in the lumbar spine. 2. L4-5 moderate disc degeneration. Electronically Signed   By: Monte Fantasia M.D.   On: 01/17/2019 09:40   CT PELVIS WO CONTRAST  Result Date: 01/17/2019 CLINICAL DATA:  Pelvic fracture. EXAM: CT PELVIS WITHOUT CONTRAST TECHNIQUE: Multidetector CT imaging of the pelvis was performed following the standard protocol without intravenous contrast. COMPARISON:  January 15, 2019. FINDINGS: Urinary Tract: Urinary bladder is decompressed secondary to Foley catheter. Bowel:  Unremarkable visualized pelvic bowel loops. Vascular/Lymphatic: Atherosclerosis of visualized abdominal aorta and iliac arteries is noted. No adenopathy is noted. Reproductive:  No mass or other significant abnormality Other:  None. Musculoskeletal: No suspicious bone lesions identified. IMPRESSION: No definite evidence of pelvic fracture or other significant bony abnormality. Aortic Atherosclerosis (ICD10-I70.0). Electronically Signed   By: Marijo Conception M.D.   On: 01/17/2019 09:45   CXR am  Result Date: 01/15/2019 CLINICAL DATA:  Acute shortness of breath. Follow-up COVID-19  pneumonia. EXAM: PORTABLE CHEST 1 VIEW 11:16 a.m.: COMPARISON:  Portable chest x-ray earlier same day at 2:55 a.m. FINDINGS: Cardiac silhouette enlarged for AP portable technique, unchanged. Progressive airspace consolidation involving the lower lobes and the RIGHT MIDDLE LOBE since the examination earlier this morning. Streaky opacities in the lingula. UPPER lobes remain clear otherwise. Normal pulmonary vascularity. IMPRESSION: 1. Progressive bilateral lower lobe pneumonia since the examination earlier this morning. 2. Streaky opacities in the lingula, likely atelectasis. 3. Stable cardiomegaly without pulmonary edema. Electronically Signed   By: Evangeline Dakin M.D.   On: 01/15/2019 11:26   DG Chest Port 1 View  Result Date: 01/15/2019 CLINICAL DATA:  Shortness of breath. EXAM: PORTABLE CHEST 1 VIEW COMPARISON:  None. FINDINGS: The heart is enlarged. Patchy right greater than left airspace opacities in the lung bases. No pulmonary edema. No definite pleural effusion. No pneumothorax. No acute osseous abnormalities are seen. IMPRESSION: 1. Right greater than left basilar airspace opacities, suspicious for pneumonia. 2. Mild cardiomegaly. Electronically Signed   By: Keith Rake M.D.   On: 01/15/2019 03:19   ECHOCARDIOGRAM COMPLETE  Result Date: 01/16/2019   ECHOCARDIOGRAM REPORT   Patient Name:   KIMMIE DOREN Date of Exam: 01/16/2019 Medical Rec #:  [id number]    Height: Accession #:    [id number]   Weight:       283.1 lb Date of Birth:  May 16, 1955   BSA:          2.55 m Patient Age:    64 years     BP:           155/98 mmHg Patient Gender: M            HR:           105 bpm. Exam Location:  Inpatient Procedure: 2D Echo Indications:    Chest Pain 786.50 / R07.9  History:        Patient has no prior history of Echocardiogram examinations.                 Risk Factors:Hypertension and Diabetes. COVID 19.  Sonographer:    Darlina Sicilian RDCS Referring Phys: Graylin Shiver [facility]  Sonographer Comments:  Echo performed with the patient sitting upright in his chair IMPRESSIONS  1. Left ventricular ejection fraction, by visual estimation, is 60 to 65%. The left ventricle has normal function. There is mildly increased left ventricular hypertrophy.  2. Left ventricular diastolic parameters are consistent with Grade I diastolic dysfunction (impaired relaxation).  3. The left ventricle has no regional wall motion abnormalities.  4. Global right ventricle has normal systolic function.The right ventricular size is normal.  5. Left atrial size was normal.  6. Right atrial size was normal.  7. Mild mitral annular calcification.  8. The mitral valve is normal in structure. No evidence of mitral valve regurgitation. No evidence of mitral stenosis.  9. The tricuspid valve is normal in structure. Tricuspid valve regurgitation is mild. 10. The aortic valve has an indeterminant number of cusps. Aortic valve regurgitation is not visualized. Mild to moderate aortic valve sclerosis/calcification without any evidence of aortic stenosis. 11. The pulmonic valve was not well visualized. Pulmonic valve regurgitation is not visualized. 12. Technically difficult; normal LV systolic function; grade 1 diastolic dysfunction; mild LVH with mild proximal septal thickening; calcified aortic valve (not well interrogated but no clear AS by doppler). FINDINGS  Left Ventricle: Left ventricular ejection fraction, by visual estimation, is 60 to 65%. The left ventricle has normal function. The left ventricle has no regional wall motion  abnormalities. There is mildly increased left ventricular hypertrophy. Left ventricular diastolic parameters are consistent with Grade I diastolic dysfunction (impaired relaxation). Normal left atrial pressure. Right Ventricle: The right ventricular size is normal.Global RV systolic function is has normal systolic function. Left Atrium: Left atrial size was normal in size. Right Atrium: Right atrial size was normal in size  Pericardium: There is no evidence of pericardial effusion. Mitral Valve: The mitral valve is normal in structure. Mild mitral annular calcification. No evidence of mitral valve regurgitation. No evidence of mitral valve stenosis by observation. Tricuspid Valve: The tricuspid valve is normal in structure. Tricuspid valve regurgitation is mild. Aortic Valve: The aortic valve has an indeterminant number of cusps. Aortic valve regurgitation is not visualized. Mild to moderate aortic valve sclerosis/calcification is present, without any evidence of aortic stenosis. Pulmonic Valve: The pulmonic valve was not well visualized. Pulmonic valve regurgitation is not visualized. Pulmonic regurgitation is not visualized. Aorta: The aortic root is normal in size and structure. Venous: The inferior vena cava was not well visualized.  Additional Comments: Technically difficult; normal LV systolic function; grade 1 diastolic dysfunction; mild LVH with mild proximal septal thickening; calcified aortic valve (not well interrogated but no clear AS by doppler).  LEFT VENTRICLE PLAX 2D LVIDd:         3.78 cm  Diastology LVIDs:         3.04 cm  LV e' lateral:   10.60 cm/s LV PW:         0.90 cm  LV E/e' lateral: 9.0 LV IVS:        1.60 cm  LV e' medial:    6.96 cm/s LVOT diam:     1.90 cm  LV E/e' medial:  13.7 LV SV:         25 ml LVOT Area:     2.84 cm  LEFT ATRIUM             Index LA diam:        3.50 cm 1.37 cm/m LA Vol (A2C):   64.9 ml 25.50 ml/m LA Vol (A4C):   37.7 ml 14.81 ml/m LA Biplane Vol: 54.7 ml 21.49 ml/m  AORTIC VALVE LVOT Vmax:   99.20 cm/s LVOT Vmean:  59.300 cm/s LVOT VTI:    0.156 m  AORTA Ao Root diam: 3.10 cm MITRAL VALVE                         TRICUSPID VALVE MV Area (PHT): 7.09 cm              TR Peak grad:   35.0 mmHg MV PHT:        31.03 msec            TR Vmax:        296.00 cm/s MV Decel Time: 107 msec MV E velocity: 95.50 cm/s  103 cm/s  SHUNTS MV A velocity: 120.00 cm/s 70.3 cm/s Systemic VTI:  0.16 m  MV E/A ratio:  0.80        1.5       Systemic Diam: 1.90 cm  Kirk Ruths MD Electronically signed by Kirk Ruths MD Signature Date/Time: 01/16/2019/1:35:47 PM    Final

## 2019-01-19 NOTE — Progress Notes (Signed)
Pharmacy Antibiotic Note  Austin Shannon is a 63 y.o. male admitted on 01/15/2019 with respiratory distress and SARS-CoV-2 infection.  Pharmacy has been consulted for Unasyn dosing for possible aspiration PNA.  Cr is stable, WBC elevated but on steroids, cultures unrevealing.  Plan: -Continue Unasyn 3g IV q6h for now  Height: 6\' 2"  (188 cm) Weight: 283 lb 1.1 oz (128.4 kg) IBW/kg (Calculated) : 82.2  Temp (24hrs), Avg:99.9 F (37.7 C), Min:98.9 F (37.2 C), Max:101 F (38.3 C)  Recent Labs  Lab 01/15/19 0257 01/15/19 1136 01/16/19 0445 01/17/19 0345 01/18/19 0357 01/19/19 0609  WBC 8.4 6.6 7.1 8.1 13.1* 13.9*  CREATININE 1.99* 2.31* 1.86* 1.43* 1.32*  --   LATICACIDVEN 6.0* 3.6* 1.7  --   --   --     Estimated Creatinine Clearance: 81.6 mL/min (A) (by C-G formula based on SCr of 1.32 mg/dL (H)).    No Known Allergies   Thank you for allowing pharmacy to be a part of this patient's care.   Arrie Senate, PharmD, BCPS Clinical Pharmacist Please check AMION for all [facility] Pharmacy numbers 01/19/2019

## 2019-01-20 LAB — CULTURE, BLOOD (ROUTINE X 2)
Culture: NO GROWTH
Culture: NO GROWTH

## 2019-01-20 LAB — BASIC METABOLIC PANEL
Anion gap: 10 (ref 5–15)
BUN: 27 mg/dL — ABNORMAL HIGH (ref 8–23)
CO2: 33 mmol/L — ABNORMAL HIGH (ref 22–32)
Calcium: 7.7 mg/dL — ABNORMAL LOW (ref 8.9–10.3)
Chloride: 94 mmol/L — ABNORMAL LOW (ref 98–111)
Creatinine, Ser: 1.27 mg/dL — ABNORMAL HIGH (ref 0.61–1.24)
GFR calc Af Amer: >60 mL/min (ref >60)
GFR calc non Af Amer: 60 mL/min — ABNORMAL LOW (ref >60)
Glucose, Bld: 159 mg/dL — ABNORMAL HIGH (ref 70–99)
Potassium: 4.1 mmol/L (ref 3.5–5.1)
Sodium: 137 mmol/L (ref 135–145)

## 2019-01-20 LAB — CBC
HCT: 38.7 % — ABNORMAL LOW (ref 39.0–52.0)
Hemoglobin: 12.2 g/dL — ABNORMAL LOW (ref 13.0–17.0)
MCH: 24.7 pg — ABNORMAL LOW (ref 26.0–34.0)
MCHC: 31.5 g/dL (ref 30.0–36.0)
MCV: 78.5 fL — ABNORMAL LOW (ref 80.0–100.0)
Platelets: 316 10*3/uL (ref 150–400)
RBC: 4.93 MIL/uL (ref 4.22–5.81)
RDW: 15 % (ref 11.5–15.5)
WBC: 13 10*3/uL — ABNORMAL HIGH (ref 4.0–10.5)
nRBC: 0 % (ref 0.0–0.2)

## 2019-01-20 LAB — GLUCOSE, CAPILLARY
Glucose-Capillary: 129 mg/dL — ABNORMAL HIGH (ref 70–99)
Glucose-Capillary: 137 mg/dL — ABNORMAL HIGH (ref 70–99)
Glucose-Capillary: 189 mg/dL — ABNORMAL HIGH (ref 70–99)
Glucose-Capillary: 216 mg/dL — ABNORMAL HIGH (ref 70–99)
Glucose-Capillary: 283 mg/dL — ABNORMAL HIGH (ref 70–99)
Glucose-Capillary: 441 mg/dL — ABNORMAL HIGH (ref 70–99)

## 2019-01-20 MED ORDER — FUROSEMIDE 10 MG/ML IJ SOLN
60.0000 mg | Freq: Once | INTRAMUSCULAR | Status: AC
  Administered 2019-01-20: 60 mg via INTRAVENOUS
  Filled 2019-01-20: qty 6

## 2019-01-20 MED ORDER — PANTOPRAZOLE SODIUM 40 MG PO TBEC
40.0000 mg | DELAYED_RELEASE_TABLET | Freq: Two times a day (BID) | ORAL | Status: DC
  Administered 2019-01-20 – 2019-01-28 (×16): 40 mg via ORAL
  Filled 2019-01-20 (×15): qty 1

## 2019-01-20 MED ORDER — DOXYCYCLINE HYCLATE 100 MG PO TABS
100.0000 mg | ORAL_TABLET | Freq: Two times a day (BID) | ORAL | Status: DC
  Administered 2019-01-20 – 2019-01-28 (×16): 100 mg via ORAL
  Filled 2019-01-20 (×18): qty 1

## 2019-01-20 MED ORDER — INSULIN ASPART 100 UNIT/ML ~~LOC~~ SOLN
30.0000 [IU] | Freq: Once | SUBCUTANEOUS | Status: AC
  Administered 2019-01-20: 18:00:00 30 [IU] via SUBCUTANEOUS

## 2019-01-20 MED ORDER — INSULIN STARTER KIT- SYRINGES (ENGLISH)
1.0000 | Freq: Once | Status: DC
  Filled 2019-01-20: qty 1

## 2019-01-20 NOTE — Progress Notes (Signed)
MEWS =6 PROTOCOL INITIATED. MADE MD AWARE, AS WELL AS RAPID RESPONSE AND CN. PT RESTING, DOES REPORT FEELING WEAKER THAN YESTERDAY. GAVE PRN TYLENOL FOR ELEVATED TEMP. ADMINISTERED SCHEDULED BETABLOCKER FOR HR. WILL CONT TO MONITOR

## 2019-01-20 NOTE — Progress Notes (Signed)
Inpatient Diabetes Program Recommendations  AACE/ADA: New Consensus Statement on Inpatient Glycemic Control (2015)  Target Ranges:  Prepandial:   less than 140 mg/dL      Peak postprandial:   less than 180 mg/dL (1-2 hours)      Critically ill patients:  140 - 180 mg/dL   Lab Results  Component Value Date   GLUCAP 216 (H) 01/20/2019   HGBA1C 8.2 (H) 01/15/2019    Review of Glycemic Control  LATE ENTRY - see addendum  Diabetes history: DM2 Outpatient Diabetes medications: Amaryl 2 mg tidwc, metformin 850 mg bid Current orders for Inpatient glycemic control: Lantus 20 units QD, Novolog 0-20 units Q4H + 3 units tidwc  HgbA1C - 8.2% Ordered insulin syringe starter kit and RN will be teaching insulin administration.  Pt received no Lantus yesterday and FBS was 137, 129 mg/dL this am. May only need Novolog s/s and meal coverage insulin.  Will speak with pt regarding going home on insulin, hypoglycemia s/s and treatment, importance of monitoring blood sugars and f/u with PCP.   Continue to follow.  Thank you. Lorenda Peck, RD, LDN, CDE Inpatient Diabetes Coordinator [phone]  Addendum: Blood sugars today 271, 239, 372. Needs insulin titration - Orders: Lantus 25 units bid, Novolog 0-20 units Q4H + 3 units tidwc  Recommendations:  Increase Novolog to 6 units tidwc for meal coverage insulin  RN to continue with diabetes education.  Thank you. Lorenda Peck, RD, LDN, CDE Inpatient Diabetes Coordinator [phone]

## 2019-01-20 NOTE — TOC Initial Note (Signed)
Transition of Care [facility]) - Initial/Assessment Note    Patient Details  Name: Ayad Nieman MRN: [id number] Date of Birth: [birth year]  [facility] CM/SW Contact:    Joaquin Courts, RN Phone Number: 01/20/2019, 1:12 PM  Clinical Narrative:                 CM received referral for pcp need. CM attempted to reach out to patient but was unable to get in touch on room telephone and no cell phone number listed on chart. CM called patient's spouse. Patient is from [facility] where he has a pcp. Spouse states she will make a follow up appointment for patient once they know his dc date.  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  The plan is for patient to return home to [place] at dc.  Spouse questioned if CM can arrange a ride from the hospital to [place]. CM explained that unfortunately due to patient's covid positive status, CM cannot arrange any public transportation rides (buses/planes).  Spouse shared that someone will be coming to [place] to pick up the patient's truck and CM questioned if that individual could also pick up the patient. Spouse added patient's brother to the call and they stated that spouse would need to come to [facility] and get patient at dc.  It is a 15 hour ride from [place] and spouse would need notice prior to dc date to ensure she can plan the trip to pick up patient. CM will continue to follow for discharge needs.    Expected Discharge Plan: Home/Self Care Barriers to Discharge: Continued Medical Work up   Patient Goals and CMS Choice Patient states their goals for this hospitalization and ongoing recovery are:: per the spouse to return home to [place]      Expected Discharge Plan and Services Expected Discharge Plan: Home/Self Care   Discharge Planning Services: CM Consult   Living arrangements for the past 2 months: Single Family Home                                      Prior Living Arrangements/Services Living arrangements  for the past 2 months: Single Family Home Lives with:: Spouse Patient language and need for interpreter reviewed:: Yes        Need for Family Participation in Patient Care: Yes (Comment) Care giver support system in place?: Yes (comment)   Criminal Activity/Legal Involvement Pertinent to Current Situation/Hospitalization: No - Comment as needed  Activities of Daily Living Home Assistive Devices/Equipment: None ADL Screening (condition at time of admission) Patient's cognitive ability adequate to safely complete daily activities?: Yes Is the patient deaf or have difficulty hearing?: No Does the patient have difficulty seeing, even when wearing glasses/contacts?: No Does the patient have difficulty concentrating, remembering, or making decisions?: No Patient able to express need for assistance with ADLs?: No Does the patient have difficulty dressing or bathing?: No Independently performs ADLs?: No(Pt has AMS upon arrival to the unit) Communication: Needs assistance Does the patient have difficulty walking or climbing stairs?: Yes Weakness of Legs: Both Weakness of Arms/Hands: None  Permission Sought/Granted                  Emotional Assessment           Psych Involvement: No (comment)  Admission diagnosis:  COVID-19 POSITIVE COUGH FEVER Patient Active Problem  List   Diagnosis Date Noted  . COVID-19 virus infection 01/15/2019   PCP:  Patient, No Pcp Per Pharmacy:   [facility] [address], [place] [address] [place] [address] Phone: [phone] Fax: [phone]     Social Determinants of Health (SDOH) Interventions    Readmission Risk Interventions No flowsheet data found.

## 2019-01-20 NOTE — Progress Notes (Signed)
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 PMH includes HTN, DM.   PT Comments    Pt slowly progressing with mobility; pt with very flat affect this session and limited participation. Able to ambulate short distance with RW and min guard; distance limited by fatigue. SpO2 down to low 80s on 15L HFNC requiring prolonged seated rest to recover. Hopeful pt will continue to progress in order to return home; will continue to follow for d/c needs.   Follow Up Recommendations  Home health PT;Supervision for mobility/OOB(pending progression)     Equipment Recommendations  Rolling walker with 5" wheels    Recommendations for Other Services       Precautions / Restrictions Precautions Precautions: Fall Restrictions Weight Bearing Restrictions: No    Mobility  Bed Mobility               General bed mobility comments: Received sitting in recliner  Transfers Overall transfer level: Needs assistance Equipment used: Rolling walker (2 wheeled) Transfers: Sit to/from Stand Sit to Stand: Min guard         General transfer comment: Heavy reliance on UE support to push into standing to RW, close min guard; poor eccentric control into sitting  Ambulation/Gait Ambulation/Gait assistance: Min guard Gait Distance (Feet): 20 Feet Assistive device: Rolling walker (2 wheeled) Gait Pattern/deviations: Step-through pattern;Decreased stride length;Trunk flexed   Gait velocity interpretation: <1.31 ft/sec, indicative of household ambulator General Gait Details: Slow, fatigued gait with RW and close min guard for balance; further distance limited by fatigue; SpO2 down to low 80s on 15L HFNC requiring prolonged  seated rest to recover   Stairs             Wheelchair Mobility    Modified Rankin (Stroke Patients Only)       Balance Overall balance assessment: Needs assistance Sitting-balance support: Single extremity supported;Bilateral upper extremity supported;Feet supported Sitting balance-Leahy Scale: Fair       Standing balance-Leahy Scale: Poor Standing balance comment: Heavy reliance on BUE support                            Cognition Arousal/Alertness: Awake/alert Behavior During Therapy: Flat affect Overall Cognitive Status: Within Functional Limits for tasks assessed                                 General Comments: Very flat affect throughout session; following commands and answering questions appropriately but difficult to engage, "whatever you want me to do"      Exercises      General Comments General comments (skin integrity, edema, etc.): Not receptive to request to perform IS      Pertinent Vitals/Pain Pain Assessment: Faces Faces Pain Scale: Hurts a little bit Pain Location: Generalized Pain Descriptors / Indicators: Discomfort Pain Intervention(s): Monitored during session    Home Living                      Prior Function            PT Goals (current goals can now be found in the care  plan section) Progress towards PT goals: Progressing toward goals    Frequency    Min 3X/week      PT Plan Current plan remains appropriate    Co-evaluation              AM-PAC PT "6 Clicks" Mobility   Outcome Measure  Help needed turning from your back to your side while in a flat bed without using bedrails?: A Little Help needed moving from lying on your back to sitting on the side of a flat bed without using bedrails?: A Little Help needed moving to and from a bed to a chair (including a wheelchair)?: A Little Help needed standing up from a chair using your arms (e.g., wheelchair or bedside chair)?: A  Little Help needed to walk in hospital room?: A Little Help needed climbing 3-5 steps with a railing? : A Lot 6 Click Score: 17    End of Session Equipment Utilized During Treatment: Oxygen Activity Tolerance: Patient limited by fatigue Patient left: in chair;with call bell/phone within reach Nurse Communication: Mobility status PT Visit Diagnosis: Other abnormalities of gait and mobility (R26.89)     Time: 3664-4034 PT Time Calculation (min) (ACUTE ONLY): 26 min  Charges:  $Therapeutic Exercise: 8-22 mins $Therapeutic Activity: 8-22 mins                    Mabeline Caras, PT, DPT Acute Rehabilitation Services  Pager [phone] Office [place] 01/20/2019, 4:59 PM

## 2019-01-20 NOTE — Plan of Care (Signed)
  Problem: Education: Goal: Knowledge of risk factors and measures for prevention of condition will improve Outcome: Progressing   Problem: Coping: Goal: Psychosocial and spiritual needs will be supported Outcome: Progressing   Problem: Respiratory: Goal: Will maintain a patent airway Outcome: Progressing Goal: Complications related to the disease process, condition or treatment will be avoided or minimized Outcome: Progressing   

## 2019-01-20 NOTE — Progress Notes (Signed)
Pt received a bath and linen change tonight. Tolerated it well.

## 2019-01-20 NOTE — Progress Notes (Signed)
PROGRESS NOTE                                                                                                                                                                                                             Patient Demographics:    Austin Shannon, is a 63 y.o. male, DOB - 12-29-1955, QDI:[id number]  Outpatient Primary MD for the patient is Patient, No Pcp Per    LOS - 5  Admit date - 01/15/2019    CC - AMS     Brief Narrative -  Austin Shannon  is a 63 y.o. 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   Subjective:   Patient in bed, appears comfortable, denies any headache, no fever, no chest pain or pressure, +ve shortness of breath , no abdominal pain. No focal weakness.   Assessment  & Plan :     1.  Sepsis, septic shock, COVID-19 pneumonia, with concurrent bacterial infection.    He had extremely high procalcitonin in the range of 100, elevated lactic acid, elevated creatinine and high CRP.  He also has history of treated TB in the past.  He was started on IV remdesivir, IV steroids, IV empiric antibiotics which are Unasyn and doxycycline combination.  Clinically he has improved, noncontrast CT neck, chest, abdomen and pelvis unremarkable. Cleared by speech.  Continue supportive care.  Still quite sick and tenuous but better than he was at the time of admission.    Continue to monitor blood cultures.  Respiratory cultures growing few  colonies of staph aureus and he is already on doxycycline.   SpO2: 95 % O2 Flow Rate (L/min): 5 L/min  Recent Labs  Lab 01/15/19 0257 01/15/19 1136 01/16/19 0445 01/17/19 0345 01/18/19 0357 01/19/19 0609 01/19/19 0610  CRP 24.2* 35.6* 43.8* 21.3* 11.9*  --  11.3*  DDIMER  --  4.41* 5.04* 3.33* 1.93* 1.77*  --   FERRITIN 402*  --   --   --   --   --   --   BNP 161.0* 83.4 61.0 82.0 134.1* 167.1*  --   PROCALCITON 74.80 111.36 96.21 53.75 27.77  --   --     Hepatic Function Latest Ref Rng & Units 01/19/2019 01/18/2019 01/17/2019  Total Protein 6.5 - 8.1 g/dL 6.3(L) 6.5 6.3(L)  Albumin 3.5 - 5.0 g/dL 2.7(L) 2.9(L) 2.8(L)  AST 15 - 41 U/L 37 48(H) 65(H)  ALT 0 - 44 U/L 36 41 40  Alk Phosphatase 38 - 126 U/L 68 55 49  Total Bilirubin 0.3 - 1.2 mg/dL 0.9 0.6 0.3    2.  Dehydration, rhabdomyolysis with AKI and hyperkalemia.  Continue Foley for now, CT scan nonacute, improving with hydration and Kayexalate, CK has improved.  IV fluids stopped.  3.  Transaminitis.  Due to COVID-19 along with rhabdomyolysis, symptom-free no abdominal pain.  CT abdomen pelvis nonacute.  Symptom-free and resolved.  4.  Dyslipidemia.  Home dose statin once able to swallow.  5.  DM type II.  Reduced Lantus, only sliding scale.  Poor outpatient glycemic control due to hyperglycemia.  Diabetic and insulin education ordered. Dose adjusted 12/14.  CBG (last 3)  Recent Labs    01/20/19 0815 01/20/19 1213 01/20/19 1725  GLUCAP 129* 216* 441*   Lab Results  Component Value Date   HGBA1C 8.2 (H) 01/15/2019    6.  Toxic encephalopathy.  Improving with supportive care no focal deficits.  7.  Borderline elevated troponin.  Not an ACS pattern, chest pain-free, EKG nonacute, aspirin, beta-blocker, stable echo.  8.  Hypertension.  Stable on Coreg and Norvasc.  9.  Dysphagia and odynophagia.  Soft tissue neck CT unremarkable, could not add contrast due to renal failure, supportive care with  antibiotics, throat discomfort has resolved.  Speech following and currently on regular diet per speech.  10.  Right-sided lumbosacral discomfort.  Most likely musculoskeletal due to his recent fall at home, CT L-spine and pelvis unremarkable and nonacute.  Supportive care with NSAID cream, much improved.  11.Mild acute on chronic diastolic CHF EF 79% on echocardiogram.  IV lasix 60 mg today and beta-blocker.   Condition - Extremely Guarded  Family Communication  :  Wife 12/9, 12/10, 12/12  Code Status :  DNR  Diet :   Diet Order            Diet Carb Modified Fluid consistency: Thin; Room service appropriate? Yes  Diet effective now               Disposition Plan  :   PCU  Consults  :     Procedures  :    CT soft tissue neck, chest abdomen pelvis.  All noncontrast due to AKI.  Nonacute except for bilateral pneumonia.  CT L-spine and pelvis.  Nonacute.  Echocardiogram    1. Left ventricular ejection fraction, by visual estimation, is 60 to 65%. The left ventricle has normal function. There is mildly increased left ventricular hypertrophy.  2. Left ventricular diastolic parameters are consistent with Grade I diastolic dysfunction (impaired relaxation).  3. The left ventricle has no regional wall motion abnormalities.  4. Global right ventricle has normal systolic function.The right ventricular size is normal.  5. Left atrial size was normal.  6. Right atrial size was normal.  7. Mild mitral annular calcification.  8. The mitral valve is normal  in structure. No evidence of mitral valve regurgitation. No evidence of mitral stenosis.  9. The tricuspid valve is normal in structure. Tricuspid valve regurgitation is mild. 10. The aortic valve has an indeterminant number of cusps. Aortic valve regurgitation is not visualized. Mild to moderate aortic valve sclerosis/calcification without any evidence of aortic stenosis. 11. The pulmonic valve was not well visualized. Pulmonic  valve regurgitation is not visualized. 12. Technically difficult; normal LV systolic function; grade 1 diastolic dysfunction; mild LVH with mild proximal septal thickening; calcified aortic valve (not well interrogated but no clear AS by doppler).   PUD Prophylaxis : PPI  DVT Prophylaxis  :    Switch to high-dose prophylactic Lovenox on 01/16/2019  Lab Results  Component Value Date   PLT 316 01/20/2019    Inpatient Medications  Scheduled Meds: . amLODipine  10 mg Oral Daily  . aspirin  81 mg Oral Daily  . carvedilol  6.25 mg Oral BID  . Chlorhexidine Gluconate Cloth  6 each Topical Daily  . diclofenac Sodium  2 g Topical QID  . docusate sodium  100 mg Oral BID  . enoxaparin (LOVENOX) injection  65 mg Subcutaneous Q24H  . insulin aspart  0-20 Units Subcutaneous Q4H  . insulin aspart  3 Units Subcutaneous TID WC  . insulin glargine  20 Units Subcutaneous Daily  . insulin starter kit- syringes  1 kit Other Once  . methylPREDNISolone (SOLU-MEDROL) injection  40 mg Intravenous Daily  . pantoprazole  40 mg Oral BID   Continuous Infusions: . ampicillin-sulbactam (UNASYN) IV 3 g (01/20/19 1734)   PRN Meds:.acetaminophen, acetaminophen, menthol-cetylpyridinium, metoprolol tartrate, [DISCONTINUED] ondansetron **OR** ondansetron (ZOFRAN) IV, traMADol  Antibiotics  :    Anti-infectives (From admission, onward)   Start     Dose/Rate Route Frequency Ordered Stop   01/16/19 1000  remdesivir 100 mg in sodium chloride 0.9 % 100 mL IVPB  Status:  Discontinued     100 mg 200 mL/hr over 30 Minutes Intravenous Daily 01/15/19 1104 01/15/19 1107   01/16/19 1000  remdesivir 100 mg in sodium chloride 0.9 % 100 mL IVPB     100 mg 200 mL/hr over 30 Minutes Intravenous Daily 01/15/19 1114 01/19/19 1100   01/15/19 1200  remdesivir 200 mg in sodium chloride 0.9% 250 mL IVPB  Status:  Discontinued     200 mg 580 mL/hr over 30 Minutes Intravenous Once 01/15/19 1104 01/15/19 1107   01/15/19 1200   Ampicillin-Sulbactam (UNASYN) 3 g in sodium chloride 0.9 % 100 mL IVPB     3 g 200 mL/hr over 30 Minutes Intravenous Every 6 hours 01/15/19 1107 01/22/19 1159       Time Spent in minutes  30   Lala Lund M.D on 01/20/2019 at 6:24 PM  To page go to [email or web address] - password [email or web address]  [facility] -  Office  [phone]  See all Orders from today for further details    Objective:   Vitals:   01/20/19 0732 01/20/19 1406 01/20/19 1801 01/20/19 1814  BP: (!) 163/88   (!) 160/97  Pulse: 95   (!) 104  Resp: 20   20  Temp: 100.3 F (37.9 C)   99.4 F (37.4 C)  TempSrc:    Oral  SpO2: 92% 90% 99% 95%  Weight:      Height:        Wt Readings from Last 3 Encounters:  01/15/19 128.4 kg  01/15/19 128.4 kg     Intake/Output Summary (Last 24 hours)  at 01/20/2019 1824 Last data filed at 01/20/2019 1500 Gross per 24 hour  Intake 870 ml  Output 3000 ml  Net -2130 ml     Physical Exam  Awake Alert,   No new F.N deficits, Normal affect [place].AT,PERRAL Supple Neck,No JVD, No cervical lymphadenopathy appriciated.  Symmetrical Chest wall movement, Good air movement bilaterally, +ve rales and coarse B sounds RRR,No Gallops, Rubs or new Murmurs, No Parasternal Heave +ve B.Sounds, Abd Soft, No tenderness, No organomegaly appriciated, No rebound - guarding or rigidity. No Cyanosis, Clubbing or edema, No new Rash or bruise     Data Review:    CBC Recent Labs  Lab 01/15/19 1136 01/16/19 0445 01/17/19 0345 01/18/19 0357 01/19/19 0609 01/20/19 1020  WBC 6.6 7.1 8.1 13.1* 13.9* 13.0*  HGB 15.0 13.8 12.0* 11.9* 12.4* 12.2*  HCT 49.6 45.4 39.1 39.8 40.4 38.7*  PLT 207 192 198 235 270 316  MCV 80.5 80.2 79.6* 80.1 79.5* 78.5*  MCH 24.4* 24.4* 24.4* 23.9* 24.4* 24.7*  MCHC 30.2 30.4 30.7 29.9* 30.7 31.5  RDW 15.1 15.1 15.2 15.4 15.2 15.0  LYMPHSABS 1.1 0.9 0.6* 0.8 1.3  --   MONOABS 0.4 0.5 0.4 0.8 1.2*  --   EOSABS 0.0 0.0 0.0 0.0 0.0  --   BASOSABS 0.1 0.1 0.0 0.0  0.0  --     Chemistries  Recent Labs  Lab 01/15/19 1136 01/16/19 0445 01/17/19 0345 01/18/19 0357 01/19/19 0609 01/20/19 1020  NA 135 140 142 141 141 137  K 5.9* 4.1 4.2 4.3 3.9 4.1  CL 97* 103 101 100 98 94*  CO2 26 28 28 29  33* 33*  GLUCOSE 296* 202* 213* 113* 51* 159*  BUN 34* 36* 35* 30* 26* 27*  CREATININE 2.31* 1.86* 1.43* 1.32* 1.40* 1.27*  CALCIUM 8.0* 8.1* 8.0* 7.7* 7.6* 7.7*  MG  --  2.2 2.6* 2.8* 2.5*  --   AST 111* 104* 65* 48* 37  --   ALT 55* 48* 40 41 36  --   ALKPHOS 50 44 49 55 68  --   BILITOT 0.5 0.6 0.3 0.6 0.9  --    ------------------------------------------------------------------------------------------------------------------ No results for input(s): CHOL, HDL, LDLCALC, TRIG, CHOLHDL, LDLDIRECT in the last 72 hours.  Lab Results  Component Value Date   HGBA1C 8.2 (H) 01/15/2019   ------------------------------------------------------------------------------------------------------------------ No results for input(s): TSH, T4TOTAL, T3FREE, THYROIDAB in the last 72 hours.  Invalid input(s): FREET3  Cardiac Enzymes No results for input(s): CKMB, TROPONINI, MYOGLOBIN in the last 168 hours.  Invalid input(s): CK ------------------------------------------------------------------------------------------------------------------    Component Value Date/Time   BNP 167.1 (H) 01/19/2019 8016    Micro Results  Recent Results (from the past 240 hour(s))  Culture, blood (routine x 2)     Status: None   Collection Time: 01/15/19  2:57 AM   Specimen: BLOOD  Result Value Ref Range Status   Specimen Description BLOOD RIGHT ANTECUBITAL  Final   Special Requests   Final    BOTTLES DRAWN AEROBIC AND ANAEROBIC Blood Culture results may not be optimal due to an excessive volume of blood received in culture bottles   Culture   Final    NO GROWTH 5 DAYS Performed at [facility], [address] [place], [place] [address]    Report Status  01/20/2019 FINAL  Final  Culture, blood (routine x 2)     Status: None   Collection Time: 01/15/19  2:57 AM   Specimen: BLOOD  Result Value Ref Range  Status   Specimen Description BLOOD BLOOD LEFT HAND  Final   Special Requests   Final    BOTTLES DRAWN AEROBIC AND ANAEROBIC Blood Culture results may not be optimal due to an inadequate volume of blood received in culture bottles   Culture   Final    NO GROWTH 5 DAYS Performed at [facility], [address], [place], [place] [address]    Report Status 01/20/2019 FINAL  Final  Urine culture     Status: None   Collection Time: 01/15/19  9:41 AM   Specimen: Urine, Random  Result Value Ref Range Status   Specimen Description   Final    URINE, RANDOM Performed at [facility], [address] [place], [place] [address]    Special Requests   Final    NONE Performed at [facility], [address], [place], [place] [address]    Culture   Final    NO GROWTH Performed at [facility], [place] [address] [place], [place] [address]    Report Status 01/16/2019 FINAL  Final  Culture, Urine     Status: None   Collection Time: 01/15/19  6:45 PM   Specimen: Urine, Catheterized  Result Value Ref Range Status   Specimen Description   Final    URINE, CATHETERIZED Performed at [place] [address], [place], [place] [address]    Special Requests   Final    NONE Performed at [facility], [place] [address], [place], [place] [address]    Culture   Final    NO GROWTH Performed at [facility], [place] [address], [place], [place] [address]    Report Status 01/16/2019 FINAL  Final  Culture, respiratory     Status: None   Collection Time: 01/15/19  6:45 PM   Specimen: Tracheal Aspirate  Result Value Ref Range Status   Specimen Description   Final    TRACHEAL ASPIRATE Performed at [place] [address], [place], [place] [address]     Special Requests   Final    NONE Performed at [facility], [place] [address] [place], [place] [address]    Gram Stain   Final    RARE WBC PRESENT, PREDOMINANTLY PMN ABUNDANT GRAM POSITIVE COCCI IN PAIRS IN CLUSTERS ABUNDANT GRAM NEGATIVE RODS FEW GRAM POSITIVE RODS Performed at [facility], [place] [address], [place], [place] [address]    Culture FEW STAPHYLOCOCCUS AUREUS  Final   Report Status 01/19/2019 FINAL  Final   Organism ID, Bacteria STAPHYLOCOCCUS AUREUS  Final      Susceptibility   Staphylococcus aureus - MIC*    CIPROFLOXACIN <=0.5 SENSITIVE Sensitive     ERYTHROMYCIN <=0.25 SENSITIVE Sensitive     GENTAMICIN <=0.5 SENSITIVE Sensitive     OXACILLIN 0.5 SENSITIVE Sensitive     TETRACYCLINE <=1 SENSITIVE Sensitive     VANCOMYCIN 1 SENSITIVE Sensitive     TRIMETH/SULFA <=10 SENSITIVE Sensitive     CLINDAMYCIN <=0.25 SENSITIVE Sensitive     RIFAMPIN <=0.5 SENSITIVE Sensitive     Inducible Clindamycin NEGATIVE Sensitive     * FEW STAPHYLOCOCCUS AUREUS    Radiology Reports CT ABDOMEN PELVIS WO CONTRAST  Result Date: 01/15/2019 CLINICAL DATA:  Abdominal pain and fever. Abscess suspected. COVID-19 pneumonia. EXAM: CT CHEST, ABDOMEN AND PELVIS WITHOUT CONTRAST TECHNIQUE: Multidetector CT imaging of the chest, abdomen and pelvis was performed following the standard protocol without IV  contrast. COMPARISON:  One-view chest x-ray 01/15/2019 FINDINGS: CT CHEST FINDINGS Cardiovascular: The heart size is normal. Scratched at the heart size upper limits of normal. Atherosclerotic calcifications are noted at the aortic valve and aortic arch. There is no aneurysm. Pulmonary artery size is normal. Mediastinum/Nodes: No significant mediastinal hilar, or axillary adenopathy is present. Esophagus is within normal limits. Thoracic inlet is normal. Lungs/Pleura: Bilateral lower lobe airspace consolidation is present. Additional patchy airspace disease is present in the  superior segment of both lower lobes and in the right middle lobe. Mild patchy airspace opacities are present right upper lobe as well. Musculoskeletal: Vertebral body heights are maintained. No focal lytic or blastic lesions are present. CT ABDOMEN PELVIS FINDINGS Hepatobiliary: There is diffuse fatty infiltration of the liver. No discrete lesions are present. The common bile duct and gallbladder are within normal limits. Pancreas: Mild fatty infiltration of the pancreas is present. No discrete lesions are present. Spleen: Normal in size without focal abnormality. Adrenals/Urinary Tract: The adrenal glands are normal bilaterally. Kidneys are unremarkable. There is no stone or mass lesion. No obstruction is present. The ureters are within normal limits. The urinary bladder is decompressed with a Foley catheter in place. Stomach/Bowel: The stomach and duodenum are within limits. Small bowel is unremarkable. Terminal ileum is normal. Appendix is visualized and within limits. The ascending and transverse colon are normal. Descending and sigmoid colon are normal. Vascular/Lymphatic: Atherosclerotic changes are present in the aorta and branch vessels. Reproductive: Prostate is unremarkable. Other: No abdominal wall hernia or abnormality. No abdominopelvic ascites. Focal fluid collection present to suggest abscess Musculoskeletal: Endplate changes are present at L5-S1. Vertebral body heights are maintained. There is some straightening of lumbar lordosis. Bony pelvis is within normal limits. Hips are located and within normal limits. IMPRESSION: 1. Bilateral lower lobe and right middle lobe airspace disease compatible with multifocal pneumonia. There is likely involvement right upper lobe as well. 2. Hepatic steatosis. 3. No other acute or focal abnormality to explain the patient's abdominal pain. 4. Degenerative changes in the lower lumbar spine. 5. Aortic Atherosclerosis (ICD10-I70.0). Electronically Signed   By:  San Morelle M.D.   On: 01/15/2019 16:34   CT SOFT TISSUE NECK WO CONTRAST  Result Date: 01/15/2019 CLINICAL DATA:  COVID-19 positive.  Sepsis.  Renal insufficiency EXAM: CT NECK WITHOUT CONTRAST TECHNIQUE: Multidetector CT imaging of the neck was performed following the standard protocol without intravenous contrast. COMPARISON:  None. FINDINGS: Pharynx and larynx: Image quality degraded by motion. Normal airway.  No mass or abscess in the larynx. Salivary glands: No inflammation, mass, or stone. Thyroid: Negative Lymph nodes: No enlarged lymph nodes in the neck. Vascular: Limited vascular evaluation without intravenous contrast. Limited intracranial: Negative Visualized orbits: Negative Mastoids and visualized paranasal sinuses: Paranasal sinuses clear. Small osteoma left frontal sinus. Skeleton: Cervical spondylosis.  No acute skeletal abnormality. Upper chest: Negative Other: None IMPRESSION: No acute abnormality Exam limited by motion and lack of intravenous contrast. Electronically Signed   By: Franchot Gallo M.D.   On: 01/15/2019 16:34   CT CHEST WO CONTRAST  Result Date: 01/15/2019 CLINICAL DATA:  Abdominal pain and fever. Abscess suspected. COVID-19 pneumonia. EXAM: CT CHEST, ABDOMEN AND PELVIS WITHOUT CONTRAST TECHNIQUE: Multidetector CT imaging of the chest, abdomen and pelvis was performed following the standard protocol without IV contrast. COMPARISON:  One-view chest x-ray 01/15/2019 FINDINGS: CT CHEST FINDINGS Cardiovascular: The heart size is normal. Scratched at the heart size upper limits of normal. Atherosclerotic calcifications are noted at  the aortic valve and aortic arch. There is no aneurysm. Pulmonary artery size is normal. Mediastinum/Nodes: No significant mediastinal hilar, or axillary adenopathy is present. Esophagus is within normal limits. Thoracic inlet is normal. Lungs/Pleura: Bilateral lower lobe airspace consolidation is present. Additional patchy airspace disease is  present in the superior segment of both lower lobes and in the right middle lobe. Mild patchy airspace opacities are present right upper lobe as well. Musculoskeletal: Vertebral body heights are maintained. No focal lytic or blastic lesions are present. CT ABDOMEN PELVIS FINDINGS Hepatobiliary: There is diffuse fatty infiltration of the liver. No discrete lesions are present. The common bile duct and gallbladder are within normal limits. Pancreas: Mild fatty infiltration of the pancreas is present. No discrete lesions are present. Spleen: Normal in size without focal abnormality. Adrenals/Urinary Tract: The adrenal glands are normal bilaterally. Kidneys are unremarkable. There is no stone or mass lesion. No obstruction is present. The ureters are within normal limits. The urinary bladder is decompressed with a Foley catheter in place. Stomach/Bowel: The stomach and duodenum are within limits. Small bowel is unremarkable. Terminal ileum is normal. Appendix is visualized and within limits. The ascending and transverse colon are normal. Descending and sigmoid colon are normal. Vascular/Lymphatic: Atherosclerotic changes are present in the aorta and branch vessels. Reproductive: Prostate is unremarkable. Other: No abdominal wall hernia or abnormality. No abdominopelvic ascites. Focal fluid collection present to suggest abscess Musculoskeletal: Endplate changes are present at L5-S1. Vertebral body heights are maintained. There is some straightening of lumbar lordosis. Bony pelvis is within normal limits. Hips are located and within normal limits. IMPRESSION: 1. Bilateral lower lobe and right middle lobe airspace disease compatible with multifocal pneumonia. There is likely involvement right upper lobe as well. 2. Hepatic steatosis. 3. No other acute or focal abnormality to explain the patient's abdominal pain. 4. Degenerative changes in the lower lumbar spine. 5. Aortic Atherosclerosis (ICD10-I70.0). Electronically  Signed   By: San Morelle M.D.   On: 01/15/2019 16:34   CT LUMBAR SPINE WO CONTRAST  Result Date: 01/17/2019 CLINICAL DATA:  Right lumbosacral pain radiating to the leg. Low back pain with increased fracture risk EXAM: CT LUMBAR SPINE WITHOUT CONTRAST TECHNIQUE: Multidetector CT imaging of the lumbar spine was performed without intravenous contrast administration. Multiplanar CT image reconstructions were also generated. COMPARISON:  None. FINDINGS: Segmentation: 5 lumbar type vertebrae Alignment: Normal Vertebrae: No evidence of fracture, discitis, or aggressive bone lesion Paraspinal and other soft tissues: Bilateral pneumonia in this patient with known COVID-19. Disc levels: Disc narrowing and endplate degeneration at L4-5. Mild facet spurring at L4-5 and below IMPRESSION: 1. No acute finding in the lumbar spine. 2. L4-5 moderate disc degeneration. Electronically Signed   By: Monte Fantasia M.D.   On: 01/17/2019 09:40   CT PELVIS WO CONTRAST  Result Date: 01/17/2019 CLINICAL DATA:  Pelvic fracture. EXAM: CT PELVIS WITHOUT CONTRAST TECHNIQUE: Multidetector CT imaging of the pelvis was performed following the standard protocol without intravenous contrast. COMPARISON:  January 15, 2019. FINDINGS: Urinary Tract: Urinary bladder is decompressed secondary to Foley catheter. Bowel:  Unremarkable visualized pelvic bowel loops. Vascular/Lymphatic: Atherosclerosis of visualized abdominal aorta and iliac arteries is noted. No adenopathy is noted. Reproductive:  No mass or other significant abnormality Other:  None. Musculoskeletal: No suspicious bone lesions identified. IMPRESSION: No definite evidence of pelvic fracture or other significant bony abnormality. Aortic Atherosclerosis (ICD10-I70.0). Electronically Signed   By: Marijo Conception M.D.   On: 01/17/2019 09:45   CXR am  Result Date: 01/15/2019 CLINICAL DATA:  Acute shortness of breath. Follow-up COVID-19 pneumonia. EXAM: PORTABLE CHEST 1 VIEW  11:16 a.m.: COMPARISON:  Portable chest x-ray earlier same day at 2:55 a.m. FINDINGS: Cardiac silhouette enlarged for AP portable technique, unchanged. Progressive airspace consolidation involving the lower lobes and the RIGHT MIDDLE LOBE since the examination earlier this morning. Streaky opacities in the lingula. UPPER lobes remain clear otherwise. Normal pulmonary vascularity. IMPRESSION: 1. Progressive bilateral lower lobe pneumonia since the examination earlier this morning. 2. Streaky opacities in the lingula, likely atelectasis. 3. Stable cardiomegaly without pulmonary edema. Electronically Signed   By: Evangeline Dakin M.D.   On: 01/15/2019 11:26   DG Chest Port 1 View  Result Date: 01/15/2019 CLINICAL DATA:  Shortness of breath. EXAM: PORTABLE CHEST 1 VIEW COMPARISON:  None. FINDINGS: The heart is enlarged. Patchy right greater than left airspace opacities in the lung bases. No pulmonary edema. No definite pleural effusion. No pneumothorax. No acute osseous abnormalities are seen. IMPRESSION: 1. Right greater than left basilar airspace opacities, suspicious for pneumonia. 2. Mild cardiomegaly. Electronically Signed   By: Keith Rake M.D.   On: 01/15/2019 03:19   ECHOCARDIOGRAM COMPLETE  Result Date: 01/16/2019   ECHOCARDIOGRAM REPORT   Patient Name:   MARTHA SOLTYS Date of Exam: 01/16/2019 Medical Rec #:  [id number]    Height: Accession #:    [id number]   Weight:       283.1 lb Date of Birth:  1955/12/13   BSA:          2.55 m Patient Age:    75 years     BP:           155/98 mmHg Patient Gender: M            HR:           105 bpm. Exam Location:  Inpatient Procedure: 2D Echo Indications:    Chest Pain 786.50 / R07.9  History:        Patient has no prior history of Echocardiogram examinations.                 Risk Factors:Hypertension and Diabetes. COVID 19.  Sonographer:    Darlina Sicilian RDCS Referring Phys: Graylin Shiver [facility]  Sonographer Comments: Echo performed with the patient sitting  upright in his chair IMPRESSIONS  1. Left ventricular ejection fraction, by visual estimation, is 60 to 65%. The left ventricle has normal function. There is mildly increased left ventricular hypertrophy.  2. Left ventricular diastolic parameters are consistent with Grade I diastolic dysfunction (impaired relaxation).  3. The left ventricle has no regional wall motion abnormalities.  4. Global right ventricle has normal systolic function.The right ventricular size is normal.  5. Left atrial size was normal.  6. Right atrial size was normal.  7. Mild mitral annular calcification.  8. The mitral valve is normal in structure. No evidence of mitral valve regurgitation. No evidence of mitral stenosis.  9. The tricuspid valve is normal in structure. Tricuspid valve regurgitation is mild. 10. The aortic valve has an indeterminant number of cusps. Aortic valve regurgitation is not visualized. Mild to moderate aortic valve sclerosis/calcification without any evidence of aortic stenosis. 11. The pulmonic valve was not well visualized. Pulmonic valve regurgitation is not visualized. 12. Technically difficult; normal LV systolic function; grade 1 diastolic dysfunction; mild LVH with mild proximal septal thickening; calcified aortic valve (not well interrogated but no clear AS by doppler). FINDINGS  Left Ventricle: Left ventricular  ejection fraction, by visual estimation, is 60 to 65%. The left ventricle has normal function. The left ventricle has no regional wall motion abnormalities. There is mildly increased left ventricular hypertrophy. Left ventricular diastolic parameters are consistent with Grade I diastolic dysfunction (impaired relaxation). Normal left atrial pressure. Right Ventricle: The right ventricular size is normal.Global RV systolic function is has normal systolic function. Left Atrium: Left atrial size was normal in size. Right Atrium: Right atrial size was normal in size Pericardium: There is no evidence of  pericardial effusion. Mitral Valve: The mitral valve is normal in structure. Mild mitral annular calcification. No evidence of mitral valve regurgitation. No evidence of mitral valve stenosis by observation. Tricuspid Valve: The tricuspid valve is normal in structure. Tricuspid valve regurgitation is mild. Aortic Valve: The aortic valve has an indeterminant number of cusps. Aortic valve regurgitation is not visualized. Mild to moderate aortic valve sclerosis/calcification is present, without any evidence of aortic stenosis. Pulmonic Valve: The pulmonic valve was not well visualized. Pulmonic valve regurgitation is not visualized. Pulmonic regurgitation is not visualized. Aorta: The aortic root is normal in size and structure. Venous: The inferior vena cava was not well visualized.  Additional Comments: Technically difficult; normal LV systolic function; grade 1 diastolic dysfunction; mild LVH with mild proximal septal thickening; calcified aortic valve (not well interrogated but no clear AS by doppler).  LEFT VENTRICLE PLAX 2D LVIDd:         3.78 cm  Diastology LVIDs:         3.04 cm  LV e' lateral:   10.60 cm/s LV PW:         0.90 cm  LV E/e' lateral: 9.0 LV IVS:        1.60 cm  LV e' medial:    6.96 cm/s LVOT diam:     1.90 cm  LV E/e' medial:  13.7 LV SV:         25 ml LVOT Area:     2.84 cm  LEFT ATRIUM             Index LA diam:        3.50 cm 1.37 cm/m LA Vol (A2C):   64.9 ml 25.50 ml/m LA Vol (A4C):   37.7 ml 14.81 ml/m LA Biplane Vol: 54.7 ml 21.49 ml/m  AORTIC VALVE LVOT Vmax:   99.20 cm/s LVOT Vmean:  59.300 cm/s LVOT VTI:    0.156 m  AORTA Ao Root diam: 3.10 cm MITRAL VALVE                         TRICUSPID VALVE MV Area (PHT): 7.09 cm              TR Peak grad:   35.0 mmHg MV PHT:        31.03 msec            TR Vmax:        296.00 cm/s MV Decel Time: 107 msec MV E velocity: 95.50 cm/s  103 cm/s  SHUNTS MV A velocity: 120.00 cm/s 70.3 cm/s Systemic VTI:  0.16 m MV E/A ratio:  0.80        1.5        Systemic Diam: 1.90 cm  Kirk Ruths MD Electronically signed by Kirk Ruths MD Signature Date/Time: 01/16/2019/1:35:47 PM    Final

## 2019-01-21 ENCOUNTER — Inpatient Hospital Stay (HOSPITAL_COMMUNITY): Payer: HRSA Program

## 2019-01-21 DIAGNOSIS — J9601 Acute respiratory failure with hypoxia: Secondary | ICD-10-CM

## 2019-01-21 DIAGNOSIS — R0602 Shortness of breath: Secondary | ICD-10-CM

## 2019-01-21 DIAGNOSIS — U071 COVID-19: Principal | ICD-10-CM

## 2019-01-21 LAB — CBC WITH DIFFERENTIAL/PLATELET
Abs Immature Granulocytes: 0.99 10*3/uL — ABNORMAL HIGH (ref 0.00–0.07)
Basophils Absolute: 0.1 10*3/uL (ref 0.0–0.1)
Basophils Relative: 1 %
Eosinophils Absolute: 0 10*3/uL (ref 0.0–0.5)
Eosinophils Relative: 0 %
HCT: 45.8 % (ref 39.0–52.0)
Hemoglobin: 13.7 g/dL (ref 13.0–17.0)
Immature Granulocytes: 7 %
Lymphocytes Relative: 12 %
Lymphs Abs: 1.8 10*3/uL (ref 0.7–4.0)
MCH: 23.9 pg — ABNORMAL LOW (ref 26.0–34.0)
MCHC: 29.9 g/dL — ABNORMAL LOW (ref 30.0–36.0)
MCV: 79.8 fL — ABNORMAL LOW (ref 80.0–100.0)
Monocytes Absolute: 1.3 10*3/uL — ABNORMAL HIGH (ref 0.1–1.0)
Monocytes Relative: 9 %
Neutro Abs: 10.9 10*3/uL — ABNORMAL HIGH (ref 1.7–7.7)
Neutrophils Relative %: 71 %
Platelets: 412 10*3/uL — ABNORMAL HIGH (ref 150–400)
RBC: 5.74 MIL/uL (ref 4.22–5.81)
RDW: 15.1 % (ref 11.5–15.5)
WBC: 15.2 10*3/uL — ABNORMAL HIGH (ref 4.0–10.5)
nRBC: 0.1 % (ref 0.0–0.2)

## 2019-01-21 LAB — COMPREHENSIVE METABOLIC PANEL
ALT: 41 U/L (ref 0–44)
AST: 30 U/L (ref 15–41)
Albumin: 3 g/dL — ABNORMAL LOW (ref 3.5–5.0)
Alkaline Phosphatase: 69 U/L (ref 38–126)
Anion gap: 12 (ref 5–15)
BUN: 28 mg/dL — ABNORMAL HIGH (ref 8–23)
CO2: 34 mmol/L — ABNORMAL HIGH (ref 22–32)
Calcium: 8.3 mg/dL — ABNORMAL LOW (ref 8.9–10.3)
Chloride: 94 mmol/L — ABNORMAL LOW (ref 98–111)
Creatinine, Ser: 1.5 mg/dL — ABNORMAL HIGH (ref 0.61–1.24)
GFR calc Af Amer: 57 mL/min — ABNORMAL LOW (ref >60)
GFR calc non Af Amer: 49 mL/min — ABNORMAL LOW (ref >60)
Glucose, Bld: 74 mg/dL (ref 70–99)
Potassium: 4.1 mmol/L (ref 3.5–5.1)
Sodium: 140 mmol/L (ref 135–145)
Total Bilirubin: 0.5 mg/dL (ref 0.3–1.2)
Total Protein: 7.6 g/dL (ref 6.5–8.1)

## 2019-01-21 LAB — PROCALCITONIN: Procalcitonin: 4 ng/mL

## 2019-01-21 LAB — GLUCOSE, CAPILLARY
Glucose-Capillary: 132 mg/dL — ABNORMAL HIGH (ref 70–99)
Glucose-Capillary: 177 mg/dL — ABNORMAL HIGH (ref 70–99)
Glucose-Capillary: 368 mg/dL — ABNORMAL HIGH (ref 70–99)
Glucose-Capillary: 463 mg/dL — ABNORMAL HIGH (ref 70–99)

## 2019-01-21 LAB — BRAIN NATRIURETIC PEPTIDE: B Natriuretic Peptide: 39.6 pg/mL (ref 0.0–100.0)

## 2019-01-21 LAB — D-DIMER, QUANTITATIVE: D-Dimer, Quant: 1.42 ug/mL-FEU — ABNORMAL HIGH (ref 0.00–0.50)

## 2019-01-21 LAB — C-REACTIVE PROTEIN: CRP: 19.9 mg/dL — ABNORMAL HIGH (ref <1.0)

## 2019-01-21 LAB — MAGNESIUM: Magnesium: 2.4 mg/dL (ref 1.7–2.4)

## 2019-01-21 MED ORDER — ASPIRIN EC 325 MG PO TBEC
325.0000 mg | DELAYED_RELEASE_TABLET | Freq: Every day | ORAL | Status: DC
  Administered 2019-01-21 – 2019-01-28 (×8): 325 mg via ORAL
  Filled 2019-01-21 (×8): qty 1

## 2019-01-21 MED ORDER — ATORVASTATIN CALCIUM 40 MG PO TABS
80.0000 mg | ORAL_TABLET | Freq: Every day | ORAL | Status: DC
  Administered 2019-01-21 – 2019-01-27 (×7): 80 mg via ORAL
  Filled 2019-01-21 (×7): qty 2

## 2019-01-21 MED ORDER — INSULIN ASPART 100 UNIT/ML ~~LOC~~ SOLN
30.0000 [IU] | Freq: Once | SUBCUTANEOUS | Status: AC
  Administered 2019-01-21: 30 [IU] via SUBCUTANEOUS

## 2019-01-21 MED ORDER — SODIUM CHLORIDE 0.9 % IV SOLN
3.0000 g | Freq: Four times a day (QID) | INTRAVENOUS | Status: AC
  Administered 2019-01-21 – 2019-01-24 (×13): 3 g via INTRAVENOUS
  Filled 2019-01-21: qty 8
  Filled 2019-01-21 (×2): qty 3
  Filled 2019-01-21 (×2): qty 8
  Filled 2019-01-21: qty 3
  Filled 2019-01-21 (×2): qty 8
  Filled 2019-01-21 (×6): qty 3

## 2019-01-21 MED ORDER — FUROSEMIDE 10 MG/ML IJ SOLN
40.0000 mg | Freq: Once | INTRAMUSCULAR | Status: AC
  Administered 2019-01-21: 40 mg via INTRAVENOUS
  Filled 2019-01-21: qty 4

## 2019-01-21 MED ORDER — INSULIN GLARGINE 100 UNIT/ML ~~LOC~~ SOLN: 30.0000 [IU] | Freq: Two times a day (BID) | SUBCUTANEOUS | Status: DC

## 2019-01-21 MED ORDER — INSULIN GLARGINE 100 UNIT/ML ~~LOC~~ SOLN
25.0000 [IU] | Freq: Two times a day (BID) | SUBCUTANEOUS | Status: DC
  Administered 2019-01-22 – 2019-01-24 (×6): 25 [IU] via SUBCUTANEOUS
  Filled 2019-01-21 (×8): qty 0.25

## 2019-01-21 MED ORDER — METHYLPREDNISOLONE SODIUM SUCC 40 MG IJ SOLR
40.0000 mg | Freq: Two times a day (BID) | INTRAMUSCULAR | Status: DC
  Administered 2019-01-21 – 2019-01-27 (×12): 40 mg via INTRAVENOUS
  Filled 2019-01-21 (×13): qty 1

## 2019-01-21 NOTE — Plan of Care (Signed)
  Problem: Education: Goal: Knowledge of risk factors and measures for prevention of condition will improve Outcome: Progressing   Problem: Coping: Goal: Psychosocial and spiritual needs will be supported Outcome: Progressing   Problem: Respiratory: Goal: Will maintain a patent airway Outcome: Progressing Goal: Complications related to the disease process, condition or treatment will be avoided or minimized Outcome: Progressing   

## 2019-01-21 NOTE — Progress Notes (Signed)
Pt received bath, full linen change, and oral care this shift.

## 2019-01-21 NOTE — Progress Notes (Addendum)
PROGRESS NOTE                                                                                                                                                                                                             Patient Demographics:    Austin Shannon, is a 63 y.o. male, DOB - [birth year], UDJ:[id number]  Outpatient Primary MD for the patient is Patient, No Pcp Per    LOS - 6  Admit date - 01/15/2019    CC - AMS     Brief Narrative -  Austin Shannon  is a 63 y.o. 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   Subjective:   Patient in bed, he appears to be in no distress but is warm to touch and running a fever, denies any headache chest or abdominal pain, does have low back pain and some discomfort in the right le.   Assessment  & Plan :     1.  Sepsis, septic shock, COVID-19 pneumonia, with concurrent bacterial infection.    He had extremely high procalcitonin in the range of 100, elevated lactic acid, elevated creatinine and high CRP.  He also has history of treated TB in the past.  He was started on IV remdesivir, IV steroids, IV empiric antibiotics which are Unasyn and doxycycline combination.  Clinically he has improved, noncontrast CT neck, chest, abdomen and pelvis unremarkable.   He has been cleared by speech but I still suspect he is aspirating, he is finishing Unasyn day 7 today but will extend as he  is febrile, continue on doxycycline,.    Continue to monitor blood cultures.  Respiratory cultures growing few colonies of staph aureus and he is already on doxycycline.   SpO2: 91 % O2 Flow Rate (L/min): 10 L/min  Recent Labs  Lab 01/15/19 0257  01/15/19 0257 01/15/19 1136 01/16/19 0445 01/17/19 0345 01/18/19 0357 01/19/19 0609 01/19/19 0610 01/21/19 0440  CRP 24.2*  --  35.6* 43.8* 21.3* 11.9*  --  11.3* 19.9*  DDIMER  --    < > 4.41* 5.04* 3.33* 1.93* 1.77*  --  1.42*  FERRITIN 402*  --   --   --   --   --   --   --   --   BNP 161.0*  --  83.4 61.0 82.0 134.1* 167.1*  --  39.6  PROCALCITON 74.80  --  111.36 96.21 53.75 27.77  --   --  4.00   < > = values in this interval not displayed.    Hepatic Function Latest Ref Rng & Units 01/21/2019 01/19/2019 01/18/2019  Total Protein 6.5 - 8.1 g/dL 7.6 6.3(L) 6.5  Albumin 3.5 - 5.0 g/dL 3.0(L) 2.7(L) 2.9(L)  AST 15 - 41 U/L 30 37 48(H)  ALT 0 - 44 U/L 41 36 41  Alk Phosphatase 38 - 126 U/L 69 68 55  Total Bilirubin 0.3 - 1.2 mg/dL 0.5 0.9 0.6    2.  Dehydration, rhabdomyolysis with AKI and hyperkalemia.  Continue Foley for now, CT scan nonacute, improving with hydration and Kayexalate, CK has improved.  IV fluids stopped.  No baseline creatinine in system patient is from [place].  3.  Transaminitis.  Due to COVID-19 along with rhabdomyolysis, completely resolved.  4.  Dyslipidemia.    Resumed statin.  5.  DM type II.  Reduced Lantus, only sliding scale.  Poor outpatient glycemic control due to hyperglycemia.  Diabetic and insulin education ordered. Dose adjusted 12/14.  CBG (last 3)  Recent Labs    01/20/19 2105 01/21/19 0759 01/21/19 1203  GLUCAP 283* 132* 177*   Lab Results  Component Value Date   HGBA1C 8.2 (H) 01/15/2019    6.  Toxic encephalopathy.  Improving with supportive care no focal deficits.  7.  Borderline elevated troponin.  Not an ACS pattern, chest pain-free, EKG nonacute, aspirin, beta-blocker,  stable echo.  8.  Hypertension.  Stable on Coreg and Norvasc.  9.  Dysphagia and odynophagia.  Soft tissue neck CT unremarkable, could not add contrast due to renal failure, supportive care with antibiotics, throat discomfort has resolved.  Speech following and currently on regular diet per speech.  10.  Right-sided lumbosacral discomfort.  Most likely musculoskeletal due to his recent fall at home, CT L-spine and pelvis unremarkable and nonacute.  Supportive care with NSAID cream, much improved.  11.  Intermittent right leg weakness, minimal right arm weakness.  Initially thought to be due to lumbosacral discomfort, worse on 01/21/2019, head CT unremarkable, currently on aspirin and statin.  Neurology has been consulted.  He may require MRI of the brain along with MRI of the spine if neuro think so.  12.  Fever again on 01/21/2019.  He was initially febrile when he came in, this resolved after he was started on Covid treatment along with antibiotics for aspiration, he still has coarse breath sounds and I think he is intermittently aspirating.  Will continue Unasyn and continue doxycycline.  Repeat blood cultures, repeat speech evaluation, he also has vague low back pain if all negative we might have to image that area with contrast, having said that procalcitonin is serially coming down in a substantial manner and blood cultures from admission remain negative.  Neuro also consulted.  Note sputum cultures grew few staph aureus for which she is on doxycycline.  Procalcitonin  trending down.  Will monitor.  If pulmonary wise he gets worse we will transition him to Vancomycin.  12. Mild acute on chronic diastolic CHF EF 42% on echocardiogram.  40 mg of Lasix today and beta-blocker.   Condition - Extremely Guarded  Family Communication  :  Wife 12/9, 12/10, 12/12  Code Status :  DNR  Diet :   Diet Order            Diet Carb Modified Fluid consistency: Thin; Room service appropriate? Yes  Diet  effective now               Disposition Plan  :   PCU  Consults  :     Procedures  :    CT soft tissue neck, chest abdomen pelvis.  All noncontrast due to AKI.  Nonacute except for bilateral pneumonia.  CT L-spine and pelvis.  Nonacute.  Echocardiogram    1. Left ventricular ejection fraction, by visual estimation, is 60 to 65%. The left ventricle has normal function. There is mildly increased left ventricular hypertrophy.  2. Left ventricular diastolic parameters are consistent with Grade I diastolic dysfunction (impaired relaxation).  3. The left ventricle has no regional wall motion abnormalities.  4. Global right ventricle has normal systolic function.The right ventricular size is normal.  5. Left atrial size was normal.  6. Right atrial size was normal.  7. Mild mitral annular calcification.  8. The mitral valve is normal in structure. No evidence of mitral valve regurgitation. No evidence of mitral stenosis.  9. The tricuspid valve is normal in structure. Tricuspid valve regurgitation is mild. 10. The aortic valve has an indeterminant number of cusps. Aortic valve regurgitation is not visualized. Mild to moderate aortic valve sclerosis/calcification without any evidence of aortic stenosis. 11. The pulmonic valve was not well visualized. Pulmonic valve regurgitation is not visualized. 12. Technically difficult; normal LV systolic function; grade 1 diastolic dysfunction; mild LVH with mild proximal septal thickening; calcified aortic valve (not well interrogated but no clear AS by doppler).   PUD Prophylaxis : PPI  DVT Prophylaxis  :    Switch to high-dose prophylactic Lovenox on 01/16/2019  Lab Results  Component Value Date   PLT 412 (H) 01/21/2019    Inpatient Medications  Scheduled Meds: . amLODipine  10 mg Oral Daily  . aspirin EC  325 mg Oral Daily  . atorvastatin  80 mg Oral q1800  . carvedilol  6.25 mg Oral BID  . Chlorhexidine Gluconate Cloth  6 each  Topical Daily  . diclofenac Sodium  2 g Topical QID  . docusate sodium  100 mg Oral BID  . doxycycline  100 mg Oral Q12H  . enoxaparin (LOVENOX) injection  65 mg Subcutaneous Q24H  . insulin aspart  0-20 Units Subcutaneous Q4H  . insulin aspart  3 Units Subcutaneous TID WC  . insulin glargine  20 Units Subcutaneous Daily  . insulin starter kit- syringes  1 kit Other Once  . methylPREDNISolone (SOLU-MEDROL) injection  40 mg Intravenous Daily  . pantoprazole  40 mg Oral BID   Continuous Infusions: . ampicillin-sulbactam (UNASYN) IV 3 g (01/21/19 1119)   PRN Meds:.acetaminophen, acetaminophen, menthol-cetylpyridinium, metoprolol tartrate, [DISCONTINUED] ondansetron **OR** ondansetron (ZOFRAN) IV, traMADol  Antibiotics  :    Anti-infectives (From admission, onward)   Start     Dose/Rate Route Frequency Ordered Stop   01/20/19 2200  doxycycline (VIBRA-TABS) tablet 100 mg     100 mg Oral Every 12 hours 01/20/19 1827  01/16/19 1000  remdesivir 100 mg in sodium chloride 0.9 % 100 mL IVPB  Status:  Discontinued     100 mg 200 mL/hr over 30 Minutes Intravenous Daily 01/15/19 1104 01/15/19 1107   01/16/19 1000  remdesivir 100 mg in sodium chloride 0.9 % 100 mL IVPB     100 mg 200 mL/hr over 30 Minutes Intravenous Daily 01/15/19 1114 01/19/19 1100   01/15/19 1200  remdesivir 200 mg in sodium chloride 0.9% 250 mL IVPB  Status:  Discontinued     200 mg 580 mL/hr over 30 Minutes Intravenous Once 01/15/19 1104 01/15/19 1107   01/15/19 1200  Ampicillin-Sulbactam (UNASYN) 3 g in sodium chloride 0.9 % 100 mL IVPB     3 g 200 mL/hr over 30 Minutes Intravenous Every 6 hours 01/15/19 1107 01/22/19 1159       Time Spent in minutes  30   Lala Lund M.D on 01/21/2019 at 1:07 PM  To page go to [email or web address] - password [facility]  [facility] -  Office  [phone]  See all Orders from today for further details    Objective:   Vitals:   01/21/19 0801 01/21/19 0824 01/21/19 1000  01/21/19 1110  BP: (!) 154/90 132/71 (!) 149/83 132/71  Pulse:  (!) 107 (!) 107 (!) 107  Resp: _0 Temp: (!) 102.1 F (38.9 C)  (!) 102.3 F (39.1 C) (!) 100.9 F (38.3 C)  TempSrc: Oral  Oral Oral  SpO2: 100%  98% 91%  Weight:      Height:        Wt Readings from Last 3 Encounters:  01/15/19 128.4 kg  01/15/19 128.4 kg     Intake/Output Summary (Last 24 hours) at 01/21/2019 1307 Last data filed at 01/21/2019 1119 Gross per 24 hour  Intake 300 ml  Output 1900 ml  Net -1600 ml     Physical Exam  Awake, mildly confused, moves left leg much less than his right,   .AT,PERRAL Supple Neck,No JVD, No cervical lymphadenopathy appriciated.  Symmetrical Chest wall movement, Good air movement bilaterally, coarse bilateral breath sounds RRR,No Gallops, Rubs or new Murmurs, No Parasternal Heave +ve B.Sounds, Abd Soft, No tenderness, No organomegaly appriciated, No rebound - guarding or rigidity. No Cyanosis, Clubbing or edema, No new Rash or bruise    Data Review:    CBC Recent Labs  Lab 01/16/19 0445 01/17/19 0345 01/18/19 0357 01/19/19 0609 01/20/19 1020 01/21/19 0440  WBC 7.1 8.1 13.1* 13.9* 13.0* 15.2*  HGB 13.8 12.0* 11.9* 12.4* 12.2* 13.7  HCT 45.4 39.1 39.8 40.4 38.7* 45.8  PLT 192 198 235 270 316 412*  MCV 80.2 79.6* 80.1 79.5* 78.5* 79.8*  MCH 24.4* 24.4* 23.9* 24.4* 24.7* 23.9*  MCHC 30.4 30.7 29.9* 30.7 31.5 29.9*  RDW 15.1 15.2 15.4 15.2 15.0 15.1  LYMPHSABS 0.9 0.6* 0.8 1.3  --  1.8  MONOABS 0.5 0.4 0.8 1.2*  --  1.3*  EOSABS 0.0 0.0 0.0 0.0  --  0.0  BASOSABS 0.1 0.0 0.0 0.0  --  0.1    Chemistries  Recent Labs  Lab 01/16/19 0445 01/17/19 0345 01/18/19 0357 01/19/19 0609 01/20/19 1020 01/21/19 0440  NA 140 142 141 141 137 140  K 4.1 4.2 4.3 3.9 4.1 4.1  CL 103 101 100 98 94* 94*  CO2 _1 33* 33* 34*  GLUCOSE 202* 213* 113* 51* 159* 74  BUN 36* 35* 30* 26* 27* 28*  CREATININE 1.86* 1.43* 1.32*  1.40* 1.27* 1.50*  CALCIUM  8.1* 8.0* 7.7* 7.6* 7.7* 8.3*  MG 2.2 2.6* 2.8* 2.5*  --  2.4  AST 104* 65* 48* 37  --  30  ALT 48* 40 41 36  --  41  ALKPHOS 44 49 55 68  --  69  BILITOT 0.6 0.3 0.6 0.9  --  0.5   ------------------------------------------------------------------------------------------------------------------ No results for input(s): CHOL, HDL, LDLCALC, TRIG, CHOLHDL, LDLDIRECT in the last 72 hours.  Lab Results  Component Value Date   HGBA1C 8.2 (H) 01/15/2019   ------------------------------------------------------------------------------------------------------------------ No results for input(s): TSH, T4TOTAL, T3FREE, THYROIDAB in the last 72 hours.  Invalid input(s): FREET3  Cardiac Enzymes No results for input(s): CKMB, TROPONINI, MYOGLOBIN in the last 168 hours.  Invalid input(s): CK ------------------------------------------------------------------------------------------------------------------    Component Value Date/Time   BNP 39.6 01/21/2019 0440    Micro Results  Recent Results (from the past 240 hour(s))  Culture, blood (routine x 2)     Status: None   Collection Time: 01/15/19  2:57 AM   Specimen: BLOOD  Result Value Ref Range Status   Specimen Description BLOOD RIGHT ANTECUBITAL  Final   Special Requests   Final    BOTTLES DRAWN AEROBIC AND ANAEROBIC Blood Culture results may not be optimal due to an excessive volume of blood received in culture bottles   Culture   Final    NO GROWTH 5 DAYS Performed at [facility], [address] [place], [place] [address]    Report Status 01/20/2019 FINAL  Final  Culture, blood (routine x 2)     Status: None   Collection Time: 01/15/19  2:57 AM   Specimen: BLOOD  Result Value Ref Range Status   Specimen Description BLOOD BLOOD LEFT HAND  Final   Special Requests   Final    BOTTLES DRAWN AEROBIC AND ANAEROBIC Blood Culture results may not be optimal due to an inadequate volume of blood received in culture bottles    Culture   Final    NO GROWTH 5 DAYS Performed at [facility], [address] [place], [place] [address]    Report Status 01/20/2019 FINAL  Final  Urine culture     Status: None   Collection Time: 01/15/19  9:41 AM   Specimen: Urine, Random  Result Value Ref Range Status   Specimen Description   Final    URINE, RANDOM Performed at [facility], [address] [place], [place] [address]    Special Requests   Final    NONE Performed at [facility], [address], [place], [place] [address]    Culture   Final    NO GROWTH Performed at [facility], [place] [address] [place], [place] [address]    Report Status 01/16/2019 FINAL  Final  Culture, Urine     Status: None   Collection Time: 01/15/19  6:45 PM   Specimen: Urine, Catheterized  Result Value Ref Range Status   Specimen Description   Final    URINE, CATHETERIZED Performed at [place] [address], [place], [place] [address]    Special Requests   Final    NONE Performed at [facility], [place] [address] [place], [place] [address]    Culture   Final    NO GROWTH Performed at [facility], [place] [address] [place], [place] [address]    Report Status 01/16/2019 FINAL  Final  Culture, respiratory  Status: None   Collection Time: 01/15/19  6:45 PM   Specimen: Tracheal Aspirate  Result Value Ref Range Status   Specimen Description   Final    TRACHEAL ASPIRATE Performed at [place] [address] [place], [place] [address]    Special Requests   Final    NONE Performed at [facility], [place] [address] [place], [place] [address]    Gram Stain   Final    RARE WBC PRESENT, PREDOMINANTLY PMN ABUNDANT GRAM POSITIVE COCCI IN PAIRS IN CLUSTERS ABUNDANT GRAM NEGATIVE RODS FEW GRAM POSITIVE RODS Performed at [facility], [place] [address], [place], [place] [address]    Culture FEW  STAPHYLOCOCCUS AUREUS  Final   Report Status 01/19/2019 FINAL  Final   Organism ID, Bacteria STAPHYLOCOCCUS AUREUS  Final      Susceptibility   Staphylococcus aureus - MIC*    CIPROFLOXACIN <=0.5 SENSITIVE Sensitive     ERYTHROMYCIN <=0.25 SENSITIVE Sensitive     GENTAMICIN <=0.5 SENSITIVE Sensitive     OXACILLIN 0.5 SENSITIVE Sensitive     TETRACYCLINE <=1 SENSITIVE Sensitive     VANCOMYCIN 1 SENSITIVE Sensitive     TRIMETH/SULFA <=10 SENSITIVE Sensitive     CLINDAMYCIN <=0.25 SENSITIVE Sensitive     RIFAMPIN <=0.5 SENSITIVE Sensitive     Inducible Clindamycin NEGATIVE Sensitive     * FEW STAPHYLOCOCCUS AUREUS    Radiology Reports CT ABDOMEN PELVIS WO CONTRAST  Result Date: 01/15/2019 CLINICAL DATA:  Abdominal pain and fever. Abscess suspected. COVID-19 pneumonia. EXAM: CT CHEST, ABDOMEN AND PELVIS WITHOUT CONTRAST TECHNIQUE: Multidetector CT imaging of the chest, abdomen and pelvis was performed following the standard protocol without IV contrast. COMPARISON:  One-view chest x-ray 01/15/2019 FINDINGS: CT CHEST FINDINGS Cardiovascular: The heart size is normal. Scratched at the heart size upper limits of normal. Atherosclerotic calcifications are noted at the aortic valve and aortic arch. There is no aneurysm. Pulmonary artery size is normal. Mediastinum/Nodes: No significant mediastinal hilar, or axillary adenopathy is present. Esophagus is within normal limits. Thoracic inlet is normal. Lungs/Pleura: Bilateral lower lobe airspace consolidation is present. Additional patchy airspace disease is present in the superior segment of both lower lobes and in the right middle lobe. Mild patchy airspace opacities are present right upper lobe as well. Musculoskeletal: Vertebral body heights are maintained. No focal lytic or blastic lesions are present. CT ABDOMEN PELVIS FINDINGS Hepatobiliary: There is diffuse fatty infiltration of the liver. No discrete lesions are present. The common bile duct and  gallbladder are within normal limits. Pancreas: Mild fatty infiltration of the pancreas is present. No discrete lesions are present. Spleen: Normal in size without focal abnormality. Adrenals/Urinary Tract: The adrenal glands are normal bilaterally. Kidneys are unremarkable. There is no stone or mass lesion. No obstruction is present. The ureters are within normal limits. The urinary bladder is decompressed with a Foley catheter in place. Stomach/Bowel: The stomach and duodenum are within limits. Small bowel is unremarkable. Terminal ileum is normal. Appendix is visualized and within limits. The ascending and transverse colon are normal. Descending and sigmoid colon are normal. Vascular/Lymphatic: Atherosclerotic changes are present in the aorta and branch vessels. Reproductive: Prostate is unremarkable. Other: No abdominal wall hernia or abnormality. No abdominopelvic ascites. Focal fluid collection present to suggest abscess Musculoskeletal: Endplate changes are present at L5-S1. Vertebral body heights are maintained. There is some straightening of lumbar lordosis. Bony pelvis is within normal limits. Hips are located and within normal limits. IMPRESSION:  1. Bilateral lower lobe and right middle lobe airspace disease compatible with multifocal pneumonia. There is likely involvement right upper lobe as well. 2. Hepatic steatosis. 3. No other acute or focal abnormality to explain the patient's abdominal pain. 4. Degenerative changes in the lower lumbar spine. 5. Aortic Atherosclerosis (ICD10-I70.0). Electronically Signed   By: San Morelle M.D.   On: 01/15/2019 16:34   CT HEAD WO CONTRAST  Result Date: 01/21/2019 CLINICAL DATA:  Focal neuro deficit greater than 6 hours. EXAM: CT HEAD WITHOUT CONTRAST TECHNIQUE: Contiguous axial images were obtained from the base of the skull through the vertex without intravenous contrast. COMPARISON:  None. FINDINGS: Brain: Age advanced cerebral atrophy, mild  ventriculomegaly and mild periventricular white matter changes. The ventricles are in the midline without mass effect or shift. They are normal in configuration. No extra-axial fluid collections are identified. No CT findings to suggest hemispheric infarction or intracranial hemorrhage. No mass lesions. The brainstem and cerebellum are grossly normal. Vascular: Age advanced vascular calcifications but no hyperdense vessels or obvious aneurysm. Skull: No skull fracture or bone lesion. Sinuses/Orbits: The paranasal sinuses and mastoid air cells are clear. The globes are intact. Other: Scattered scalp calcifications.  No lesions or hematoma. IMPRESSION: 1. Age advanced cerebral atrophy, ventriculomegaly and periventricular white matter disease. 2. No acute intracranial findings or mass lesions. Electronically Signed   By: Marijo Sanes M.D.   On: 01/21/2019 11:04   CT SOFT TISSUE NECK WO CONTRAST  Result Date: 01/15/2019 CLINICAL DATA:  COVID-19 positive.  Sepsis.  Renal insufficiency EXAM: CT NECK WITHOUT CONTRAST TECHNIQUE: Multidetector CT imaging of the neck was performed following the standard protocol without intravenous contrast. COMPARISON:  None. FINDINGS: Pharynx and larynx: Image quality degraded by motion. Normal airway.  No mass or abscess in the larynx. Salivary glands: No inflammation, mass, or stone. Thyroid: Negative Lymph nodes: No enlarged lymph nodes in the neck. Vascular: Limited vascular evaluation without intravenous contrast. Limited intracranial: Negative Visualized orbits: Negative Mastoids and visualized paranasal sinuses: Paranasal sinuses clear. Small osteoma left frontal sinus. Skeleton: Cervical spondylosis.  No acute skeletal abnormality. Upper chest: Negative Other: None IMPRESSION: No acute abnormality Exam limited by motion and lack of intravenous contrast. Electronically Signed   By: Franchot Gallo M.D.   On: 01/15/2019 16:34   CT CHEST WO CONTRAST  Result Date:  01/15/2019 CLINICAL DATA:  Abdominal pain and fever. Abscess suspected. COVID-19 pneumonia. EXAM: CT CHEST, ABDOMEN AND PELVIS WITHOUT CONTRAST TECHNIQUE: Multidetector CT imaging of the chest, abdomen and pelvis was performed following the standard protocol without IV contrast. COMPARISON:  One-view chest x-ray 01/15/2019 FINDINGS: CT CHEST FINDINGS Cardiovascular: The heart size is normal. Scratched at the heart size upper limits of normal. Atherosclerotic calcifications are noted at the aortic valve and aortic arch. There is no aneurysm. Pulmonary artery size is normal. Mediastinum/Nodes: No significant mediastinal hilar, or axillary adenopathy is present. Esophagus is within normal limits. Thoracic inlet is normal. Lungs/Pleura: Bilateral lower lobe airspace consolidation is present. Additional patchy airspace disease is present in the superior segment of both lower lobes and in the right middle lobe. Mild patchy airspace opacities are present right upper lobe as well. Musculoskeletal: Vertebral body heights are maintained. No focal lytic or blastic lesions are present. CT ABDOMEN PELVIS FINDINGS Hepatobiliary: There is diffuse fatty infiltration of the liver. No discrete lesions are present. The common bile duct and gallbladder are within normal limits. Pancreas: Mild fatty infiltration of the pancreas is present. No discrete lesions are  present. Spleen: Normal in size without focal abnormality. Adrenals/Urinary Tract: The adrenal glands are normal bilaterally. Kidneys are unremarkable. There is no stone or mass lesion. No obstruction is present. The ureters are within normal limits. The urinary bladder is decompressed with a Foley catheter in place. Stomach/Bowel: The stomach and duodenum are within limits. Small bowel is unremarkable. Terminal ileum is normal. Appendix is visualized and within limits. The ascending and transverse colon are normal. Descending and sigmoid colon are normal. Vascular/Lymphatic:  Atherosclerotic changes are present in the aorta and branch vessels. Reproductive: Prostate is unremarkable. Other: No abdominal wall hernia or abnormality. No abdominopelvic ascites. Focal fluid collection present to suggest abscess Musculoskeletal: Endplate changes are present at L5-S1. Vertebral body heights are maintained. There is some straightening of lumbar lordosis. Bony pelvis is within normal limits. Hips are located and within normal limits. IMPRESSION: 1. Bilateral lower lobe and right middle lobe airspace disease compatible with multifocal pneumonia. There is likely involvement right upper lobe as well. 2. Hepatic steatosis. 3. No other acute or focal abnormality to explain the patient's abdominal pain. 4. Degenerative changes in the lower lumbar spine. 5. Aortic Atherosclerosis (ICD10-I70.0). Electronically Signed   By: San Morelle M.D.   On: 01/15/2019 16:34   CT LUMBAR SPINE WO CONTRAST  Result Date: 01/17/2019 CLINICAL DATA:  Right lumbosacral pain radiating to the leg. Low back pain with increased fracture risk EXAM: CT LUMBAR SPINE WITHOUT CONTRAST TECHNIQUE: Multidetector CT imaging of the lumbar spine was performed without intravenous contrast administration. Multiplanar CT image reconstructions were also generated. COMPARISON:  None. FINDINGS: Segmentation: 5 lumbar type vertebrae Alignment: Normal Vertebrae: No evidence of fracture, discitis, or aggressive bone lesion Paraspinal and other soft tissues: Bilateral pneumonia in this patient with known COVID-19. Disc levels: Disc narrowing and endplate degeneration at L4-5. Mild facet spurring at L4-5 and below IMPRESSION: 1. No acute finding in the lumbar spine. 2. L4-5 moderate disc degeneration. Electronically Signed   By: Monte Fantasia M.D.   On: 01/17/2019 09:40   CT PELVIS WO CONTRAST  Result Date: 01/17/2019 CLINICAL DATA:  Pelvic fracture. EXAM: CT PELVIS WITHOUT CONTRAST TECHNIQUE: Multidetector CT imaging of the  pelvis was performed following the standard protocol without intravenous contrast. COMPARISON:  January 15, 2019. FINDINGS: Urinary Tract: Urinary bladder is decompressed secondary to Foley catheter. Bowel:  Unremarkable visualized pelvic bowel loops. Vascular/Lymphatic: Atherosclerosis of visualized abdominal aorta and iliac arteries is noted. No adenopathy is noted. Reproductive:  No mass or other significant abnormality Other:  None. Musculoskeletal: No suspicious bone lesions identified. IMPRESSION: No definite evidence of pelvic fracture or other significant bony abnormality. Aortic Atherosclerosis (ICD10-I70.0). Electronically Signed   By: Marijo Conception M.D.   On: 01/17/2019 09:45   CXR am  Result Date: 01/21/2019 CLINICAL DATA:  Shortness of breath EXAM: PORTABLE CHEST 1 VIEW COMPARISON:  01/15/2019 FINDINGS: Extensive bilateral airspace disease, progressed. Cardiomegaly that is chronic. No suspected pleural effusion. No pneumothorax. IMPRESSION: Extensive and progressive bilateral pneumonia. Electronically Signed   By: Monte Fantasia M.D.   On: 01/21/2019 09:53   CXR am  Result Date: 01/15/2019 CLINICAL DATA:  Acute shortness of breath. Follow-up COVID-19 pneumonia. EXAM: PORTABLE CHEST 1 VIEW 11:16 a.m.: COMPARISON:  Portable chest x-ray earlier same day at 2:55 a.m. FINDINGS: Cardiac silhouette enlarged for AP portable technique, unchanged. Progressive airspace consolidation involving the lower lobes and the RIGHT MIDDLE LOBE since the examination earlier this morning. Streaky opacities in the lingula. UPPER lobes remain clear otherwise. Normal  pulmonary vascularity. IMPRESSION: 1. Progressive bilateral lower lobe pneumonia since the examination earlier this morning. 2. Streaky opacities in the lingula, likely atelectasis. 3. Stable cardiomegaly without pulmonary edema. Electronically Signed   By: Evangeline Dakin M.D.   On: 01/15/2019 11:26   DG Chest Port 1 View  Result Date:  01/15/2019 CLINICAL DATA:  Shortness of breath. EXAM: PORTABLE CHEST 1 VIEW COMPARISON:  None. FINDINGS: The heart is enlarged. Patchy right greater than left airspace opacities in the lung bases. No pulmonary edema. No definite pleural effusion. No pneumothorax. No acute osseous abnormalities are seen. IMPRESSION: 1. Right greater than left basilar airspace opacities, suspicious for pneumonia. 2. Mild cardiomegaly. Electronically Signed   By: Keith Rake M.D.   On: 01/15/2019 03:19   ECHOCARDIOGRAM COMPLETE  Result Date: 01/16/2019   ECHOCARDIOGRAM REPORT   Patient Name:   SEANN GENTHER Date of Exam: 01/16/2019 Medical Rec #:  [id number]    Height: Accession #:    [id number]   Weight:       283.1 lb Date of Birth:  21-Dec-1955   BSA:          2.55 m Patient Age:    18 years     BP:           155/98 mmHg Patient Gender: M            HR:           105 bpm. Exam Location:  Inpatient Procedure: 2D Echo Indications:    Chest Pain 786.50 / R07.9  History:        Patient has no prior history of Echocardiogram examinations.                 Risk Factors:Hypertension and Diabetes. COVID 19.  Sonographer:    Darlina Sicilian RDCS Referring Phys: Graylin Shiver [facility]  Sonographer Comments: Echo performed with the patient sitting upright in his chair IMPRESSIONS  1. Left ventricular ejection fraction, by visual estimation, is 60 to 65%. The left ventricle has normal function. There is mildly increased left ventricular hypertrophy.  2. Left ventricular diastolic parameters are consistent with Grade I diastolic dysfunction (impaired relaxation).  3. The left ventricle has no regional wall motion abnormalities.  4. Global right ventricle has normal systolic function.The right ventricular size is normal.  5. Left atrial size was normal.  6. Right atrial size was normal.  7. Mild mitral annular calcification.  8. The mitral valve is normal in structure. No evidence of mitral valve regurgitation. No evidence of mitral  stenosis.  9. The tricuspid valve is normal in structure. Tricuspid valve regurgitation is mild. 10. The aortic valve has an indeterminant number of cusps. Aortic valve regurgitation is not visualized. Mild to moderate aortic valve sclerosis/calcification without any evidence of aortic stenosis. 11. The pulmonic valve was not well visualized. Pulmonic valve regurgitation is not visualized. 12. Technically difficult; normal LV systolic function; grade 1 diastolic dysfunction; mild LVH with mild proximal septal thickening; calcified aortic valve (not well interrogated but no clear AS by doppler). FINDINGS  Left Ventricle: Left ventricular ejection fraction, by visual estimation, is 60 to 65%. The left ventricle has normal function. The left ventricle has no regional wall motion abnormalities. There is mildly increased left ventricular hypertrophy. Left ventricular diastolic parameters are consistent with Grade I diastolic dysfunction (impaired relaxation). Normal left atrial pressure. Right Ventricle: The right ventricular size is normal.Global RV systolic function is has normal systolic function. Left Atrium: Left atrial size  was normal in size. Right Atrium: Right atrial size was normal in size Pericardium: There is no evidence of pericardial effusion. Mitral Valve: The mitral valve is normal in structure. Mild mitral annular calcification. No evidence of mitral valve regurgitation. No evidence of mitral valve stenosis by observation. Tricuspid Valve: The tricuspid valve is normal in structure. Tricuspid valve regurgitation is mild. Aortic Valve: The aortic valve has an indeterminant number of cusps. Aortic valve regurgitation is not visualized. Mild to moderate aortic valve sclerosis/calcification is present, without any evidence of aortic stenosis. Pulmonic Valve: The pulmonic valve was not well visualized. Pulmonic valve regurgitation is not visualized. Pulmonic regurgitation is not visualized. Aorta: The aortic  root is normal in size and structure. Venous: The inferior vena cava was not well visualized.  Additional Comments: Technically difficult; normal LV systolic function; grade 1 diastolic dysfunction; mild LVH with mild proximal septal thickening; calcified aortic valve (not well interrogated but no clear AS by doppler).  LEFT VENTRICLE PLAX 2D LVIDd:         3.78 cm  Diastology LVIDs:         3.04 cm  LV e' lateral:   10.60 cm/s LV PW:         0.90 cm  LV E/e' lateral: 9.0 LV IVS:        1.60 cm  LV e' medial:    6.96 cm/s LVOT diam:     1.90 cm  LV E/e' medial:  13.7 LV SV:         25 ml LVOT Area:     2.84 cm  LEFT ATRIUM             Index LA diam:        3.50 cm 1.37 cm/m LA Vol (A2C):   64.9 ml 25.50 ml/m LA Vol (A4C):   37.7 ml 14.81 ml/m LA Biplane Vol: 54.7 ml 21.49 ml/m  AORTIC VALVE LVOT Vmax:   99.20 cm/s LVOT Vmean:  59.300 cm/s LVOT VTI:    0.156 m  AORTA Ao Root diam: 3.10 cm MITRAL VALVE                         TRICUSPID VALVE MV Area (PHT): 7.09 cm              TR Peak grad:   35.0 mmHg MV PHT:        31.03 msec            TR Vmax:        296.00 cm/s MV Decel Time: 107 msec MV E velocity: 95.50 cm/s  103 cm/s  SHUNTS MV A velocity: 120.00 cm/s 70.3 cm/s Systemic VTI:  0.16 m MV E/A ratio:  0.80        1.5       Systemic Diam: 1.90 cm  Kirk Ruths MD Electronically signed by Kirk Ruths MD Signature Date/Time: 01/16/2019/1:35:47 PM    Final

## 2019-01-21 NOTE — Progress Notes (Signed)
   Vital Signs MEWS/VS Documentation      01/20/2019 2300 01/21/2019 0000 01/21/2019 0400 01/21/2019 0801   MEWS Score:  1  0  0  2   MEWS Score Color:  Green  Green  Green  Yellow   Resp:  18  20  19  20    Pulse:  (!) 101  96  97  --   BP:  127/79  133/83  (!) 150/88  (!) 154/90   Temp:  99 F (37.2 C)  98.5 F (36.9 C)  98.6 F (37 C)  (!) 102.1 F (38.9 C)   O2 Device:  --  HFNC  HFNC  HFNC   O2 Flow Rate (L/min):  --  --  --  5 L/min   Level of Consciousness:  Alert  Alert  --  Alert      Pt currently a yellow mews d/t his temp. PRN tylenol to be given and will follow mews protocol.       Christella Hartigan 01/21/2019,8:22 AM

## 2019-01-21 NOTE — Evaluation (Addendum)
Clinical/Bedside Swallow Evaluation Patient Details  Name: Austin Shannon MRN: [id number] Date of Birth: 04/27/1955  Today's Date: 01/21/2019 Time: SLP Start Time (ACUTE ONLY): 1350 SLP Stop Time (ACUTE ONLY): 1405 SLP Time Calculation (min) (ACUTE ONLY): 15 min  Past Medical History:  Past Medical History:  Diagnosis Date  . Diabetes mellitus without complication ([place])   . Hypertension    Past Surgical History: History reviewed. No pertinent surgical history. HPI:  Austin Shannon  is a 63 y.o. 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  Swallow was evaluated on 12/10 with no irregular findings and no f/u was recommended.  New orders received 12/15.  CXR 12/15 Extensive and progressive bilateral pneumonia.  CT head 12/15 normal.    Assessment / Plan / Recommendation Clinical Impression  Pt participated in clinical swallow assessment.  Affect flat; alert, communicative, with strong voice/cough.  Normal cranial nerve exam with normal motor and sensory function. Presented with adequate mastication of solids, brisk swallow response, and no s/s of aspiration, even when taxed with 3 oz of water and mixed consistencies.  No s/s of an oropharyngeal dysphagia. Recommend continuing current diet.  No SLP f/u is needed.   SLP Visit Diagnosis: Dysphagia, unspecified (R13.10)    Aspiration Risk   low   Diet Recommendation   regular solids, thin liquids  Medication Administration: Whole meds with liquid    Other  Recommendations Oral Care Recommendations: Oral care BID   Follow up Recommendations None      Frequency and Duration            Prognosis        Swallow Study   General HPI: Austin Shannon  is a 63 y.o.  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  Swallow was evaluated on 12/10 with no irregular findings and no f/u was recommended.  New orders received 12/15.  CXR 12/15 Extensive and progressive bilateral pneumonia.  CT head 12/15 normal.  Type of Study: Bedside Swallow Evaluation Previous Swallow Assessment: see HPI Diet Prior to this Study: Regular;Thin liquids Temperature Spikes Noted: Yes Respiratory Status: Nasal cannula History of Recent Intubation: No Behavior/Cognition: Alert;Cooperative(flat affect) Oral Cavity Assessment: Within Functional Limits Oral Care Completed by SLP: No Oral Cavity - Dentition: Adequate natural dentition Vision: Functional for self-feeding Self-Feeding Abilities: Able to feed self Patient Positioning: Upright in bed Baseline Vocal Quality: Normal Volitional Cough: Strong Volitional Swallow: Able to elicit    Oral/Motor/Sensory Function Overall Oral Motor/Sensory Function: Within functional limits   Ice Chips Ice chips: Within functional limits   Thin Liquid Thin Liquid: Within functional limits    Nectar Thick Nectar Thick Liquid: Not tested   Honey Thick Honey Thick Liquid: Not tested   Puree Puree: Within functional limits   Solid     Solid: Within functional limits      Juan Quam Laurice 01/21/2019,2:08 PM  Estill Bamberg L. Tivis Ringer, [place] Office number [phone]

## 2019-01-21 NOTE — Consult Note (Addendum)
Requesting Physician: Dr. Thedore MinsSingh    Chief Complaint: Right-sided weakness  History obtained from: Patient and Chart    HPI:                                                                                                                                       Austin Shannon is a 63 y.o. male with past medical history significant for diabetes mellitus type 2, hypertension, prior history of TB that was treated, hyperlipidemia [facility] with COVID-19 related pneumonia and septic shock.   Per chart review, patient also has been gradually becoming weaker over the past week prior to admission and was having multiple falls at home.  He was found on the floor, hypoxic, [facility] where he was noted to be in septic shock, COVID-19 pneumonia, acute renal failure, lactic acidosis and [facility]. Patient has been complaining of right-sided lumbosacral discomfort, initially thought to be musculoskeletal due to his fall at home with CT of the L-spine and pelvis unremarkable.  Patient also underwent CT head which was unremarkable .Neurology consulted for his right side weakness noted during hospitalization.   Past Medical History:  Diagnosis Date  . Diabetes mellitus without complication (HCC)   . Hypertension     History reviewed. No pertinent surgical history.  History reviewed. No pertinent family history. Social History:  reports that he has quit smoking. He has never used smokeless tobacco. He reports that he does not use drugs. No history on file for alcohol.  Allergies: No Known Allergies  Medications:                                                                                                                        I reviewed home medications   ROS:  14 systems reviewed and negative except above    Examination:                                                                                                       General: Appears well-developed and well-nourished.  Psych: Affect appropriate to situation Eyes: No scleral injection HENT: No OP obstrucion Head: Normocephalic.  Cardiovascular: Normal rate and regular rhythm.  Respiratory: Effort normal and breath sounds normal to anterior ascultation GI: Soft.  No distension. There is no tenderness.  Skin: WDI    Neurological Examination Mental Status: Alert,oriented to person and month but not place, confused as to why he is the hospital. Speech fluent without evidence of aphasia. Able to follow 3 step commands without difficulty. Cranial Nerves: II: visual fields grossly normal, pupils equal, round, reactive to light and accommodation III,IV, VI: ptosis not present, extraocular muscles extra-ocular motions intact bilaterally V,VII: smile symmetric, facial light touch sensation normal bilaterally VIII: hearing normal bilaterally IX,X: gag reflex present XI: trapezius strength/neck flexion strength normal bilaterally XII: tongue strength normal  Motor: Right : Upper extremity    Left:     Upper extremity 5/5 deltoid       5/5 deltoid 5/5 tricep      5/5 tricep 5/5 biceps      5/5 biceps  5/5wrist flexion     5/5 wrist flexion 5/5 wrist extension     5/5 wrist extension 5/5 hand grip      5/5 hand grip  Lower extremity     Lower extremity 5/5 hip flexor      5/5 hip flexor 5/5 hip adductors     5/5 hip adductors 5/5 hip abductors     5/5 hip abductors 5/5 quadricep      5/5 quadriceps  5/5 hamstrings     5/5 hamstrings 5/5 plantar flexion       5/5 plantar flexion 5/5 plantar extension     5/5 plantar extension Tone and bulk:normal tone throughout; no atrophy noted Sensory: Pinprick and light touch intact throughout, bilaterally Deep Tendon Reflexes:  Right: Upper Extremity   Left: Upper extremity   biceps (C-5 to C-6) 3/4   biceps (C-5 to C-6)  2/4 tricep (C7) 3/4    triceps (C7) 2/4 Brachioradialis (C6) 3/4  Brachioradialis (C6) 2/4  Lower Extremity Lower Extremity  quadriceps (L-2 to L-4) 3/4   quadriceps (L-2 to L-4) 2/4 Achilles (S1) 1/4   Achilles (S1) 1/4 Plantars: Right: downgoing   Left: downgoing Cerebellar: normal finger-to-nose and heel to shin     Lab Results: Basic Metabolic Panel: Recent Labs  Lab 01/16/19 0445 01/17/19 0345 01/18/19 0357 01/19/19 0609 01/20/19 1020 01/21/19 0440  NA 140 142 141 141 137 140  K 4.1 4.2 4.3 3.9 4.1 4.1  CL 103 101 100 98 94* 94*  CO2 33* 33* 34*  GLUCOSE 202* 213* 113* 51* 159* 74  BUN 36* 35* 30* 26* 27* 28*  CREATININE 1.86* 1.43* 1.32* 1.40* 1.27* 1.50*  CALCIUM 8.1* 8.0* 7.7* 7.6* 7.7* 8.3*  MG  2.2 2.6* 2.8* 2.5*  --  2.4    CBC: Recent Labs  Lab 01/16/19 0445 01/17/19 0345 01/18/19 0357 01/19/19 0609 01/20/19 1020 01/21/19 0440  WBC 7.1 8.1 13.1* 13.9* 13.0* 15.2*  NEUTROABS 5.6 7.0 11.4* 11.0*  --  10.9*  HGB 13.8 12.0* 11.9* 12.4* 12.2* 13.7  HCT 45.4 39.1 39.8 40.4 38.7* 45.8  MCV 80.2 79.6* 80.1 79.5* 78.5* 79.8*  PLT 192 198 235 270 316 412*    Coagulation Studies: No results for input(s): LABPROT, INR in the last 72 hours.  Imaging: CT HEAD WO CONTRAST  Result Date: 01/21/2019 CLINICAL DATA:  Focal neuro deficit greater than 6 hours. EXAM: CT HEAD WITHOUT CONTRAST TECHNIQUE: Contiguous axial images were obtained from the base of the skull through the vertex without intravenous contrast. COMPARISON:  None. FINDINGS: Brain: Age advanced cerebral atrophy, mild ventriculomegaly and mild periventricular white matter changes. The ventricles are in the midline without mass effect or shift. They are normal in configuration. No extra-axial fluid collections are identified. No CT findings to suggest hemispheric infarction or intracranial hemorrhage. No mass lesions. The brainstem and cerebellum are grossly normal. Vascular: Age advanced  vascular calcifications but no hyperdense vessels or obvious aneurysm. Skull: No skull fracture or bone lesion. Sinuses/Orbits: The paranasal sinuses and mastoid air cells are clear. The globes are intact. Other: Scattered scalp calcifications.  No lesions or hematoma. IMPRESSION: 1. Age advanced cerebral atrophy, ventriculomegaly and periventricular white matter disease. 2. No acute intracranial findings or mass lesions. Electronically Signed   By: Marijo Sanes M.D.   On: 01/21/2019 11:04   CXR am  Result Date: 01/21/2019 CLINICAL DATA:  Shortness of breath EXAM: PORTABLE CHEST 1 VIEW COMPARISON:  01/15/2019 FINDINGS: Extensive bilateral airspace disease, progressed. Cardiomegaly that is chronic. No suspected pleural effusion. No pneumothorax. IMPRESSION: Extensive and progressive bilateral pneumonia. Electronically Signed   By: Monte Fantasia M.D.   On: 01/21/2019 09:53     I have reviewed the above imaging : CT head, CT L spine, reviewed report of pelvis   Ct head showed significant white matter disease   ASSESSMENT AND PLAN   63 y.o. male with past medical history significant for diabetes mellitus type 2, hypertension, prior history of TB that was treated, hyperlipidemia admitted at [facility] with COVID-19 related pneumonia and septic shock, neurology consulted for intermittent weakness on right side.  On my exam while I could not appreciate any weakness, he did have asymetric reflexes on right side ( more brisk) than left side. No clonus, babinski's reflex absent. Asymmetric reflexes are often explained in the setting of chronic stroke, cervical myelopathy caused by spondylosis. He certainly has significant white matter disease to support this.  Given patient's intermittent weakness, recrudescence of symptoms of old stroke in the setting of infection is certainly possible. A subacute infarct also possible, however we do not see any acute findings on CT head and in the absence of facial droop,  slurred speech would defer MRI brain for now.   However, there is small chance that he could have myelopathy  from an abscess and should be considered if no other source for fever identified or if patient has bacteremia with no clear source. Given minimal symptoms, even abscess found would unlikely require any surgical intervention and would recommend continuation with antibiotics. However, if patient develops right side weakness/worsens and continues to have fever consider MRI C, T and L spine.   Recommendations Continue PT/OT Continue antibiotics  Consider MRI Brain, C spine,  T and L spine if patent continues to have worsening right side weakness or persistent fevers not otherwise explained. Alternatively, CT w/o contrast can be performed if there is specific concern for abscess,    Avalee Castrellon [facility] Pager Number [phone]

## 2019-01-21 NOTE — Progress Notes (Signed)
Pharmacy Antibiotic Note  Austin Shannon is a 63 y.o. male admitted on 01/15/2019.  Pharmacy has been consulted for Unasyn dosing for re-aspiration and febrile.  MSSA in tracheal aspirate culture.   Plan: Unasyn 3g IV q6h  - currently dosing from 12/9 - 12/16  - Stop date removed, f/u LOT.  Height: 6\' 2"  (188 cm) Weight: 283 lb 1.1 oz (128.4 kg) IBW/kg (Calculated) : 82.2  Temp (24hrs), Avg:100.2 F (37.9 C), Min:98.5 F (36.9 C), Max:102.3 F (39.1 C)  Recent Labs  Lab 01/15/19 0257 01/15/19 1136 01/16/19 0445 01/17/19 0345 01/18/19 0357 01/19/19 0609 01/20/19 1020 01/21/19 0440  WBC 8.4 6.6 7.1 8.1 13.1* 13.9* 13.0* 15.2*  CREATININE 1.99* 2.31* 1.86* 1.43* 1.32* 1.40* 1.27* 1.50*  LATICACIDVEN 6.0* 3.6* 1.7  --   --   --   --   --     Estimated Creatinine Clearance: 71.8 mL/min (A) (by C-G formula based on SCr of 1.5 mg/dL (H)).    No Known Allergies  Antimicrobials this admission: 12/9 Unasyn >>  12/9 Remdesivir >> 12/13 12/14 Doxycycline >>  Dose adjustments this admission:  Microbiology results: 12/9 BCx: neg 12/9 UCx: neg 12/9 Tracheal aspirate: MSSA (final)  Thank you for allowing pharmacy to be a part of this patient's care.  Gretta Arab PharmD, BCPS Clinical pharmacist phone 7am- 5pm: [phone] 01/21/2019 1:26 PM

## 2019-01-22 LAB — CBC WITH DIFFERENTIAL/PLATELET
Abs Immature Granulocytes: 0.91 10*3/uL — ABNORMAL HIGH (ref 0.00–0.07)
Basophils Absolute: 0.1 10*3/uL (ref 0.0–0.1)
Basophils Relative: 1 %
Eosinophils Absolute: 0 10*3/uL (ref 0.0–0.5)
Eosinophils Relative: 0 %
HCT: 43.1 % (ref 39.0–52.0)
Hemoglobin: 13 g/dL (ref 13.0–17.0)
Immature Granulocytes: 5 %
Lymphocytes Relative: 7 %
Lymphs Abs: 1.2 10*3/uL (ref 0.7–4.0)
MCH: 24 pg — ABNORMAL LOW (ref 26.0–34.0)
MCHC: 30.2 g/dL (ref 30.0–36.0)
MCV: 79.7 fL — ABNORMAL LOW (ref 80.0–100.0)
Monocytes Absolute: 1 10*3/uL (ref 0.1–1.0)
Monocytes Relative: 6 %
Neutro Abs: 14.1 10*3/uL — ABNORMAL HIGH (ref 1.7–7.7)
Neutrophils Relative %: 81 %
Platelets: 441 10*3/uL — ABNORMAL HIGH (ref 150–400)
RBC: 5.41 MIL/uL (ref 4.22–5.81)
RDW: 15.1 % (ref 11.5–15.5)
WBC: 17.2 10*3/uL — ABNORMAL HIGH (ref 4.0–10.5)
nRBC: 0 % (ref 0.0–0.2)

## 2019-01-22 LAB — COMPREHENSIVE METABOLIC PANEL
ALT: 43 U/L (ref 0–44)
AST: 34 U/L (ref 15–41)
Albumin: 2.7 g/dL — ABNORMAL LOW (ref 3.5–5.0)
Alkaline Phosphatase: 70 U/L (ref 38–126)
Anion gap: 12 (ref 5–15)
BUN: 33 mg/dL — ABNORMAL HIGH (ref 8–23)
CO2: 30 mmol/L (ref 22–32)
Calcium: 8.1 mg/dL — ABNORMAL LOW (ref 8.9–10.3)
Chloride: 94 mmol/L — ABNORMAL LOW (ref 98–111)
Creatinine, Ser: 1.54 mg/dL — ABNORMAL HIGH (ref 0.61–1.24)
GFR calc Af Amer: 55 mL/min — ABNORMAL LOW (ref >60)
GFR calc non Af Amer: 47 mL/min — ABNORMAL LOW (ref >60)
Glucose, Bld: 352 mg/dL — ABNORMAL HIGH (ref 70–99)
Potassium: 5.1 mmol/L (ref 3.5–5.1)
Sodium: 136 mmol/L (ref 135–145)
Total Bilirubin: 0.2 mg/dL — ABNORMAL LOW (ref 0.3–1.2)
Total Protein: 7.4 g/dL (ref 6.5–8.1)

## 2019-01-22 LAB — MAGNESIUM: Magnesium: 2.5 mg/dL — ABNORMAL HIGH (ref 1.7–2.4)

## 2019-01-22 LAB — GLUCOSE, CAPILLARY
Glucose-Capillary: 333 mg/dL — ABNORMAL HIGH (ref 70–99)
Glucose-Capillary: 271 mg/dL — ABNORMAL HIGH (ref 70–99)
Glucose-Capillary: 239 mg/dL — ABNORMAL HIGH (ref 70–99)
Glucose-Capillary: 372 mg/dL — ABNORMAL HIGH (ref 70–99)
Glucose-Capillary: 368 mg/dL — ABNORMAL HIGH (ref 70–99)

## 2019-01-22 LAB — BRAIN NATRIURETIC PEPTIDE: B Natriuretic Peptide: 36.8 pg/mL (ref 0.0–100.0)

## 2019-01-22 LAB — C-REACTIVE PROTEIN: CRP: 22.5 mg/dL — ABNORMAL HIGH (ref <1.0)

## 2019-01-22 LAB — PROCALCITONIN: Procalcitonin: 2.65 ng/mL

## 2019-01-22 LAB — D-DIMER, QUANTITATIVE: D-Dimer, Quant: 1.2 ug/mL-FEU — ABNORMAL HIGH (ref 0.00–0.50)

## 2019-01-22 MED ORDER — SENNA 8.6 MG PO TABS: 1.0000 | ORAL_TABLET | Freq: Every day | ORAL | Status: DC | PRN

## 2019-01-22 NOTE — Progress Notes (Signed)
Pt overall had a pleasant night. tx to bed from chair, linen change and peri care performed. Pt was observed trying to get oob at one point this shift, redirected and encouraged to call for assistance- pt verbalizes understanding. Tolerated all medications this shift. Pt more alert and asking questions this shift compared to other nights. Periodic desaturations this shift when sleeping. No acute events tonight.  Bed alarm on and working.

## 2019-01-22 NOTE — Progress Notes (Signed)
   Vital Signs MEWS/VS Documentation      01/22/2019 1028 01/22/2019 1200 01/22/2019 1717 01/22/2019 1718   MEWS Score:  2  2  6  6    MEWS Score Color:  Yellow  Yellow  Red  Red   Resp:  (!) 24  (!) 29  (!) 30  (!) 33   BP:  130/74  119/78  --  133/60   Temp:  --  99.1 F (37.3 C)  (!) 102 F (38.9 C)  --   O2 Device:  --  HFNC  HFNC  --   O2 Flow Rate (L/min):  --  6 L/min  6 L/min  --   Level of Consciousness:  --  --  Alert  --     Patient was found with 02 off, naked, shivering and disoriented. 02 replaced, sat's in the 80's but quickly returned to 95%. Temp 102, PRN tylenol given. MD paged, see orders.      Day Greb C Holdson 01/22/2019,5:45 PM

## 2019-01-22 NOTE — Progress Notes (Addendum)
PROGRESS NOTE                                                                                                                                                                                                             Patient Demographics:    Austin Shannon, is a 63 y.o. male, DOB - 01/22/1956, XNA:[id number]  Outpatient Primary MD for the patient is Patient, No Pcp Per    LOS - 7  Admit date - 01/15/2019    CC - AMS     Brief Narrative -  Austin Shannon  is a 63 y.o. 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   Subjective:   Overnight, patient currently feels somewhat improved denies chest pain, headache, fevers, chills.  Patient does report issues with constipation over the past 48 hours.   Assessment  & Plan :      **Addendum - patient continues to have recurrent fevers today with acute episode of metabolic encephalopathy, will order MRI head cervical thoracic and lumbar spines per discussion with neurology previously.  Unclear if infectious in nature versus organic given concern for recrudescence of old stroke in the setting of acute infection**    Acute hypoxic respiratory failure 2/2 sepsis, COVID-19 pneumonia, with concurrent bacterial infection(likely aspiration pneumonia). Extremely high procalcitonin in the range of 100, elevated lactic acid, elevated creatinine and high CRP.  He also has history of treated TB in the past.  Initially placed on IV remdesivir, IV  steroids, IV empiric antibiotics (Unasyn and doxycycline).  Clinically he has improved, noncontrast CT neck, chest, abdomen and pelvis unremarkable.  He has been cleared by speech but I still suspect he is aspirating Continue Unasyn(DC 18th) and doxycycline (DC 12/23) for full 10-day course Continue to monitor blood cultures.  Respiratory cultures growing few colonies of staph aureus and he is already on doxycycline.  SpO2: 94 % O2 Flow Rate (L/min): 6 L/min  Recent Labs  Lab 01/16/19 0445 01/17/19 0345 01/18/19 0357 01/19/19 0609 01/19/19 0610 01/21/19 0440 01/22/19  0135  CRP 43.8* 21.3* 11.9*  --  11.3* 19.9* 22.5*  DDIMER 5.04* 3.33* 1.93* 1.77*  --  1.42* 1.20*  BNP 61.0 82.0 134.1* 167.1*  --  39.6 36.8  PROCALCITON 96.21 53.75 27.77  --   --  4.00 2.65    Hepatic Function Latest Ref Rng & Units 01/22/2019 01/21/2019 01/19/2019  Total Protein 6.5 - 8.1 g/dL 7.4 7.6 6.3(L)  Albumin 3.5 - 5.0 g/dL 2.7(L) 3.0(L) 2.7(L)  AST 15 - 41 U/L 34 30 37  ALT 0 - 44 U/L 43 41 36  Alk Phosphatase 38 - 126 U/L 70 69 68  Total Bilirubin 0.3 - 1.2 mg/dL 0.2(L) 0.5 0.9    Dehydration, rhabdomyolysis with AKI and hyperkalemia.  Continue Foley for now, CT scan nonacute, improving with hydration and Kayexalate, CK has improved. IV fluids stopped, tolerating PO well.  No baseline creatinine in system patient is from [place].  Elevated liver enzymes.  Due to COVID-19 along with dehydration/rhabdomyolysis, completely resolved.  Dyslipidemia. Resume statin.  DM type II.  Reduced Lantus, only sliding scale.   Poor outpatient glycemic control due to hyperglycemia.   Diabetic and insulin education ordered.  Dose adjusted 12/14.  CBG (last 3)  Recent Labs    01/22/19 0537 01/22/19 0812 01/22/19 1120  GLUCAP 271* 239* 372*   Lab Results  Component Value Date   HGBA1C 8.2 (H) 01/15/2019    Toxic encephalopathy.  Improving with supportive care no focal deficits.  Borderline  elevated troponin.  Not an ACS pattern, chest pain-free, EKG nonacute, aspirin, beta-blocker, stable echo.  Hypertension.  Stable on Coreg and Norvasc.  Dysphagia and odynophagia. Soft tissue neck CT unremarkable, non-contrast due to renal failure, supportive care with antibiotics, throat discomfort has resolved. Speech following and currently on regular diet per speech.  Right-sided lumbosacral discomfort.  Most likely musculoskeletal due to his recent fall at home, CT L-spine and pelvis unremarkable and nonacute.  Supportive care with NSAID cream, much improved.  Intermittent right leg weakness, minimal right arm weakness.  Initially thought to be due to lumbosacral discomfort, worse on 01/21/2019, head CT unremarkable, currently on aspirin and statin.  Neurology has been consulted - holding off on imaging at this time given resolution of symptoms  Consider MRI of the brain/spine if acutely worsens/changes clinically/or recurrent fever.  Febrile event x1 on 01/21/2019.   He was initially febrile when he came in, this resolved after he was started on Covid treatment along with antibiotics for aspiration Concern for intermittent aspirating while supine and eating Will continue Unasyn and continue doxycycline as above.   Repeat blood cultures ending, currently unremarkable Speech continues to evaluate with no overt signs or symptoms of aspiration Neuro following as above; consider MRI of the brain/spine if recurrent fever worsening pain or deficits as above Sputum cultures grew few staph aureus for which she is on doxycycline.  Procalcitonin trending down.   Mild acute on chronic diastolic CHF EF 37% on echocardiogram.   Continue beta-blocker. Adequate urine output over the past 24 hours after single dose of Lasix Continue to follow I's and O's   Condition - Guarded Family Communication  :  Wife 12/9, 12/10, 12/12 Code Status :  DNR Diet :  Diet Order            Diet Carb  Modified Fluid consistency: Thin; Room service appropriate? Yes  Diet effective now              Disposition Plan  :  PCU Consults  : Neurology Dr. Lorraine Lax Procedures  :    CT soft tissue neck, chest abdomen pelvis.  All noncontrast due to AKI.  Nonacute except for bilateral pneumonia.  CT L-spine and pelvis.  Nonacute.  Echocardiogram    1. Left ventricular ejection fraction, by visual estimation, is 60 to 65%. The left ventricle has normal function. There is mildly increased left ventricular hypertrophy.  2. Left ventricular diastolic parameters are consistent with Grade I diastolic dysfunction (impaired relaxation).  3. The left ventricle has no regional wall motion abnormalities.  4. Global right ventricle has normal systolic function.The right ventricular size is normal.  5. Left atrial size was normal.  6. Right atrial size was normal.  7. Mild mitral annular calcification.  8. The mitral valve is normal in structure. No evidence of mitral valve regurgitation. No evidence of mitral stenosis.  9. The tricuspid valve is normal in structure. Tricuspid valve regurgitation is mild. 10. The aortic valve has an indeterminant number of cusps. Aortic valve regurgitation is not visualized. Mild to moderate aortic valve sclerosis/calcification without any evidence of aortic stenosis. 11. The pulmonic valve was not well visualized. Pulmonic valve regurgitation is not visualized. 12. Technically difficult; normal LV systolic function; grade 1 diastolic dysfunction; mild LVH with mild proximal septal thickening; calcified aortic valve (not well interrogated but no clear AS by doppler).   PUD Prophylaxis : PPI  DVT Prophylaxis:  Switch to high-dose prophylactic Lovenox on 01/16/2019  Lab Results  Component Value Date   PLT 441 (H) 01/22/2019    Inpatient Medications  Scheduled Meds:  amLODipine  10 mg Oral Daily   aspirin EC  325 mg Oral Daily   atorvastatin  80 mg Oral q1800     carvedilol  6.25 mg Oral BID   Chlorhexidine Gluconate Cloth  6 each Topical Daily   diclofenac Sodium  2 g Topical QID   docusate sodium  100 mg Oral BID   doxycycline  100 mg Oral Q12H   enoxaparin (LOVENOX) injection  65 mg Subcutaneous Q24H   insulin aspart  0-20 Units Subcutaneous Q4H   insulin aspart  3 Units Subcutaneous TID WC   insulin glargine  25 Units Subcutaneous BID   insulin starter kit- syringes  1 kit Other Once   methylPREDNISolone (SOLU-MEDROL) injection  40 mg Intravenous Q12H   pantoprazole  40 mg Oral BID   Continuous Infusions:  ampicillin-sulbactam (UNASYN) IV 3 g (01/22/19 1241)   PRN Meds:.acetaminophen, acetaminophen, menthol-cetylpyridinium, metoprolol tartrate, [DISCONTINUED] ondansetron **OR** ondansetron (ZOFRAN) IV, senna, traMADol  Antibiotics  :    Anti-infectives (From admission, onward)   Start     Dose/Rate Route Frequency Ordered Stop   01/21/19 1800  Ampicillin-Sulbactam (UNASYN) 3 g in sodium chloride 0.9 % 100 mL IVPB     3 g 200 mL/hr over 30 Minutes Intravenous Every 6 hours 01/21/19 1325     01/20/19 2200  doxycycline (VIBRA-TABS) tablet 100 mg     100 mg Oral Every 12 hours 01/20/19 1827     01/16/19 1000  remdesivir 100 mg in sodium chloride 0.9 % 100 mL IVPB  Status:  Discontinued     100 mg 200 mL/hr over 30 Minutes Intravenous Daily 01/15/19 1104 01/15/19 1107   01/16/19 1000  remdesivir 100 mg in sodium chloride 0.9 % 100 mL IVPB     100 mg 200 mL/hr over 30 Minutes Intravenous Daily 01/15/19 1114 01/19/19 1100   01/15/19 1200  remdesivir 200 mg in  sodium chloride 0.9% 250 mL IVPB  Status:  Discontinued     200 mg 580 mL/hr over 30 Minutes Intravenous Once 01/15/19 1104 01/15/19 1107   01/15/19 1200  Ampicillin-Sulbactam (UNASYN) 3 g in sodium chloride 0.9 % 100 mL IVPB  Status:  Discontinued     3 g 200 mL/hr over 30 Minutes Intravenous Every 6 hours 01/15/19 1107 01/21/19 1325       Time Spent in minutes   30   Little Ishikawa M.D on 01/22/2019 at 4:22 PM  To page go to [email or web address] - password [facility]  [facility] -  Office  [phone]  See all Orders from today for further details    Objective:   Vitals:   01/22/19 0500 01/22/19 0743 01/22/19 1028 01/22/19 1200  BP: 136/83 (!) 142/87 130/74 119/78  Pulse: 86 91    Resp: 16 (!) 26 (!) 24 (!) 29  Temp: 99.3 F (37.4 C) 98.6 F (37 C)  99.1 F (37.3 C)  TempSrc: Axillary Axillary  Oral  SpO2: 92% 90%  94%  Weight:      Height:        Wt Readings from Last 3 Encounters:  01/15/19 128.4 kg  01/15/19 128.4 kg     Intake/Output Summary (Last 24 hours) at 01/22/2019 1622 Last data filed at 01/22/2019 1500 Gross per 24 hour  Intake 2320 ml  Output 4850 ml  Net -2530 ml     Physical Exam  General:  Pleasantly resting in bed, No acute distress.  Somewhat flat mood/affect, alert oriented x4 HEENT:  Normocephalic atraumatic.  Sclerae nonicteric, noninjected.  Extraocular movements intact bilaterally. Neck:  Without mass or deformity.  Trachea is midline. Lungs: Coarse breath sounds bilaterally without overt rhonchi, wheeze, or rales. Heart:  Regular rate and rhythm.  Without murmurs, rubs, or gallops. Abdomen:  Soft, nontender, nondistended.  Without guarding or rebound. Extremities: Without cyanosis, clubbing, edema, or obvious deformity. Vascular:  Dorsalis pedis and posterior tibial pulses palpable bilaterally. Skin:  Warm and dry, no erythema, no ulcerations.     Data Review:    CBC Recent Labs  Lab 01/17/19 0345 01/18/19 0357 01/19/19 0609 01/20/19 1020 01/21/19 0440 01/22/19 0135  WBC 8.1 13.1* 13.9* 13.0* 15.2* 17.2*  HGB 12.0* 11.9* 12.4* 12.2* 13.7 13.0  HCT 39.1 39.8 40.4 38.7* 45.8 43.1  PLT 198 235 270 316 412* 441*  MCV 79.6* 80.1 79.5* 78.5* 79.8* 79.7*  MCH 24.4* 23.9* 24.4* 24.7* 23.9* 24.0*  MCHC 30.7 29.9* 30.7 31.5 29.9* 30.2  RDW 15.2 15.4 15.2 15.0 15.1 15.1  LYMPHSABS  0.6* 0.8 1.3  --  1.8 1.2  MONOABS 0.4 0.8 1.2*  --  1.3* 1.0  EOSABS 0.0 0.0 0.0  --  0.0 0.0  BASOSABS 0.0 0.0 0.0  --  0.1 0.1    Chemistries  Recent Labs  Lab 01/17/19 0345 01/18/19 0357 01/19/19 0609 01/20/19 1020 01/21/19 0440 01/22/19 0135  NA 142 141 141 137 140 136  K 4.2 4.3 3.9 4.1 4.1 5.1  CL 101 100 98 94* 94* 94*  CO2 28 29 33* 33* 34* 30  GLUCOSE 213* 113* 51* 159* 74 352*  BUN 35* 30* 26* 27* 28* 33*  CREATININE 1.43* 1.32* 1.40* 1.27* 1.50* 1.54*  CALCIUM 8.0* 7.7* 7.6* 7.7* 8.3* 8.1*  MG 2.6* 2.8* 2.5*  --  2.4 2.5*  AST 65* 48* 37  --  30 34  ALT 40 41 36  --  41 43  ALKPHOS  49 55 68  --  69 70  BILITOT 0.3 0.6 0.9  --  0.5 0.2*   ------------------------------------------------------------------------------------------------------------------ No results for input(s): CHOL, HDL, LDLCALC, TRIG, CHOLHDL, LDLDIRECT in the last 72 hours.  Lab Results  Component Value Date   HGBA1C 8.2 (H) 01/15/2019   ------------------------------------------------------------------------------------------------------------------ No results for input(s): TSH, T4TOTAL, T3FREE, THYROIDAB in the last 72 hours.  Invalid input(s): FREET3  Cardiac Enzymes No results for input(s): CKMB, TROPONINI, MYOGLOBIN in the last 168 hours.  Invalid input(s): CK ------------------------------------------------------------------------------------------------------------------    Component Value Date/Time   BNP 36.8 01/22/2019 0135    Micro Results  Recent Results (from the past 240 hour(s))  Culture, blood (routine x 2)     Status: None   Collection Time: 01/15/19  2:57 AM   Specimen: BLOOD  Result Value Ref Range Status   Specimen Description BLOOD RIGHT ANTECUBITAL  Final   Special Requests   Final    BOTTLES DRAWN AEROBIC AND ANAEROBIC Blood Culture results may not be optimal due to an excessive volume of blood received in culture bottles   Culture   Final    NO GROWTH 5  DAYS Performed at [facility], [address] [address] [place], [place] [address]    Report Status 01/20/2019 FINAL  Final  Culture, blood (routine x 2)     Status: None   Collection Time: 01/15/19  2:57 AM   Specimen: BLOOD  Result Value Ref Range Status   Specimen Description BLOOD BLOOD LEFT HAND  Final   Special Requests   Final    BOTTLES DRAWN AEROBIC AND ANAEROBIC Blood Culture results may not be optimal due to an inadequate volume of blood received in culture bottles   Culture   Final    NO GROWTH 5 DAYS Performed at [facility], [address] [place], [place] [address]    Report Status 01/20/2019 FINAL  Final  Urine culture     Status: None   Collection Time: 01/15/19  9:41 AM   Specimen: Urine, Random  Result Value Ref Range Status   Specimen Description   Final    URINE, RANDOM Performed at [facility], [address] [place], [place] [address]    Special Requests   Final    NONE Performed at [facility], [address] [place], [place] [address]    Culture   Final    NO GROWTH Performed at [facility], [place] [address] [place], [place] [address]    Report Status 01/16/2019 FINAL  Final  Culture, Urine     Status: None   Collection Time: 01/15/19  6:45 PM   Specimen: Urine, Catheterized  Result Value Ref Range Status   Specimen Description   Final    URINE, CATHETERIZED Performed at [facility] [address], [place], [place] [address]    Special Requests   Final    NONE Performed at [facility], [place] [address], [place], [place] [address]    Culture   Final    NO GROWTH Performed at [facility], [place] [address] [place], [place] [address]    Report Status 01/16/2019 FINAL  Final  Culture, respiratory     Status: None   Collection Time: 01/15/19  6:45 PM   Specimen: Tracheal Aspirate  Result Value Ref Range Status   Specimen Description   Final    TRACHEAL  ASPIRATE Performed at [facility]  [address] [place], [place] [address]    Special Requests   Final    NONE Performed at [facility], [place] [address], [place], [place] [address]    Gram Stain   Final    RARE WBC PRESENT, PREDOMINANTLY PMN ABUNDANT GRAM POSITIVE COCCI IN PAIRS IN CLUSTERS ABUNDANT GRAM NEGATIVE RODS FEW GRAM POSITIVE RODS Performed at [facility], [place] [address] [place], [place] [address]    Culture FEW STAPHYLOCOCCUS AUREUS  Final   Report Status 01/19/2019 FINAL  Final   Organism ID, Bacteria STAPHYLOCOCCUS AUREUS  Final      Susceptibility   Staphylococcus aureus - MIC*    CIPROFLOXACIN <=0.5 SENSITIVE Sensitive     ERYTHROMYCIN <=0.25 SENSITIVE Sensitive     GENTAMICIN <=0.5 SENSITIVE Sensitive     OXACILLIN 0.5 SENSITIVE Sensitive     TETRACYCLINE <=1 SENSITIVE Sensitive     VANCOMYCIN 1 SENSITIVE Sensitive     TRIMETH/SULFA <=10 SENSITIVE Sensitive     CLINDAMYCIN <=0.25 SENSITIVE Sensitive     RIFAMPIN <=0.5 SENSITIVE Sensitive     Inducible Clindamycin NEGATIVE Sensitive     * FEW STAPHYLOCOCCUS AUREUS  Culture, blood (routine x 2)     Status: None (Preliminary result)   Collection Time: 01/21/19  2:27 PM   Specimen: BLOOD  Result Value Ref Range Status   Specimen Description   Final    BLOOD RIGHT ARM Performed at [facility] [address], [place], [place] [address]    Special Requests   Final    BOTTLES DRAWN AEROBIC ONLY Blood Culture results may not be optimal due to an inadequate volume of blood received in culture bottles Performed at [facility] [address], [place], [place] [address]    Culture   Final    NO GROWTH < 24 HOURS Performed at [facility] [address], [place], [place] [address]    Report Status PENDING  Incomplete  Culture, blood (routine x 2)     Status: None (Preliminary result)   Collection Time: 01/21/19  2:31 PM    Specimen: BLOOD  Result Value Ref Range Status   Specimen Description   Final    BLOOD RIGHT HAND Performed at [facility] [address], [place], [place] [address]    Special Requests   Final    BOTTLES DRAWN AEROBIC ONLY Blood Culture adequate volume Performed at [facility] [address], [place], [place] [address]    Culture   Final    NO GROWTH < 24 HOURS Performed at [facility] [address], [place], [place] [address]    Report Status PENDING  Incomplete    Radiology Reports CT ABDOMEN PELVIS WO CONTRAST  Result Date: 01/15/2019 CLINICAL DATA:  Abdominal pain and fever. Abscess suspected. COVID-19 pneumonia. EXAM: CT CHEST, ABDOMEN AND PELVIS WITHOUT CONTRAST TECHNIQUE: Multidetector CT imaging of the chest, abdomen and pelvis was performed following the standard protocol without IV contrast. COMPARISON:  One-view chest x-ray 01/15/2019 FINDINGS: CT CHEST FINDINGS Cardiovascular: The heart size is normal. Scratched at the heart size upper limits of normal. Atherosclerotic calcifications are noted at the aortic valve and aortic arch. There is no aneurysm. Pulmonary artery size is normal. Mediastinum/Nodes: No significant mediastinal hilar, or axillary adenopathy is present. Esophagus is within normal limits. Thoracic inlet is normal. Lungs/Pleura: Bilateral lower lobe airspace consolidation is present. Additional patchy airspace disease is present in the superior segment  of both lower lobes and in the right middle lobe. Mild patchy airspace opacities are present right upper lobe as well. Musculoskeletal: Vertebral body heights are maintained. No focal lytic or blastic lesions are present. CT ABDOMEN PELVIS FINDINGS Hepatobiliary: There is diffuse fatty infiltration of the liver. No discrete lesions are present. The common bile duct and gallbladder are within normal limits. Pancreas: Mild fatty infiltration of the pancreas is present.  No discrete lesions are present. Spleen: Normal in size without focal abnormality. Adrenals/Urinary Tract: The adrenal glands are normal bilaterally. Kidneys are unremarkable. There is no stone or mass lesion. No obstruction is present. The ureters are within normal limits. The urinary bladder is decompressed with a Foley catheter in place. Stomach/Bowel: The stomach and duodenum are within limits. Small bowel is unremarkable. Terminal ileum is normal. Appendix is visualized and within limits. The ascending and transverse colon are normal. Descending and sigmoid colon are normal. Vascular/Lymphatic: Atherosclerotic changes are present in the aorta and branch vessels. Reproductive: Prostate is unremarkable. Other: No abdominal wall hernia or abnormality. No abdominopelvic ascites. Focal fluid collection present to suggest abscess Musculoskeletal: Endplate changes are present at L5-S1. Vertebral body heights are maintained. There is some straightening of lumbar lordosis. Bony pelvis is within normal limits. Hips are located and within normal limits. IMPRESSION: 1. Bilateral lower lobe and right middle lobe airspace disease compatible with multifocal pneumonia. There is likely involvement right upper lobe as well. 2. Hepatic steatosis. 3. No other acute or focal abnormality to explain the patient's abdominal pain. 4. Degenerative changes in the lower lumbar spine. 5. Aortic Atherosclerosis (ICD10-I70.0). Electronically Signed   By: San Morelle M.D.   On: 01/15/2019 16:34   CT HEAD WO CONTRAST  Result Date: 01/21/2019 CLINICAL DATA:  Focal neuro deficit greater than 6 hours. EXAM: CT HEAD WITHOUT CONTRAST TECHNIQUE: Contiguous axial images were obtained from the base of the skull through the vertex without intravenous contrast. COMPARISON:  None. FINDINGS: Brain: Age advanced cerebral atrophy, mild ventriculomegaly and mild periventricular white matter changes. The ventricles are in the midline without  mass effect or shift. They are normal in configuration. No extra-axial fluid collections are identified. No CT findings to suggest hemispheric infarction or intracranial hemorrhage. No mass lesions. The brainstem and cerebellum are grossly normal. Vascular: Age advanced vascular calcifications but no hyperdense vessels or obvious aneurysm. Skull: No skull fracture or bone lesion. Sinuses/Orbits: The paranasal sinuses and mastoid air cells are clear. The globes are intact. Other: Scattered scalp calcifications.  No lesions or hematoma. IMPRESSION: 1. Age advanced cerebral atrophy, ventriculomegaly and periventricular white matter disease. 2. No acute intracranial findings or mass lesions. Electronically Signed   By: Marijo Sanes M.D.   On: 01/21/2019 11:04   CT SOFT TISSUE NECK WO CONTRAST  Result Date: 01/15/2019 CLINICAL DATA:  COVID-19 positive.  Sepsis.  Renal insufficiency EXAM: CT NECK WITHOUT CONTRAST TECHNIQUE: Multidetector CT imaging of the neck was performed following the standard protocol without intravenous contrast. COMPARISON:  None. FINDINGS: Pharynx and larynx: Image quality degraded by motion. Normal airway.  No mass or abscess in the larynx. Salivary glands: No inflammation, mass, or stone. Thyroid: Negative Lymph nodes: No enlarged lymph nodes in the neck. Vascular: Limited vascular evaluation without intravenous contrast. Limited intracranial: Negative Visualized orbits: Negative Mastoids and visualized paranasal sinuses: Paranasal sinuses clear. Small osteoma left frontal sinus. Skeleton: Cervical spondylosis.  No acute skeletal abnormality. Upper chest: Negative Other: None IMPRESSION: No acute abnormality Exam limited by motion and lack  of intravenous contrast. Electronically Signed   By: Franchot Gallo M.D.   On: 01/15/2019 16:34   CT CHEST WO CONTRAST  Result Date: 01/15/2019 CLINICAL DATA:  Abdominal pain and fever. Abscess suspected. COVID-19 pneumonia. EXAM: CT CHEST, ABDOMEN AND  PELVIS WITHOUT CONTRAST TECHNIQUE: Multidetector CT imaging of the chest, abdomen and pelvis was performed following the standard protocol without IV contrast. COMPARISON:  One-view chest x-ray 01/15/2019 FINDINGS: CT CHEST FINDINGS Cardiovascular: The heart size is normal. Scratched at the heart size upper limits of normal. Atherosclerotic calcifications are noted at the aortic valve and aortic arch. There is no aneurysm. Pulmonary artery size is normal. Mediastinum/Nodes: No significant mediastinal hilar, or axillary adenopathy is present. Esophagus is within normal limits. Thoracic inlet is normal. Lungs/Pleura: Bilateral lower lobe airspace consolidation is present. Additional patchy airspace disease is present in the superior segment of both lower lobes and in the right middle lobe. Mild patchy airspace opacities are present right upper lobe as well. Musculoskeletal: Vertebral body heights are maintained. No focal lytic or blastic lesions are present. CT ABDOMEN PELVIS FINDINGS Hepatobiliary: There is diffuse fatty infiltration of the liver. No discrete lesions are present. The common bile duct and gallbladder are within normal limits. Pancreas: Mild fatty infiltration of the pancreas is present. No discrete lesions are present. Spleen: Normal in size without focal abnormality. Adrenals/Urinary Tract: The adrenal glands are normal bilaterally. Kidneys are unremarkable. There is no stone or mass lesion. No obstruction is present. The ureters are within normal limits. The urinary bladder is decompressed with a Foley catheter in place. Stomach/Bowel: The stomach and duodenum are within limits. Small bowel is unremarkable. Terminal ileum is normal. Appendix is visualized and within limits. The ascending and transverse colon are normal. Descending and sigmoid colon are normal. Vascular/Lymphatic: Atherosclerotic changes are present in the aorta and branch vessels. Reproductive: Prostate is unremarkable. Other: No  abdominal wall hernia or abnormality. No abdominopelvic ascites. Focal fluid collection present to suggest abscess Musculoskeletal: Endplate changes are present at L5-S1. Vertebral body heights are maintained. There is some straightening of lumbar lordosis. Bony pelvis is within normal limits. Hips are located and within normal limits. IMPRESSION: 1. Bilateral lower lobe and right middle lobe airspace disease compatible with multifocal pneumonia. There is likely involvement right upper lobe as well. 2. Hepatic steatosis. 3. No other acute or focal abnormality to explain the patient's abdominal pain. 4. Degenerative changes in the lower lumbar spine. 5. Aortic Atherosclerosis (ICD10-I70.0). Electronically Signed   By: San Morelle M.D.   On: 01/15/2019 16:34   CT LUMBAR SPINE WO CONTRAST  Result Date: 01/17/2019 CLINICAL DATA:  Right lumbosacral pain radiating to the leg. Low back pain with increased fracture risk EXAM: CT LUMBAR SPINE WITHOUT CONTRAST TECHNIQUE: Multidetector CT imaging of the lumbar spine was performed without intravenous contrast administration. Multiplanar CT image reconstructions were also generated. COMPARISON:  None. FINDINGS: Segmentation: 5 lumbar type vertebrae Alignment: Normal Vertebrae: No evidence of fracture, discitis, or aggressive bone lesion Paraspinal and other soft tissues: Bilateral pneumonia in this patient with known COVID-19. Disc levels: Disc narrowing and endplate degeneration at L4-5. Mild facet spurring at L4-5 and below IMPRESSION: 1. No acute finding in the lumbar spine. 2. L4-5 moderate disc degeneration. Electronically Signed   By: Monte Fantasia M.D.   On: 01/17/2019 09:40   CT PELVIS WO CONTRAST  Result Date: 01/17/2019 CLINICAL DATA:  Pelvic fracture. EXAM: CT PELVIS WITHOUT CONTRAST TECHNIQUE: Multidetector CT imaging of the pelvis was performed following  the standard protocol without intravenous contrast. COMPARISON:  January 15, 2019. FINDINGS:  Urinary Tract: Urinary bladder is decompressed secondary to Foley catheter. Bowel:  Unremarkable visualized pelvic bowel loops. Vascular/Lymphatic: Atherosclerosis of visualized abdominal aorta and iliac arteries is noted. No adenopathy is noted. Reproductive:  No mass or other significant abnormality Other:  None. Musculoskeletal: No suspicious bone lesions identified. IMPRESSION: No definite evidence of pelvic fracture or other significant bony abnormality. Aortic Atherosclerosis (ICD10-I70.0). Electronically Signed   By: Marijo Conception M.D.   On: 01/17/2019 09:45   CXR am  Result Date: 01/21/2019 CLINICAL DATA:  Shortness of breath EXAM: PORTABLE CHEST 1 VIEW COMPARISON:  01/15/2019 FINDINGS: Extensive bilateral airspace disease, progressed. Cardiomegaly that is chronic. No suspected pleural effusion. No pneumothorax. IMPRESSION: Extensive and progressive bilateral pneumonia. Electronically Signed   By: Monte Fantasia M.D.   On: 01/21/2019 09:53   CXR am  Result Date: 01/15/2019 CLINICAL DATA:  Acute shortness of breath. Follow-up COVID-19 pneumonia. EXAM: PORTABLE CHEST 1 VIEW 11:16 a.m.: COMPARISON:  Portable chest x-ray earlier same day at 2:55 a.m. FINDINGS: Cardiac silhouette enlarged for AP portable technique, unchanged. Progressive airspace consolidation involving the lower lobes and the RIGHT MIDDLE LOBE since the examination earlier this morning. Streaky opacities in the lingula. UPPER lobes remain clear otherwise. Normal pulmonary vascularity. IMPRESSION: 1. Progressive bilateral lower lobe pneumonia since the examination earlier this morning. 2. Streaky opacities in the lingula, likely atelectasis. 3. Stable cardiomegaly without pulmonary edema. Electronically Signed   By: Evangeline Dakin M.D.   On: 01/15/2019 11:26   DG Chest Port 1 View  Result Date: 01/15/2019 CLINICAL DATA:  Shortness of breath. EXAM: PORTABLE CHEST 1 VIEW COMPARISON:  None. FINDINGS: The heart is enlarged. Patchy  right greater than left airspace opacities in the lung bases. No pulmonary edema. No definite pleural effusion. No pneumothorax. No acute osseous abnormalities are seen. IMPRESSION: 1. Right greater than left basilar airspace opacities, suspicious for pneumonia. 2. Mild cardiomegaly. Electronically Signed   By: Keith Rake M.D.   On: 01/15/2019 03:19   ECHOCARDIOGRAM COMPLETE  Result Date: 01/16/2019   ECHOCARDIOGRAM REPORT   Patient Name:   KOLSON CHOVANEC Date of Exam: 01/16/2019 Medical Rec #:  [id number]    Height: Accession #:    [id number]   Weight:       283.1 lb Date of Birth:  Jun 11, 1955   BSA:          2.55 m Patient Age:    89 years     BP:           155/98 mmHg Patient Gender: M            HR:           105 bpm. Exam Location:  Inpatient Procedure: 2D Echo Indications:    Chest Pain 786.50 / R07.9  History:        Patient has no prior history of Echocardiogram examinations.                 Risk Factors:Hypertension and Diabetes. COVID 19.  Sonographer:    Darlina Sicilian RDCS Referring Phys: Graylin Shiver [facility]  Sonographer Comments: Echo performed with the patient sitting upright in his chair IMPRESSIONS  1. Left ventricular ejection fraction, by visual estimation, is 60 to 65%. The left ventricle has normal function. There is mildly increased left ventricular hypertrophy.  2. Left ventricular diastolic parameters are consistent with Grade I diastolic dysfunction (impaired relaxation).  3. The  left ventricle has no regional wall motion abnormalities.  4. Global right ventricle has normal systolic function.The right ventricular size is normal.  5. Left atrial size was normal.  6. Right atrial size was normal.  7. Mild mitral annular calcification.  8. The mitral valve is normal in structure. No evidence of mitral valve regurgitation. No evidence of mitral stenosis.  9. The tricuspid valve is normal in structure. Tricuspid valve regurgitation is mild. 10. The aortic valve has an indeterminant  number of cusps. Aortic valve regurgitation is not visualized. Mild to moderate aortic valve sclerosis/calcification without any evidence of aortic stenosis. 11. The pulmonic valve was not well visualized. Pulmonic valve regurgitation is not visualized. 12. Technically difficult; normal LV systolic function; grade 1 diastolic dysfunction; mild LVH with mild proximal septal thickening; calcified aortic valve (not well interrogated but no clear AS by doppler). FINDINGS  Left Ventricle: Left ventricular ejection fraction, by visual estimation, is 60 to 65%. The left ventricle has normal function. The left ventricle has no regional wall motion abnormalities. There is mildly increased left ventricular hypertrophy. Left ventricular diastolic parameters are consistent with Grade I diastolic dysfunction (impaired relaxation). Normal left atrial pressure. Right Ventricle: The right ventricular size is normal.Global RV systolic function is has normal systolic function. Left Atrium: Left atrial size was normal in size. Right Atrium: Right atrial size was normal in size Pericardium: There is no evidence of pericardial effusion. Mitral Valve: The mitral valve is normal in structure. Mild mitral annular calcification. No evidence of mitral valve regurgitation. No evidence of mitral valve stenosis by observation. Tricuspid Valve: The tricuspid valve is normal in structure. Tricuspid valve regurgitation is mild. Aortic Valve: The aortic valve has an indeterminant number of cusps. Aortic valve regurgitation is not visualized. Mild to moderate aortic valve sclerosis/calcification is present, without any evidence of aortic stenosis. Pulmonic Valve: The pulmonic valve was not well visualized. Pulmonic valve regurgitation is not visualized. Pulmonic regurgitation is not visualized. Aorta: The aortic root is normal in size and structure. Venous: The inferior vena cava was not well visualized.  Additional Comments: Technically difficult;  normal LV systolic function; grade 1 diastolic dysfunction; mild LVH with mild proximal septal thickening; calcified aortic valve (not well interrogated but no clear AS by doppler).  LEFT VENTRICLE PLAX 2D LVIDd:         3.78 cm  Diastology LVIDs:         3.04 cm  LV e' lateral:   10.60 cm/s LV PW:         0.90 cm  LV E/e' lateral: 9.0 LV IVS:        1.60 cm  LV e' medial:    6.96 cm/s LVOT diam:     1.90 cm  LV E/e' medial:  13.7 LV SV:         25 ml LVOT Area:     2.84 cm  LEFT ATRIUM             Index LA diam:        3.50 cm 1.37 cm/m LA Vol (A2C):   64.9 ml 25.50 ml/m LA Vol (A4C):   37.7 ml 14.81 ml/m LA Biplane Vol: 54.7 ml 21.49 ml/m  AORTIC VALVE LVOT Vmax:   99.20 cm/s LVOT Vmean:  59.300 cm/s LVOT VTI:    0.156 m  AORTA Ao Root diam: 3.10 cm MITRAL VALVE  TRICUSPID VALVE MV Area (PHT): 7.09 cm              TR Peak grad:   35.0 mmHg MV PHT:        31.03 msec            TR Vmax:        296.00 cm/s MV Decel Time: 107 msec MV E velocity: 95.50 cm/s  103 cm/s  SHUNTS MV A velocity: 120.00 cm/s 70.3 cm/s Systemic VTI:  0.16 m MV E/A ratio:  0.80        1.5       Systemic Diam: 1.90 cm  Kirk Ruths MD Electronically signed by Kirk Ruths MD Signature Date/Time: 01/16/2019/1:35:47 PM    Final

## 2019-01-22 NOTE — Progress Notes (Signed)
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 PMH includes HTN, DM.    PT Comments    Pt making progress with mobility this am, pt at [facility] I with bed mob and transfers, ambulated approx 5ft in room with SBA-min guard assist and hand held assist. On therapist arrival to room pot on 15L/min via HFNC and sats in high 90s, completed supine to sit and was sitting unsupported EOB maintaining sats in high 90s, transferred to recliner with set up and SBA and again sats remained in high 90s. Pt with very flat affect and minimally receptive to education especially on incentive spirometer and flutter valve use. Pt also has great difficulty with IS use minimally been able to pull . Physician and nurse in room at time and pt titrated down to 6L/min via HFNC, at rest sats remained in mid to high 90s, with ambulation in room approx 27ft, initially with SBA but w/ increased distance gait w/ increased staggering and w/ decreased balance needing hand held assist to complete, pt desat to min 89% with ambulation. Once seated with cued for pursed lip breathing was able to recover and recover saturations to low 90s. Will continue to follow and progress as pt tolerates.     Follow Up Recommendations  Home health PT;Supervision for mobility/OOB     Equipment Recommendations  Rolling walker with 5" wheels    Recommendations for Other Services       Precautions / Restrictions Precautions Precautions: Fall Restrictions Weight Bearing Restrictions: No    Mobility  Bed Mobility Overal bed mobility: Needs Assistance Bed Mobility: Supine to Sit     Supine to sit: Supervision     General bed mobility comments: needs line  management assist and cues  Transfers Overall transfer level: Needs assistance Equipment used: None Transfers: Sit to/from Stand;Stand Pivot Transfers Sit to Stand: Supervision Stand pivot transfers: Supervision          Ambulation/Gait Ambulation/Gait assistance: Min guard Gait Distance (Feet): 40 Feet Assistive device: None;1 person hand held assist Gait Pattern/deviations: Step-through pattern;Decreased stride length;Trunk flexed     General Gait Details: slow candenced some staggering noted esp with turns   Optometrist    Modified Rankin (Stroke Patients Only)       Balance Overall balance assessment: Needs assistance Sitting-balance support: Feet supported Sitting balance-Leahy Scale: Good Sitting balance - Comments: sits unsupported EOB no LOBs   Standing balance support: During functional activity;Single extremity supported Standing balance-Leahy Scale: Fair                              Cognition Arousal/Alertness: Awake/alert Behavior During Therapy: Flat affect Overall Cognitive Status: No family/caregiver present to determine baseline cognitive functioning(seems to be close to baseline)                                        Exercises Other Exercises Other Exercises: still encouraging to use IS and flutter valve    General Comments General comments (  skin integrity, edema, etc.): more receptive to working with IS but still very stand offish with education and cues for use, also had difficulty pulling 546ml       Pertinent Vitals/Pain Pain Assessment: Faces Faces Pain Scale: Hurts a little bit Pain Location: w/ increased work of breathing Pain Intervention(s): Limited activity within patient's tolerance    Home Living                      Prior Function            PT Goals (current goals can now be found in the care plan section) Acute Rehab PT Goals Patient Stated  Goal: pt not very talkertive Time For Goal Achievement: 01/30/19 Potential to Achieve Goals: Good Progress towards PT goals: Progressing toward goals    Frequency    Min 3X/week      PT Plan Current plan remains appropriate    Co-evaluation              AM-PAC PT "6 Clicks" Mobility   Outcome Measure  Help needed turning from your back to your side while in a flat bed without using bedrails?: None Help needed moving from lying on your back to sitting on the side of a flat bed without using bedrails?: A Little Help needed moving to and from a bed to a chair (including a wheelchair)?: A Little Help needed standing up from a chair using your arms (e.g., wheelchair or bedside chair)?: A Little Help needed to walk in hospital room?: A Little Help needed climbing 3-5 steps with a railing? : A Lot 6 Click Score: 18    End of Session Equipment Utilized During Treatment: Oxygen Activity Tolerance: Patient limited by fatigue;Patient limited by lethargy Patient left: in chair;with call bell/phone within reach Nurse Communication: Mobility status PT Visit Diagnosis: Other abnormalities of gait and mobility (R26.89)     Time: 6283-1517 PT Time Calculation (min) (ACUTE ONLY): 44 min  Charges:  $Gait Training: 8-22 mins $Therapeutic Activity: 23-37 mins                     Horald Chestnut, PT    Delford Field 01/22/2019, 1:05 PM

## 2019-01-22 NOTE — Progress Notes (Signed)
   Vital Signs MEWS/VS Documentation      01/22/2019 0300 01/22/2019 0500 01/22/2019 0743 01/22/2019 0800   MEWS Score:  0  0  2  2   MEWS Score Color:  Green  Green  Yellow  Yellow   Resp:  18  16  (!) 26  --   Pulse:  92  86  91  --   BP:  133/82  136/83  (!) 142/87  --   Temp:  98.9 F (37.2 C)  99.3 F (37.4 C)  98.6 F (37 C)  --   O2 Device:  --  Non-rebreather Mask;HFNC  HFNC;Non-rebreather Mask  --   O2 Flow Rate (L/min):  --  --  15 L/min  --   Level of Consciousness:  Alert  --  --  Alert     Patient's respiration rate is 26, no PRN's ordered, [name]. Will follow yellow mews protocol       Austin Shannon 01/22/2019,8:33 AM

## 2019-01-22 NOTE — Progress Notes (Signed)
Family Update:  Spoke with wife, updated on plan of care. Expected discharge in 2-3 days.

## 2019-01-23 ENCOUNTER — Inpatient Hospital Stay (HOSPITAL_COMMUNITY): Payer: HRSA Program

## 2019-01-23 LAB — COMPREHENSIVE METABOLIC PANEL
ALT: 43 U/L (ref 0–44)
AST: 34 U/L (ref 15–41)
Albumin: 2.4 g/dL — ABNORMAL LOW (ref 3.5–5.0)
Alkaline Phosphatase: 60 U/L (ref 38–126)
Anion gap: 12 (ref 5–15)
BUN: 29 mg/dL — ABNORMAL HIGH (ref 8–23)
CO2: 30 mmol/L (ref 22–32)
Calcium: 7.8 mg/dL — ABNORMAL LOW (ref 8.9–10.3)
Chloride: 95 mmol/L — ABNORMAL LOW (ref 98–111)
Creatinine, Ser: 1.36 mg/dL — ABNORMAL HIGH (ref 0.61–1.24)
GFR calc Af Amer: >60 mL/min (ref >60)
GFR calc non Af Amer: 55 mL/min — ABNORMAL LOW (ref >60)
Glucose, Bld: 185 mg/dL — ABNORMAL HIGH (ref 70–99)
Potassium: 4.6 mmol/L (ref 3.5–5.1)
Sodium: 137 mmol/L (ref 135–145)
Total Bilirubin: 0.4 mg/dL (ref 0.3–1.2)
Total Protein: 7 g/dL (ref 6.5–8.1)

## 2019-01-23 LAB — GLUCOSE, CAPILLARY
Glucose-Capillary: 139 mg/dL — ABNORMAL HIGH (ref 70–99)
Glucose-Capillary: 159 mg/dL — ABNORMAL HIGH (ref 70–99)
Glucose-Capillary: 168 mg/dL — ABNORMAL HIGH (ref 70–99)
Glucose-Capillary: 212 mg/dL — ABNORMAL HIGH (ref 70–99)
Glucose-Capillary: 227 mg/dL — ABNORMAL HIGH (ref 70–99)
Glucose-Capillary: 280 mg/dL — ABNORMAL HIGH (ref 70–99)
Glucose-Capillary: 293 mg/dL — ABNORMAL HIGH (ref 70–99)
Glucose-Capillary: 330 mg/dL — ABNORMAL HIGH (ref 70–99)
Glucose-Capillary: 336 mg/dL — ABNORMAL HIGH (ref 70–99)

## 2019-01-23 LAB — CBC WITH DIFFERENTIAL/PLATELET
Abs Immature Granulocytes: 0.82 10*3/uL — ABNORMAL HIGH (ref 0.00–0.07)
Basophils Absolute: 0.1 10*3/uL (ref 0.0–0.1)
Basophils Relative: 0 %
Eosinophils Absolute: 0 10*3/uL (ref 0.0–0.5)
Eosinophils Relative: 0 %
HCT: 40.5 % (ref 39.0–52.0)
Hemoglobin: 12.2 g/dL — ABNORMAL LOW (ref 13.0–17.0)
Immature Granulocytes: 4 %
Lymphocytes Relative: 7 %
Lymphs Abs: 1.4 10*3/uL (ref 0.7–4.0)
MCH: 24.2 pg — ABNORMAL LOW (ref 26.0–34.0)
MCHC: 30.1 g/dL (ref 30.0–36.0)
MCV: 80.2 fL (ref 80.0–100.0)
Monocytes Absolute: 0.9 10*3/uL (ref 0.1–1.0)
Monocytes Relative: 5 %
Neutro Abs: 16.7 10*3/uL — ABNORMAL HIGH (ref 1.7–7.7)
Neutrophils Relative %: 84 %
Platelets: 483 10*3/uL — ABNORMAL HIGH (ref 150–400)
RBC: 5.05 MIL/uL (ref 4.22–5.81)
RDW: 15.2 % (ref 11.5–15.5)
WBC: 19.8 10*3/uL — ABNORMAL HIGH (ref 4.0–10.5)
nRBC: 0 % (ref 0.0–0.2)

## 2019-01-23 LAB — D-DIMER, QUANTITATIVE: D-Dimer, Quant: 0.93 ug/mL-FEU — ABNORMAL HIGH (ref 0.00–0.50)

## 2019-01-23 LAB — PROCALCITONIN: Procalcitonin: 1.49 ng/mL

## 2019-01-23 LAB — C-REACTIVE PROTEIN: CRP: 12.7 mg/dL — ABNORMAL HIGH (ref <1.0)

## 2019-01-23 LAB — BRAIN NATRIURETIC PEPTIDE: B Natriuretic Peptide: 28.9 pg/mL (ref 0.0–100.0)

## 2019-01-23 MED ORDER — LORAZEPAM 2 MG/ML IJ SOLN
1.0000 mg | Freq: Once | INTRAMUSCULAR | Status: DC
  Filled 2019-01-23: qty 0.5

## 2019-01-23 NOTE — Progress Notes (Signed)
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 PMH includes HTN, DM. Pt had several MRI's on 01/23/2019. MRI Lumbar Spine: Chronic degenerative disc disease at L4-5. Mild narrowing of the lateral recesses and foramina but no visible neural compression. L5-S1: Mild facet osteoarthritis.  No stenosis. No finding to suggest acute regional infection. MRI Thoracic spine: Negative study of the thoracic spine. No abnormality seen to explain pain. No sign of spinal infection. MRI Cervical Spine: No acute finding. No cord compression or cord lesion. No finding attributable to coronavirus infection. MRI Brain: No acute or reversible finding. Atrophy and chronic small-vessel ischemic changes affecting the brain. No finding attributable to coronavirus infection.    OT comments  Pt making slow progress in therapy. Pt able to ambulate to/from bathroom and complete toileting task. Pt required min to mod cues throughout on safety during tasks. Noted several instances of loss of balance during ambulation. Educated pt on activity pacing techniques to increase balance and safety with fair understanding and follow through. Pt currently on 15L HFNC with SpO2 maintaining in 90s throughout. 2/4 DOE. Recommend continued OT services. Pt may require SNF placement, depending on progress in therapy.   Follow Up Recommendations  Home health OT;Supervision/Assistance - 24 hour    Equipment Recommendations  3 in 1 bedside commode    Recommendations for Other Services      Precautions / Restrictions Precautions Precautions: Fall;Other (comment) Precaution Comments: 15L HFNC Restrictions Weight Bearing Restrictions: No       Mobility  Bed Mobility               General bed mobility comments: Pt sitting in bedside chair upon arrival  Transfers Overall transfer level: Needs assistance Equipment used: Rolling walker (2 wheeled) Transfers: Sit to/from Stand Sit to Stand: Min guard              Balance Overall balance assessment: Needs assistance   Sitting balance-Leahy Scale: Good       Standing balance-Leahy Scale: Poor                             ADL either performed or assessed with clinical judgement   ADL Overall ADL's : Needs assistance/impaired                         Toilet Transfer: Min guard;Ambulation;Regular Museum/gallery exhibitions officer and Hygiene: Min guard;Sit to/from stand       Functional mobility during ADLs: Min guard;Minimal assistance;Rolling walker General ADL Comments: Pt able to ambulate to/from bathroom with RW. Noted several instances of LOB with pt requiring min assist to self-correct     Vision       Perception     Praxis      Cognition Arousal/Alertness: Awake/alert Behavior During Therapy: Flat affect Overall Cognitive Status: No family/caregiver present to determine baseline cognitive functioning Area of Impairment: Safety/judgement;Problem solving                         Safety/Judgement: Decreased awareness of safety;Decreased awareness of deficits   Problem Solving: Slow processing  Exercises Exercises: Other exercises Other Exercises Other Exercises: Flutter valve x 10   Shoulder Instructions       General Comments Pt on 15L HFNC with SpO2 maintaining in low 90s throughout. 2/4 DOE.     Pertinent Vitals/ Pain       Pain Assessment: No/denies pain  Home Living                                          Prior Functioning/Environment              Frequency           Progress Toward Goals  OT Goals(current goals can now be found in the care plan  section)  Progress towards OT goals: Progressing toward goals  ADL Goals Pt Will Perform Grooming: with supervision;standing Pt Will Perform Upper Body Bathing: with modified independence;sitting Pt Will Perform Lower Body Bathing: with set-up;sitting/lateral leans;with adaptive equipment Pt Will Transfer to Toilet: with supervision;ambulating Pt Will Perform Toileting - Clothing Manipulation and hygiene: with supervision;sit to/from stand Pt/caregiver will Perform Home Exercise Program: Both right and left upper extremity;With theraband;With written HEP provided Additional ADL Goal #1: Pt will recall 3 ways of conserving energy during ADL routine with no cues  Plan Discharge plan remains appropriate(Depending on progress, pt may require SNF placement)    Co-evaluation                 AM-PAC OT "6 Clicks" Daily Activity     Outcome Measure   Help from another person eating meals?: None Help from another person taking care of personal grooming?: A Little Help from another person toileting, which includes using toliet, bedpan, or urinal?: A Little Help from another person bathing (including washing, rinsing, drying)?: A Lot Help from another person to put on and taking off regular upper body clothing?: A Little Help from another person to put on and taking off regular lower body clothing?: A Lot 6 Click Score: 17    End of Session Equipment Utilized During Treatment: Oxygen;Rolling walker  OT Visit Diagnosis: Unsteadiness on feet (R26.81);Muscle weakness (generalized) (M62.81)   Activity Tolerance Patient limited by fatigue;Other (comment)(Pt reporting numbness in right thigh. MD updated)   Patient Left in chair;with call bell/phone within reach   Nurse Communication Mobility status        Time: 1610-9604 OT Time Calculation (min): 30 min  Charges: OT General Charges $OT Visit: 1 Visit OT Treatments $Self Care/Home Management : 23-37 mins  Peterson Ao  OTR/L [phone]    Peterson Ao 01/23/2019, 3:24 PM

## 2019-01-23 NOTE — Progress Notes (Signed)
PROGRESS NOTE                                                                                                                                                                                                             Patient Demographics:    Austin Shannon, is a 63 y.o. male, DOB - [birth year], QQP:[id number]  Outpatient Primary MD for the patient is Patient, No Pcp Per    LOS - 8  Admit date - 01/15/2019    CC - AMS     Brief Narrative -  Austin Shannon  is a 63 y.o. 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   Subjective:   Overnight, patient currently feels somewhat improved denies chest pain, headache, fevers, chills.  Patient does report issues with constipation over the past 48 hours.   Assessment  & Plan :      Acute hypoxic respiratory failure 2/2 sepsis, COVID-19 pneumonia, with concurrent bacterial infection(likely aspiration pneumonia). Extremely high procalcitonin in the range of 100, elevated lactic acid, elevated creatinine and high CRP.  He also has history of treated TB in the past.  Initially placed on IV remdesivir, IV steroids, IV empiric antibiotics (Unasyn and doxycycline).  Clinically he has improved, noncontrast CT neck, chest, abdomen and pelvis unremarkable.  He has been cleared by speech - tolerating PO well at this point Continue Unasyn(DC 18th) and doxycycline (DC 12/23) for full 10-day course Continue to monitor blood cultures.  Respiratory  cultures growing few colonies of staph aureus and he is already on doxycycline.  SpO2: (!) 88 % O2 Flow Rate (L/min): 8 L/min  Recent Labs  Lab 01/17/19 0345 01/18/19 0357 01/19/19 0609 01/19/19 0610 01/21/19 0440 01/22/19 0135 01/23/19 0305  CRP 21.3* 11.9*  --  11.3* 19.9* 22.5* 12.7*  DDIMER 3.33* 1.93* 1.77*  --  1.42* 1.20* 0.93*  BNP 82.0 134.1* 167.1*  --  39.6 36.8 28.9  PROCALCITON 53.75 27.77  --   --  4.00 2.65 1.49    Hepatic Function  Latest Ref Rng & Units 01/23/2019 01/22/2019 01/21/2019  Total Protein 6.5 - 8.1 g/dL 7.0 7.4 7.6  Albumin 3.5 - 5.0 g/dL 2.4(L) 2.7(L) 3.0(L)  AST 15 - 41 U/L 34 34 30  ALT 0 - 44 U/L 43 43 41  Alk Phosphatase 38 - 126 U/L 60 70 69  Total Bilirubin 0.3 - 1.2 mg/dL 0.4 0.2(L) 0.5    Acute metabolic encephalopathy, recurrent febrile illness Concern for Covid encephalopathy MRI of the head, neck, thoracic and lumbar spine unremarkable as below Discussed with neurology, if patient continues to have mental status changes and fevers would consider LP however at this time given resolution and improvement will hold off on further testing  Dehydration, rhabdomyolysis with AKI and hyperkalemia, resolving.  Continue Foley for now, CT scan nonacute, improving with hydration and Kayexalate, CK has improved. IV fluids stopped, tolerating PO well.  No baseline creatinine in system patient is from [place]. Lab Results  Component Value Date   CREATININE 1.36 (H) 01/23/2019   CREATININE 1.54 (H) 01/22/2019   CREATININE 1.50 (H) 01/21/2019    Elevated liver enzymes.  Due to COVID-19 along with dehydration/rhabdomyolysis, completely resolved.  Dyslipidemia. Resume statin.  DM type II.  Reduced Lantus, only sliding scale.   Poor outpatient glycemic control due to hyperglycemia.   Diabetic and insulin education ordered.  Dose adjusted 12/14.  CBG (last 3)  Recent Labs    01/23/19 0349 01/23/19 0802 01/23/19 1353  GLUCAP 212* 168*  227*   Lab Results  Component Value Date   HGBA1C 8.2 (H) 01/15/2019    Toxic encephalopathy.  Improving with supportive care no focal deficits.  Borderline elevated troponin.  Not an ACS pattern, chest pain-free, EKG nonacute, aspirin, beta-blocker, stable echo.  Hypertension.  Stable on Coreg and Norvasc.  Dysphagia and odynophagia. Soft tissue neck CT unremarkable, non-contrast due to renal failure, supportive care with antibiotics, throat discomfort has resolved. Speech following and currently on regular diet per speech.  Right-sided lumbosacral discomfort.  Most likely musculoskeletal due to his recent fall at home, CT L-spine and pelvis unremarkable and nonacute.  Supportive care with NSAID cream, much improved.  Intermittent right leg weakness, minimal right arm weakness.  Initially thought to be due to lumbosacral discomfort, worse on 01/21/2019, head CT unremarkable, currently on aspirin and statin.  Neurology has been consulted - holding off on imaging at this time given resolution of symptoms  Consider MRI of the brain/spine if acutely worsens/changes clinically/or recurrent fever.  Febrile event x1 on 01/21/2019.   He was initially febrile when he came in, this resolved after he was started on Covid treatment along with antibiotics for aspiration Concern for intermittent aspirating while supine and eating Will continue Unasyn and continue doxycycline as above.   Repeat blood cultures ending, currently unremarkable Speech continues to evaluate with no overt signs or symptoms of aspiration Neuro following as above; consider MRI of the brain/spine if recurrent fever worsening pain or deficits as above Sputum cultures grew few staph aureus for which she is on doxycycline.  Procalcitonin trending down.   Mild acute on chronic diastolic CHF EF 29% on echocardiogram.   Continue beta-blocker. Adequate urine output over the past 24 hours after single dose of  Lasix Continue to follow I's and O's   Condition - Guarded Family Communication :  Wife updated by husband while in the room Code Status :  DNR Diet :  Diet Order  Diet Carb Modified Fluid consistency: Thin; Room service appropriate? Yes  Diet effective now              Disposition Plan:  Inpatient -likely discharge in the next 40 to 72 hours pending clinical course, infection, hypoxia.  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 There was discussion with family in reference to driving here from [place] to pick up the patient versus the patient renting a car to drive to [place] himself. Consults  : Neurology Dr. Lorraine Lax Procedures  :     MRI Head, C,T, and L spine unremarkable as below  CT soft tissue neck, chest abdomen pelvis.  All noncontrast due to AKI.  Nonacute except for bilateral pneumonia.  CT L-spine and pelvis.  Nonacute.  Echocardiogram:  1. Left ventricular ejection fraction, by visual estimation, is 60 to 65%. The left ventricle has normal function. There is mildly increased left ventricular hypertrophy.  2. Left ventricular diastolic parameters are consistent with Grade I diastolic dysfunction (impaired relaxation).  3. The left ventricle has no regional wall motion abnormalities.  4. Global right ventricle has normal systolic function.The right ventricular size is normal.  5. Left atrial size was normal.  6. Right atrial size was normal.  7. Mild mitral annular calcification.  8. The mitral valve is normal in structure. No evidence of mitral valve regurgitation. No evidence of mitral stenosis.  9. The tricuspid valve is normal in structure. Tricuspid valve regurgitation is mild. 10. The aortic valve has an indeterminant number of cusps. Aortic valve regurgitation is not visualized. Mild to moderate aortic valve sclerosis/calcification without any evidence of  aortic stenosis. 11. The pulmonic valve was not well visualized. Pulmonic valve regurgitation is not visualized. 12. Technically difficult; normal LV systolic function; grade 1 diastolic dysfunction; mild LVH with mild proximal septal thickening; calcified aortic valve (not well interrogated but no clear AS by doppler).   PUD Prophylaxis : PPI  DVT Prophylaxis:  Switch to high-dose prophylactic Lovenox on 01/16/2019  Lab Results  Component Value Date   PLT 483 (H) 01/23/2019    Inpatient Medications  Scheduled Meds: . amLODipine  10 mg Oral Daily  . aspirin EC  325 mg Oral Daily  . atorvastatin  80 mg Oral q1800  . carvedilol  6.25 mg Oral BID  . Chlorhexidine Gluconate Cloth  6 each Topical Daily  . diclofenac Sodium  2 g Topical QID  . docusate sodium  100 mg Oral BID  . doxycycline  100 mg Oral Q12H  . enoxaparin (LOVENOX) injection  65 mg Subcutaneous Q24H  . insulin aspart  0-20 Units Subcutaneous Q4H  . insulin aspart  3 Units Subcutaneous TID WC  . insulin glargine  25 Units Subcutaneous BID  . insulin starter kit- syringes  1 kit Other Once  . LORazepam  1 mg Intravenous Once  . methylPREDNISolone (SOLU-MEDROL) injection  40 mg Intravenous Q12H  . pantoprazole  40 mg Oral BID   Continuous Infusions: . ampicillin-sulbactam (UNASYN) IV 3 g (01/23/19 1433)   PRN Meds:.acetaminophen, acetaminophen, menthol-cetylpyridinium, metoprolol tartrate, [DISCONTINUED] ondansetron **OR** ondansetron (ZOFRAN) IV, senna, traMADol  Antibiotics  :    Anti-infectives (From admission, onward)   Start     Dose/Rate Route Frequency Ordered Stop   01/21/19 1800  Ampicillin-Sulbactam (UNASYN) 3 g in sodium chloride 0.9 % 100 mL IVPB     3 g 200 mL/hr over 30  Minutes Intravenous Every 6 hours 01/21/19 1325     01/20/19 2200  doxycycline (VIBRA-TABS) tablet 100 mg     100 mg Oral Every 12 hours 01/20/19 1827     01/16/19 1000  remdesivir 100 mg in sodium chloride 0.9 % 100 mL IVPB   Status:  Discontinued     100 mg 200 mL/hr over 30 Minutes Intravenous Daily 01/15/19 1104 01/15/19 1107   01/16/19 1000  remdesivir 100 mg in sodium chloride 0.9 % 100 mL IVPB     100 mg 200 mL/hr over 30 Minutes Intravenous Daily 01/15/19 1114 01/19/19 1100   01/15/19 1200  remdesivir 200 mg in sodium chloride 0.9% 250 mL IVPB  Status:  Discontinued     200 mg 580 mL/hr over 30 Minutes Intravenous Once 01/15/19 1104 01/15/19 1107   01/15/19 1200  Ampicillin-Sulbactam (UNASYN) 3 g in sodium chloride 0.9 % 100 mL IVPB  Status:  Discontinued     3 g 200 mL/hr over 30 Minutes Intravenous Every 6 hours 01/15/19 1107 01/21/19 1325       Time Spent in minutes  30   Austin Shannon M.D on 01/23/2019 at 4:36 PM  To page go to [email or web address] - password [facility]  [facility] -  Office  [phone]  See all Orders from today for further details    Objective:   Vitals:   01/23/19 0742 01/23/19 1000 01/23/19 1429 01/23/19 1435  BP: 130/74 134/70 (!) 162/87   Pulse: 86 97  97  Resp: 15 (!) 40    Temp: 99.7 F (37.6 C)     TempSrc: Oral     SpO2: 90% (!) 88%    Weight:      Height:        Wt Readings from Last 3 Encounters:  01/15/19 128.4 kg  01/15/19 128.4 kg     Intake/Output Summary (Last 24 hours) at 01/23/2019 1636 Last data filed at 01/23/2019 0300 Gross per 24 hour  Intake 360 ml  Output 900 ml  Net -540 ml     Physical Exam  General:  Pleasantly resting in bed, No acute distress.  Somewhat flat mood/affect, alert oriented x4 HEENT:  Normocephalic atraumatic.  Sclerae nonicteric, noninjected.  Extraocular movements intact bilaterally. Neck:  Without mass or deformity.  Trachea is midline. Lungs: Coarse breath sounds bilaterally without overt rhonchi, wheeze, or rales. Heart:  Regular rate and rhythm.  Without murmurs, rubs, or gallops. Abdomen:  Soft, nontender, nondistended.  Without guarding or rebound. Extremities: Without cyanosis, clubbing,  edema, or obvious deformity. Vascular:  Dorsalis pedis and posterior tibial pulses palpable bilaterally. Skin:  Warm and dry, no erythema, no ulcerations.     Data Review:    CBC Recent Labs  Lab 01/18/19 0357 01/19/19 0609 01/20/19 1020 01/21/19 0440 01/22/19 0135 01/23/19 0305  WBC 13.1* 13.9* 13.0* 15.2* 17.2* 19.8*  HGB 11.9* 12.4* 12.2* 13.7 13.0 12.2*  HCT 39.8 40.4 38.7* 45.8 43.1 40.5  PLT 235 270 316 412* 441* 483*  MCV 80.1 79.5* 78.5* 79.8* 79.7* 80.2  MCH 23.9* 24.4* 24.7* 23.9* 24.0* 24.2*  MCHC 29.9* 30.7 31.5 29.9* 30.2 30.1  RDW 15.4 15.2 15.0 15.1 15.1 15.2  LYMPHSABS 0.8 1.3  --  1.8 1.2 1.4  MONOABS 0.8 1.2*  --  1.3* 1.0 0.9  EOSABS 0.0 0.0  --  0.0 0.0 0.0  BASOSABS 0.0 0.0  --  0.1 0.1 0.1    Chemistries  Recent Labs  Lab 01/17/19 0345 01/18/19  4128 01/19/19 0609 01/20/19 1020 01/21/19 0440 01/22/19 0135 01/23/19 0305  NA 142 141 141 137 140 136 137  K 4.2 4.3 3.9 4.1 4.1 5.1 4.6  CL 101 100 98 94* 94* 94* 95*  CO2 28 29 33* 33* 34* 30 30  GLUCOSE 213* 113* 51* 159* 74 352* 185*  BUN 35* 30* 26* 27* 28* 33* 29*  CREATININE 1.43* 1.32* 1.40* 1.27* 1.50* 1.54* 1.36*  CALCIUM 8.0* 7.7* 7.6* 7.7* 8.3* 8.1* 7.8*  MG 2.6* 2.8* 2.5*  --  2.4 2.5*  --   AST 65* 48* 37  --  30 34 34  ALT 40 41 36  --  41 43 43  ALKPHOS 49 55 68  --  69 70 60  BILITOT 0.3 0.6 0.9  --  0.5 0.2* 0.4   ------------------------------------------------------------------------------------------------------------------ No results for input(s): CHOL, HDL, LDLCALC, TRIG, CHOLHDL, LDLDIRECT in the last 72 hours.  Lab Results  Component Value Date   HGBA1C 8.2 (H) 01/15/2019   ------------------------------------------------------------------------------------------------------------------ No results for input(s): TSH, T4TOTAL, T3FREE, THYROIDAB in the last 72 hours.  Invalid input(s): FREET3  Cardiac Enzymes No results for input(s): CKMB, TROPONINI, MYOGLOBIN in  the last 168 hours.  Invalid input(s): CK ------------------------------------------------------------------------------------------------------------------    Component Value Date/Time   BNP 28.9 01/23/2019 0305    Micro Results  Recent Results (from the past 240 hour(s))  Culture, blood (routine x 2)     Status: None   Collection Time: 01/15/19  2:57 AM   Specimen: BLOOD  Result Value Ref Range Status   Specimen Description BLOOD RIGHT ANTECUBITAL  Final   Special Requests   Final    BOTTLES DRAWN AEROBIC AND ANAEROBIC Blood Culture results may not be optimal due to an excessive volume of blood received in culture bottles   Culture   Final    NO GROWTH 5 DAYS Performed at [facility], [place] [place], [place] [address]    Report Status 01/20/2019 FINAL  Final  Culture, blood (routine x 2)     Status: None   Collection Time: 01/15/19  2:57 AM   Specimen: BLOOD  Result Value Ref Range Status   Specimen Description BLOOD BLOOD LEFT HAND  Final   Special Requests   Final    BOTTLES DRAWN AEROBIC AND ANAEROBIC Blood Culture results may not be optimal due to an inadequate volume of blood received in culture bottles   Culture   Final    NO GROWTH 5 DAYS Performed at [facility], [address] [place], [place] [address]    Report Status 01/20/2019 FINAL  Final  Urine culture     Status: None   Collection Time: 01/15/19  9:41 AM   Specimen: Urine, Random  Result Value Ref Range Status   Specimen Description   Final    URINE, RANDOM Performed at [facility], [address], [place], [place] [address]    Special Requests   Final    NONE Performed at [facility], [address] [place], [place] [address]    Culture   Final    NO GROWTH Performed at [facility], [place] [address] [place],  [address]    Report Status 01/16/2019 FINAL  Final  Culture, Urine     Status: None   Collection Time: 01/15/19  6:45 PM    Specimen: Urine, Catheterized  Result Value Ref Range Status   Specimen Description   Final    URINE,  CATHETERIZED Performed at [place] [address] [place], [place] [address]    Special Requests   Final    NONE Performed at [facility], [place] [address] [place], [place] [address]    Culture   Final    NO GROWTH Performed at [facility], [place] [address], [place], [place] [address]    Report Status 01/16/2019 FINAL  Final  Culture, respiratory     Status: None   Collection Time: 01/15/19  6:45 PM   Specimen: Tracheal Aspirate  Result Value Ref Range Status   Specimen Description   Final    TRACHEAL ASPIRATE Performed at [place] [address], [place], [place] [address]    Special Requests   Final    NONE Performed at [facility], [place] [address] [place], [place] [address]    Gram Stain   Final    RARE WBC PRESENT, PREDOMINANTLY PMN ABUNDANT GRAM POSITIVE COCCI IN PAIRS IN CLUSTERS ABUNDANT GRAM NEGATIVE RODS FEW GRAM POSITIVE RODS Performed at [facility], [place] [address], [place], [place] [address]    Culture FEW STAPHYLOCOCCUS AUREUS  Final   Report Status 01/19/2019 FINAL  Final   Organism ID, Bacteria STAPHYLOCOCCUS AUREUS  Final      Susceptibility   Staphylococcus aureus - MIC*    CIPROFLOXACIN <=0.5 SENSITIVE Sensitive     ERYTHROMYCIN <=0.25 SENSITIVE Sensitive     GENTAMICIN <=0.5 SENSITIVE Sensitive     OXACILLIN 0.5 SENSITIVE Sensitive     TETRACYCLINE <=1 SENSITIVE Sensitive     VANCOMYCIN 1 SENSITIVE Sensitive     TRIMETH/SULFA <=10 SENSITIVE Sensitive     CLINDAMYCIN <=0.25 SENSITIVE Sensitive     RIFAMPIN <=0.5 SENSITIVE Sensitive     Inducible Clindamycin NEGATIVE Sensitive     * FEW STAPHYLOCOCCUS AUREUS  Culture, blood (routine x 2)     Status: None (Preliminary result)   Collection Time: 01/21/19  2:27 PM   Specimen: BLOOD  Result Value Ref  Range Status   Specimen Description   Final    BLOOD RIGHT ARM Performed at [place] [address], [place], [place] [address]    Special Requests   Final    BOTTLES DRAWN AEROBIC ONLY Blood Culture results may not be optimal due to an inadequate volume of blood received in culture bottles Performed at [place] [address], [place], [place] [address]    Culture   Final    NO GROWTH 2 DAYS Performed at [place] [address], [place], [place] [address]    Report Status PENDING  Incomplete  Culture, blood (routine x 2)     Status: None (Preliminary result)   Collection Time: 01/21/19  2:31 PM   Specimen: BLOOD  Result Value Ref Range Status   Specimen Description   Final    BLOOD RIGHT HAND Performed at [place] [address], [place], [place] [address]    Special Requests   Final    BOTTLES DRAWN AEROBIC ONLY Blood Culture adequate volume Performed at [place] [address] [place], [place] [address]    Culture   Final    NO GROWTH 2 DAYS Performed at [place] [address] [place] [address], [place], [place] [address]    Report Status PENDING  Incomplete    Radiology Reports CT ABDOMEN PELVIS WO CONTRAST  Result Date: 01/15/2019  CLINICAL DATA:  Abdominal pain and fever. Abscess suspected. COVID-19 pneumonia. EXAM: CT CHEST, ABDOMEN AND PELVIS WITHOUT CONTRAST TECHNIQUE: Multidetector CT imaging of the chest, abdomen and pelvis was performed following the standard protocol without IV contrast. COMPARISON:  One-view chest x-ray 01/15/2019 FINDINGS: CT CHEST FINDINGS Cardiovascular: The heart size is normal. Scratched at the heart size upper limits of normal. Atherosclerotic calcifications are noted at the aortic valve and aortic arch. There is no aneurysm. Pulmonary artery size is normal. Mediastinum/Nodes: No significant mediastinal hilar, or axillary adenopathy is present.  Esophagus is within normal limits. Thoracic inlet is normal. Lungs/Pleura: Bilateral lower lobe airspace consolidation is present. Additional patchy airspace disease is present in the superior segment of both lower lobes and in the right middle lobe. Mild patchy airspace opacities are present right upper lobe as well. Musculoskeletal: Vertebral body heights are maintained. No focal lytic or blastic lesions are present. CT ABDOMEN PELVIS FINDINGS Hepatobiliary: There is diffuse fatty infiltration of the liver. No discrete lesions are present. The common bile duct and gallbladder are within normal limits. Pancreas: Mild fatty infiltration of the pancreas is present. No discrete lesions are present. Spleen: Normal in size without focal abnormality. Adrenals/Urinary Tract: The adrenal glands are normal bilaterally. Kidneys are unremarkable. There is no stone or mass lesion. No obstruction is present. The ureters are within normal limits. The urinary bladder is decompressed with a Foley catheter in place. Stomach/Bowel: The stomach and duodenum are within limits. Small bowel is unremarkable. Terminal ileum is normal. Appendix is visualized and within limits. The ascending and transverse colon are normal. Descending and sigmoid colon are normal. Vascular/Lymphatic: Atherosclerotic changes are present in the aorta and branch vessels. Reproductive: Prostate is unremarkable. Other: No abdominal wall hernia or abnormality. No abdominopelvic ascites. Focal fluid collection present to suggest abscess Musculoskeletal: Endplate changes are present at L5-S1. Vertebral body heights are maintained. There is some straightening of lumbar lordosis. Bony pelvis is within normal limits. Hips are located and within normal limits. IMPRESSION: 1. Bilateral lower lobe and right middle lobe airspace disease compatible with multifocal pneumonia. There is likely involvement right upper lobe as well. 2. Hepatic steatosis. 3. No other acute or  focal abnormality to explain the patient's abdominal pain. 4. Degenerative changes in the lower lumbar spine. 5. Aortic Atherosclerosis (ICD10-I70.0). Electronically Signed   By: San Morelle M.D.   On: 01/15/2019 16:34   CT HEAD WO CONTRAST  Result Date: 01/21/2019 CLINICAL DATA:  Focal neuro deficit greater than 6 hours. EXAM: CT HEAD WITHOUT CONTRAST TECHNIQUE: Contiguous axial images were obtained from the base of the skull through the vertex without intravenous contrast. COMPARISON:  None. FINDINGS: Brain: Age advanced cerebral atrophy, mild ventriculomegaly and mild periventricular white matter changes. The ventricles are in the midline without mass effect or shift. They are normal in configuration. No extra-axial fluid collections are identified. No CT findings to suggest hemispheric infarction or intracranial hemorrhage. No mass lesions. The brainstem and cerebellum are grossly normal. Vascular: Age advanced vascular calcifications but no hyperdense vessels or obvious aneurysm. Skull: No skull fracture or bone lesion. Sinuses/Orbits: The paranasal sinuses and mastoid air cells are clear. The globes are intact. Other: Scattered scalp calcifications.  No lesions or hematoma. IMPRESSION: 1. Age advanced cerebral atrophy, ventriculomegaly and periventricular white matter disease. 2. No acute intracranial findings or mass lesions. Electronically Signed   By: Marijo Sanes M.D.   On: 01/21/2019 11:04   CT SOFT TISSUE NECK WO CONTRAST  Result Date: 01/15/2019  CLINICAL DATA:  COVID-19 positive.  Sepsis.  Renal insufficiency EXAM: CT NECK WITHOUT CONTRAST TECHNIQUE: Multidetector CT imaging of the neck was performed following the standard protocol without intravenous contrast. COMPARISON:  None. FINDINGS: Pharynx and larynx: Image quality degraded by motion. Normal airway.  No mass or abscess in the larynx. Salivary glands: No inflammation, mass, or stone. Thyroid: Negative Lymph nodes: No enlarged  lymph nodes in the neck. Vascular: Limited vascular evaluation without intravenous contrast. Limited intracranial: Negative Visualized orbits: Negative Mastoids and visualized paranasal sinuses: Paranasal sinuses clear. Small osteoma left frontal sinus. Skeleton: Cervical spondylosis.  No acute skeletal abnormality. Upper chest: Negative Other: None IMPRESSION: No acute abnormality Exam limited by motion and lack of intravenous contrast. Electronically Signed   By: Franchot Gallo M.D.   On: 01/15/2019 16:34   CT CHEST WO CONTRAST  Result Date: 01/15/2019 CLINICAL DATA:  Abdominal pain and fever. Abscess suspected. COVID-19 pneumonia. EXAM: CT CHEST, ABDOMEN AND PELVIS WITHOUT CONTRAST TECHNIQUE: Multidetector CT imaging of the chest, abdomen and pelvis was performed following the standard protocol without IV contrast. COMPARISON:  One-view chest x-ray 01/15/2019 FINDINGS: CT CHEST FINDINGS Cardiovascular: The heart size is normal. Scratched at the heart size upper limits of normal. Atherosclerotic calcifications are noted at the aortic valve and aortic arch. There is no aneurysm. Pulmonary artery size is normal. Mediastinum/Nodes: No significant mediastinal hilar, or axillary adenopathy is present. Esophagus is within normal limits. Thoracic inlet is normal. Lungs/Pleura: Bilateral lower lobe airspace consolidation is present. Additional patchy airspace disease is present in the superior segment of both lower lobes and in the right middle lobe. Mild patchy airspace opacities are present right upper lobe as well. Musculoskeletal: Vertebral body heights are maintained. No focal lytic or blastic lesions are present. CT ABDOMEN PELVIS FINDINGS Hepatobiliary: There is diffuse fatty infiltration of the liver. No discrete lesions are present. The common bile duct and gallbladder are within normal limits. Pancreas: Mild fatty infiltration of the pancreas is present. No discrete lesions are present. Spleen: Normal in  size without focal abnormality. Adrenals/Urinary Tract: The adrenal glands are normal bilaterally. Kidneys are unremarkable. There is no stone or mass lesion. No obstruction is present. The ureters are within normal limits. The urinary bladder is decompressed with a Foley catheter in place. Stomach/Bowel: The stomach and duodenum are within limits. Small bowel is unremarkable. Terminal ileum is normal. Appendix is visualized and within limits. The ascending and transverse colon are normal. Descending and sigmoid colon are normal. Vascular/Lymphatic: Atherosclerotic changes are present in the aorta and branch vessels. Reproductive: Prostate is unremarkable. Other: No abdominal wall hernia or abnormality. No abdominopelvic ascites. Focal fluid collection present to suggest abscess Musculoskeletal: Endplate changes are present at L5-S1. Vertebral body heights are maintained. There is some straightening of lumbar lordosis. Bony pelvis is within normal limits. Hips are located and within normal limits. IMPRESSION: 1. Bilateral lower lobe and right middle lobe airspace disease compatible with multifocal pneumonia. There is likely involvement right upper lobe as well. 2. Hepatic steatosis. 3. No other acute or focal abnormality to explain the patient's abdominal pain. 4. Degenerative changes in the lower lumbar spine. 5. Aortic Atherosclerosis (ICD10-I70.0). Electronically Signed   By: San Morelle M.D.   On: 01/15/2019 16:34   CT LUMBAR SPINE WO CONTRAST  Result Date: 01/17/2019 CLINICAL DATA:  Right lumbosacral pain radiating to the leg. Low back pain with increased fracture risk EXAM: CT LUMBAR SPINE WITHOUT CONTRAST TECHNIQUE: Multidetector CT imaging of the lumbar spine was  performed without intravenous contrast administration. Multiplanar CT image reconstructions were also generated. COMPARISON:  None. FINDINGS: Segmentation: 5 lumbar type vertebrae Alignment: Normal Vertebrae: No evidence of fracture,  discitis, or aggressive bone lesion Paraspinal and other soft tissues: Bilateral pneumonia in this patient with known COVID-19. Disc levels: Disc narrowing and endplate degeneration at L4-5. Mild facet spurring at L4-5 and below IMPRESSION: 1. No acute finding in the lumbar spine. 2. L4-5 moderate disc degeneration. Electronically Signed   By: Monte Fantasia M.D.   On: 01/17/2019 09:40   CT PELVIS WO CONTRAST  Result Date: 01/17/2019 CLINICAL DATA:  Pelvic fracture. EXAM: CT PELVIS WITHOUT CONTRAST TECHNIQUE: Multidetector CT imaging of the pelvis was performed following the standard protocol without intravenous contrast. COMPARISON:  January 15, 2019. FINDINGS: Urinary Tract: Urinary bladder is decompressed secondary to Foley catheter. Bowel:  Unremarkable visualized pelvic bowel loops. Vascular/Lymphatic: Atherosclerosis of visualized abdominal aorta and iliac arteries is noted. No adenopathy is noted. Reproductive:  No mass or other significant abnormality Other:  None. Musculoskeletal: No suspicious bone lesions identified. IMPRESSION: No definite evidence of pelvic fracture or other significant bony abnormality. Aortic Atherosclerosis (ICD10-I70.0). Electronically Signed   By: Marijo Conception M.D.   On: 01/17/2019 09:45   MR BRAIN WO CONTRAST  Result Date: 01/23/2019 CLINICAL DATA:  Encephalopathy.  Coronavirus infection. EXAM: MRI HEAD WITHOUT CONTRAST TECHNIQUE: Multiplanar, multiecho pulse sequences of the brain and surrounding structures were obtained without intravenous contrast. COMPARISON:  Head CT 01/21/2019 FINDINGS: Brain: Diffusion imaging does not show any acute or subacute infarction or other cause of restricted diffusion. There are chronic small-vessel ischemic changes of the pons in the cerebral hemispheric white matter. No cortical or large vessel territory insult. No mass lesion, hemorrhage, hydrocephalus or extra-axial collection. Vascular: Major vessels at the base of the brain  show flow. Skull and upper cervical spine: Negative Sinuses/Orbits: Clear/normal Other: None IMPRESSION: No acute or reversible finding. Atrophy and chronic small-vessel ischemic changes affecting the brain. No finding attributable to coronavirus infection. Electronically Signed   By: Nelson Chimes M.D.   On: 01/23/2019 13:39   MR CERVICAL SPINE WO CONTRAST  Result Date: 01/23/2019 CLINICAL DATA:  Enephalopathy.  Fevers.  Coronavirus infection. EXAM: MRI CERVICAL SPINE WITHOUT CONTRAST TECHNIQUE: Multiplanar, multisequence MR imaging of the cervical spine was performed. No intravenous contrast was administered. COMPARISON:  None. FINDINGS: Alignment: Straightening of the normal cervical lordosis. Vertebrae: No acute bone finding. Chronic discogenic endplate marrow changes. No active edema. Cord: No cord compression or primary cord lesion. Posterior Fossa, vertebral arteries, paraspinal tissues: Negative Disc levels: No significant finding at C3-4 or above. C4-5: Mild bulging of the disc. No compressive canal or foraminal narrowing. C5-6: Bulging of the disc. Bilateral uncovertebral prominence right worse than left. Bilateral foraminal narrowing could affect either C6 nerve. C6-7: Mild bulging of the disc.  No compressive stenosis. C7-T1: Normal interspace. IMPRESSION: No acute finding. No cord compression or cord lesion. No finding attributable to coronavirus infection. Degenerative cervical spondylosis as above. Electronically Signed   By: Nelson Chimes M.D.   On: 01/23/2019 13:42   MR THORACIC SPINE WO CONTRAST  Result Date: 01/23/2019 CLINICAL DATA:  Back pain.  Coronavirus infection. EXAM: MRI THORACIC SPINE WITHOUT CONTRAST TECHNIQUE: Multiplanar, multisequence MR imaging of the thoracic spine was performed. No intravenous contrast was administered. COMPARISON:  CT 01/15/2019 FINDINGS: Alignment:  Normal Vertebrae: No fracture or primary bone lesion. Cord:  No cord compression or primary cord lesion.  Paraspinal and other soft  tissues: No visible pleural effusions. Disc levels: No significant disc or disc level pathology. No stenosis of the canal or foramina. No significant facet arthropathy. IMPRESSION: Negative study of the thoracic spine. No abnormality seen to explain pain. No sign of spinal infection. Electronically Signed   By: Nelson Chimes M.D.   On: 01/23/2019 13:45   MR LUMBAR SPINE WO CONTRAST  Result Date: 01/23/2019 CLINICAL DATA:  Spinal stenosis.  Coronavirus infection.  Fever. EXAM: MRI LUMBAR SPINE WITHOUT CONTRAST TECHNIQUE: Multiplanar, multisequence MR imaging of the lumbar spine was performed. No intravenous contrast was administered. COMPARISON:  CT 01/17/2019 FINDINGS: Segmentation:  5 lumbar type vertebral bodies. Alignment:  Normal Vertebrae: Chronic discogenic endplate marrow changes at L4-5. Otherwise negative. Conus medullaris and cauda equina: Conus extends to the L1-2 level. Conus and cauda equina appear normal. Paraspinal and other soft tissues: Negative Disc levels: No abnormality at L3-4 or above. L4-5: Chronic disc degeneration with loss of disc height, endplate osteophytes and bulging of the disc. Mild narrowing of the lateral recesses and foramina but no visible neural compression. L5-S1: No disc abnormality. Mild facet osteoarthritis. No stenosis. IMPRESSION: Chronic degenerative disc disease at L4-5. Mild narrowing of the lateral recesses and foramina but no visible neural compression. L5-S1: Mild facet osteoarthritis.  No stenosis. No finding to suggest acute regional infection. Electronically Signed   By: Nelson Chimes M.D.   On: 01/23/2019 13:44   CXR am  Result Date: 01/21/2019 CLINICAL DATA:  Shortness of breath EXAM: PORTABLE CHEST 1 VIEW COMPARISON:  01/15/2019 FINDINGS: Extensive bilateral airspace disease, progressed. Cardiomegaly that is chronic. No suspected pleural effusion. No pneumothorax. IMPRESSION: Extensive and progressive bilateral pneumonia.  Electronically Signed   By: Monte Fantasia M.D.   On: 01/21/2019 09:53   CXR am  Result Date: 01/15/2019 CLINICAL DATA:  Acute shortness of breath. Follow-up COVID-19 pneumonia. EXAM: PORTABLE CHEST 1 VIEW 11:16 a.m.: COMPARISON:  Portable chest x-ray earlier same day at 2:55 a.m. FINDINGS: Cardiac silhouette enlarged for AP portable technique, unchanged. Progressive airspace consolidation involving the lower lobes and the RIGHT MIDDLE LOBE since the examination earlier this morning. Streaky opacities in the lingula. UPPER lobes remain clear otherwise. Normal pulmonary vascularity. IMPRESSION: 1. Progressive bilateral lower lobe pneumonia since the examination earlier this morning. 2. Streaky opacities in the lingula, likely atelectasis. 3. Stable cardiomegaly without pulmonary edema. Electronically Signed   By: Evangeline Dakin M.D.   On: 01/15/2019 11:26   DG Chest Port 1 View  Result Date: 01/15/2019 CLINICAL DATA:  Shortness of breath. EXAM: PORTABLE CHEST 1 VIEW COMPARISON:  None. FINDINGS: The heart is enlarged. Patchy right greater than left airspace opacities in the lung bases. No pulmonary edema. No definite pleural effusion. No pneumothorax. No acute osseous abnormalities are seen. IMPRESSION: 1. Right greater than left basilar airspace opacities, suspicious for pneumonia. 2. Mild cardiomegaly. Electronically Signed   By: Keith Rake M.D.   On: 01/15/2019 03:19   ECHOCARDIOGRAM COMPLETE  Result Date: 01/16/2019   ECHOCARDIOGRAM REPORT   Patient Name:   ROBB SIBAL Date of Exam: 01/16/2019 Medical Rec #:  [id number]    Height: Accession #:    [id number]   Weight:       283.1 lb Date of Birth:  1955-04-08   BSA:          2.55 m Patient Age:    11 years     BP:           155/98 mmHg Patient Gender: M  HR:           105 bpm. Exam Location:  Inpatient Procedure: 2D Echo Indications:    Chest Pain 786.50 / R07.9  History:        Patient has no prior history of Echocardiogram  examinations.                 Risk Factors:Hypertension and Diabetes. COVID 19.  Sonographer:    Darlina Sicilian RDCS Referring Phys: Graylin Shiver [facility]  Sonographer Comments: Echo performed with the patient sitting upright in his chair IMPRESSIONS  1. Left ventricular ejection fraction, by visual estimation, is 60 to 65%. The left ventricle has normal function. There is mildly increased left ventricular hypertrophy.  2. Left ventricular diastolic parameters are consistent with Grade I diastolic dysfunction (impaired relaxation).  3. The left ventricle has no regional wall motion abnormalities.  4. Global right ventricle has normal systolic function.The right ventricular size is normal.  5. Left atrial size was normal.  6. Right atrial size was normal.  7. Mild mitral annular calcification.  8. The mitral valve is normal in structure. No evidence of mitral valve regurgitation. No evidence of mitral stenosis.  9. The tricuspid valve is normal in structure. Tricuspid valve regurgitation is mild. 10. The aortic valve has an indeterminant number of cusps. Aortic valve regurgitation is not visualized. Mild to moderate aortic valve sclerosis/calcification without any evidence of aortic stenosis. 11. The pulmonic valve was not well visualized. Pulmonic valve regurgitation is not visualized. 12. Technically difficult; normal LV systolic function; grade 1 diastolic dysfunction; mild LVH with mild proximal septal thickening; calcified aortic valve (not well interrogated but no clear AS by doppler). FINDINGS  Left Ventricle: Left ventricular ejection fraction, by visual estimation, is 60 to 65%. The left ventricle has normal function. The left ventricle has no regional wall motion abnormalities. There is mildly increased left ventricular hypertrophy. Left ventricular diastolic parameters are consistent with Grade I diastolic dysfunction (impaired relaxation). Normal left atrial pressure. Right Ventricle: The right  ventricular size is normal.Global RV systolic function is has normal systolic function. Left Atrium: Left atrial size was normal in size. Right Atrium: Right atrial size was normal in size Pericardium: There is no evidence of pericardial effusion. Mitral Valve: The mitral valve is normal in structure. Mild mitral annular calcification. No evidence of mitral valve regurgitation. No evidence of mitral valve stenosis by observation. Tricuspid Valve: The tricuspid valve is normal in structure. Tricuspid valve regurgitation is mild. Aortic Valve: The aortic valve has an indeterminant number of cusps. Aortic valve regurgitation is not visualized. Mild to moderate aortic valve sclerosis/calcification is present, without any evidence of aortic stenosis. Pulmonic Valve: The pulmonic valve was not well visualized. Pulmonic valve regurgitation is not visualized. Pulmonic regurgitation is not visualized. Aorta: The aortic root is normal in size and structure. Venous: The inferior vena cava was not well visualized.  Additional Comments: Technically difficult; normal LV systolic function; grade 1 diastolic dysfunction; mild LVH with mild proximal septal thickening; calcified aortic valve (not well interrogated but no clear AS by doppler).  LEFT VENTRICLE PLAX 2D LVIDd:         3.78 cm  Diastology LVIDs:         3.04 cm  LV e' lateral:   10.60 cm/s LV PW:         0.90 cm  LV E/e' lateral: 9.0 LV IVS:        1.60 cm  LV e' medial:  6.96 cm/s LVOT diam:     1.90 cm  LV E/e' medial:  13.7 LV SV:         25 ml LVOT Area:     2.84 cm  LEFT ATRIUM             Index LA diam:        3.50 cm 1.37 cm/m LA Vol (A2C):   64.9 ml 25.50 ml/m LA Vol (A4C):   37.7 ml 14.81 ml/m LA Biplane Vol: 54.7 ml 21.49 ml/m  AORTIC VALVE LVOT Vmax:   99.20 cm/s LVOT Vmean:  59.300 cm/s LVOT VTI:    0.156 m  AORTA Ao Root diam: 3.10 cm MITRAL VALVE                         TRICUSPID VALVE MV Area (PHT): 7.09 cm              TR Peak grad:   35.0 mmHg MV  PHT:        31.03 msec            TR Vmax:        296.00 cm/s MV Decel Time: 107 msec MV E velocity: 95.50 cm/s  103 cm/s  SHUNTS MV A velocity: 120.00 cm/s 70.3 cm/s Systemic VTI:  0.16 m MV E/A ratio:  0.80        1.5       Systemic Diam: 1.90 cm  Kirk Ruths MD Electronically signed by Kirk Ruths MD Signature Date/Time: 01/16/2019/1:35:47 PM    Final

## 2019-01-24 LAB — CBC WITH DIFFERENTIAL/PLATELET
Abs Immature Granulocytes: 0.54 10*3/uL — ABNORMAL HIGH (ref 0.00–0.07)
Basophils Absolute: 0 10*3/uL (ref 0.0–0.1)
Basophils Relative: 0 %
Eosinophils Absolute: 0 10*3/uL (ref 0.0–0.5)
Eosinophils Relative: 0 %
HCT: 38.1 % — ABNORMAL LOW (ref 39.0–52.0)
Hemoglobin: 11.8 g/dL — ABNORMAL LOW (ref 13.0–17.0)
Immature Granulocytes: 3 %
Lymphocytes Relative: 6 %
Lymphs Abs: 1.2 10*3/uL (ref 0.7–4.0)
MCH: 24.4 pg — ABNORMAL LOW (ref 26.0–34.0)
MCHC: 31 g/dL (ref 30.0–36.0)
MCV: 78.9 fL — ABNORMAL LOW (ref 80.0–100.0)
Monocytes Absolute: 0.8 10*3/uL (ref 0.1–1.0)
Monocytes Relative: 4 %
Neutro Abs: 18.4 10*3/uL — ABNORMAL HIGH (ref 1.7–7.7)
Neutrophils Relative %: 87 %
Platelets: 469 10*3/uL — ABNORMAL HIGH (ref 150–400)
RBC: 4.83 MIL/uL (ref 4.22–5.81)
RDW: 14.9 % (ref 11.5–15.5)
WBC: 20.9 10*3/uL — ABNORMAL HIGH (ref 4.0–10.5)
nRBC: 0 % (ref 0.0–0.2)

## 2019-01-24 LAB — COMPREHENSIVE METABOLIC PANEL
ALT: 47 U/L — ABNORMAL HIGH (ref 0–44)
AST: 33 U/L (ref 15–41)
Albumin: 2.6 g/dL — ABNORMAL LOW (ref 3.5–5.0)
Alkaline Phosphatase: 60 U/L (ref 38–126)
Anion gap: 9 (ref 5–15)
BUN: 28 mg/dL — ABNORMAL HIGH (ref 8–23)
CO2: 30 mmol/L (ref 22–32)
Calcium: 7.9 mg/dL — ABNORMAL LOW (ref 8.9–10.3)
Chloride: 99 mmol/L (ref 98–111)
Creatinine, Ser: 1.18 mg/dL (ref 0.61–1.24)
GFR calc Af Amer: >60 mL/min (ref >60)
GFR calc non Af Amer: >60 mL/min (ref >60)
Glucose, Bld: 154 mg/dL — ABNORMAL HIGH (ref 70–99)
Potassium: 4.6 mmol/L (ref 3.5–5.1)
Sodium: 138 mmol/L (ref 135–145)
Total Bilirubin: 0.7 mg/dL (ref 0.3–1.2)
Total Protein: 7.2 g/dL (ref 6.5–8.1)

## 2019-01-24 LAB — PROCALCITONIN: Procalcitonin: 1.01 ng/mL

## 2019-01-24 LAB — GLUCOSE, CAPILLARY
Glucose-Capillary: 124 mg/dL — ABNORMAL HIGH (ref 70–99)
Glucose-Capillary: 125 mg/dL — ABNORMAL HIGH (ref 70–99)
Glucose-Capillary: 140 mg/dL — ABNORMAL HIGH (ref 70–99)
Glucose-Capillary: 185 mg/dL — ABNORMAL HIGH (ref 70–99)
Glucose-Capillary: 244 mg/dL — ABNORMAL HIGH (ref 70–99)
Glucose-Capillary: 251 mg/dL — ABNORMAL HIGH (ref 70–99)
Glucose-Capillary: 335 mg/dL — ABNORMAL HIGH (ref 70–99)

## 2019-01-24 LAB — D-DIMER, QUANTITATIVE: D-Dimer, Quant: 1.03 ug/mL-FEU — ABNORMAL HIGH (ref 0.00–0.50)

## 2019-01-24 LAB — C-REACTIVE PROTEIN: CRP: 9.3 mg/dL — ABNORMAL HIGH (ref <1.0)

## 2019-01-24 LAB — BRAIN NATRIURETIC PEPTIDE: B Natriuretic Peptide: 34.6 pg/mL (ref 0.0–100.0)

## 2019-01-24 NOTE — Progress Notes (Addendum)
Inpatient Diabetes Program Recommendations  AACE/ADA: New Consensus Statement on Inpatient Glycemic Control (2015)  Target Ranges:  Prepandial:   less than 140 mg/dL      Peak postprandial:   less than 180 mg/dL (1-2 hours)      Critically ill patients:  140 - 180 mg/dL   Lab Results  Component Value Date   GLUCAP 124 (H) 01/24/2019   HGBA1C 8.2 (H) 01/15/2019    Review of Glycemic Control Results for JORON, VELIS (MRN [id number]) as of 01/24/2019 10:28  Ref. Range 01/24/2019 03:40 01/24/2019 04:52 01/24/2019 07:45  Glucose-Capillary Latest Ref Range: 70 - 99 mg/dL 140 (H) 125 (H) 124 (H)   Diabetes history: DM2 Outpatient Diabetes medications: Amaryl 2 mg tidwc, metformin 850 mg bid Current orders for Inpatient glycemic control: Lantus 25 units BID, Novolog 0-20 units Q4H + 3 units tidwc Solumedrol 40 mg BID  Inpatient Diabetes Program Recommendations:    Consider changing correction to Novolog 0-20 units TID and increasing meal coverage to Novolog 6 units TID (assuming patient is consuming >50% of meal).  Attempting to reach patient, no answer. Reached out to RN for additional assistance. Will continue to attempt.   Addendum@1100 : Spoke with patient regarding outpatient plan for diabetes management.  Reviewed patient's current A1c of 8.2%. Explained what a A1c is and what it measures. Also reviewed goal A1c with patient, importance of good glucose control @ home, and blood sugar goals. Reviewed patho of DM, role of pancreas, oral agent benefits compared to insulin, insulin requirements while inpatient, steroid impact to glucose trends, survival skills to include hypo interventions, 70/30, vascular changes and comorbidities.  Patient will need a meter at discharge. Encouraged to being checking glucose 2 times per day. Meters are available per house coverage. CM consult placed for meter and medications at discharge.  RN to work with patient today on self injections. Explained to  patient and stressed the importance of making an appointment with PCP and discussed when to call MD. Additionally, reviewed importance of being mindful of foods and beverages containing carbohydrates. Patient denies consumption. Patient has no further questions at this time.  At time of discharge, with current lack of insurance, Novoloin 70/30 would be best option. Will attach information to DC summary and patient able to afford.  Thanks, Bronson Curb, MSN, RNC-OB Diabetes Coordinator [phone] (8a-5p)

## 2019-01-24 NOTE — Discharge Instructions (Signed)
Insulin Injection Instructions, Single Insulin Dose, Adult A subcutaneous injection is a shot of medicine that is injected into the layer of fat and tissue between skin and muscle. People with type 1 diabetes must take insulin because their bodies do not make it. People with type 2 diabetes may need to take insulin.  There are many different types of insulin. The type of insulin that you take may determine how many injections you give yourself and when you need to give the injections. Supplies needed:  Soap and water to wash hands.  A new, unused insulin syringe.  Your insulin medication bottle (vial).  Alcohol wipes.  A disposal container that is meant for sharp items (sharps container), such as an empty plastic bottle with a cover. How to choose a site for injection The body absorbs insulin differently, depending on where the insulin is injected (injection site). It is best to inject insulin into the same body area each time (for example, always in the abdomen), but you should use a different spot in that area for each injection. Do not inject the insulin in the same spot each time. There are five main areas that can be used for injecting. These areas include:  Abdomen. This is the preferred area.  Front of thigh.  Upper, outer side of thigh.  Upper, outer side of arm.  Upper, outer part of buttock. How to give a single-dose insulin injection First, follow the steps for Get ready, then continue with the steps for Push air into the vial, then follow the steps for Fill the syringe, and finish with the steps for Inject the insulin. Get ready 1. Wash your hands with soap and water. If soap and water are not available, use hand sanitizer. 2. Before you give yourself an insulin injection, be sure to test your blood sugar level (blood glucose level) and write down that number. Follow any instructions from your health care provider about what to do if your blood glucose level is higher or  lower than your normal range. 3. Use a new, unused insulin syringe each time you need to inject insulin. 4. Check to make sure you have the correct type of insulin syringe for the concentration of insulin that you are using. 5. Check the expiration date and the type of insulin that you are using. 6. If you are using CLEAR insulin, check to see that it is clear and free of clumps. 7. Do not shake the vial to get it ready. Gently roll the vial between your palms several times. 8. Remove the plastic pop-top covering from the vial of insulin. This type of covering is present on a vial when it is new. 9. Use an alcohol wipe to clean the rubber top of the vial. 10. Remove the plastic cover from the syringe needle. Do not let the needle touch anything. Push air into the vial 1. To bring (draw up) air into the syringe, slowly pull back on the syringe plunger. Stop pulling the plunger when the dose indicator gets to the number of units that you will be using. 2. While you keep the vial right-side-up, poke the needle through the rubber top of the vial. Do not turn the vial upside down to do this. 3. Push the plunger all the way into the syringe. Doing that will push air into the vial. 4. Do not take the needle out of the vial yet. Fill the syringe  1. While the needle is still in the vial, turn  the vial upside down and hold it at eye level. 2. Slowly pull back on the plunger. Stop pulling the plunger when the dose indicator gets to the desired number of units. 3. If you see air bubbles in the syringe, slowly move the plunger up and down 2 or 3 times to make them go away. ? If you had to move the plunger to get rid of air bubbles, pull back the plunger until the dose indicator returns to the correct dose. 4. Remove the needle from the vial. Do not let the needle touch anything. Inject the insulin  1. Use an alcohol wipe to clean the site where you will be injecting the needle. Let the site  air-dry. 2. Hold the syringe in your writing hand like a pencil. 3. Use your other hand to pinch and hold about an inch (2.5 cm) of skin. Do not directly touch the cleaned part of the skin. 4. Gently but quickly, put the needle straight into the skin. The needle should be at a 90-degree angle (perpendicular) to the skin. 5. Push the needle in as far as it will go (to the hub). 6. When the needle is completely inserted into the skin, use your thumb or index finger of your writing hand to push the plunger all the way into the syringe to inject the insulin. 7. Let go of the skin that you are pinching. Continue to hold the syringe in place with your writing hand. 8. Wait 10 seconds, then pull the needle straight out of the skin. This will allow all of the insulin to go from the syringe and needle into your body. 9. Press and hold the alcohol wipe over the injection site until any bleeding stops. Do not rub the area. 10. Do not put the plastic cover back on the needle. 11. Discard the syringe and needle directly into a sharps container, such as an empty plastic bottle with a cover. How to throw away supplies  Discard all used needles in a puncture-proof sharps disposal container. You can ask your local pharmacy about where you can get this kind of disposal container, or you can use an empty plastic liquid laundry detergent bottle that has a cover.  Follow the disposal regulations for the area where you live. Do not use any syringe or needle more than one time.  Throw away empty vials in the regular trash. Questions to ask your health care provider  How often should I be taking insulin?  How often should I check my blood glucose?  What amount of insulin should I be taking at each time?  What are the side effects?  What should I do if my blood glucose is too high?  What should I do if my blood glucose is too low?  What should I do if I forget to take my insulin?  What number should I call  if I have questions? Where to find more information  American Diabetes Association (ADA): www.diabetes.org  American Association of Diabetes Educators (AADE) Patient Resources: https://www.diabeteseducator.org Summary  A subcutaneous injection is a shot of medicine that is injected into the layer of fat and tissue between skin and muscle.  Before you give yourself an insulin injection, be sure to test your blood sugar level (blood glucose level) and write down that number.  The type of insulin that you take may determine how many injections you give yourself and when you need to give the injections.  Check the expiration date and the  type of insulin that you are using.  It is best to inject insulin into the same body area each time (for example, always in the abdomen), but you should use a different spot in that area for each injection. This information is not intended to replace advice given to you by your health care provider. Make sure you discuss any questions you have with your health care provider. Document Released: 02/26/2015 Document Revised: 01/26/2017 Document Reviewed: 02/26/2015 Elsevier Patient Education  2020 [employer]  Hyperglycemia Hyperglycemia occurs when the level of sugar (glucose) in the blood is too high. Glucose is a type of sugar that provides the body's main source of energy. Certain hormones (insulin and glucagon) control the level of glucose in the blood. Insulin lowers blood glucose, and glucagon increases blood glucose. Hyperglycemia can result from having too little insulin in the bloodstream, or from the body not responding normally to insulin. Hyperglycemia occurs most often in people who have diabetes (diabetes mellitus), but it can happen in people who do not have diabetes. It can develop quickly, and it can be life-threatening if it causes you to become severely dehydrated (diabetic ketoacidosis or hyperglycemic hyperosmolar state). Severe hyperglycemia  is a medical emergency. What are the causes? If you have diabetes, hyperglycemia may be caused by:  Diabetes medicine.  Medicines that increase blood glucose or affect your diabetes control.  Not eating enough, or not eating often enough.  Changes in physical activity level.  Being sick or having an infection. If you have prediabetes or undiagnosed diabetes:  Hyperglycemia may be caused by those conditions. If you do not have diabetes, hyperglycemia may be caused by:  Certain medicines, including steroid medicines, beta-blockers, epinephrine, and thiazide diuretics.  Stress.  Serious illness.  Surgery.  Diseases of the pancreas.  Infection. What increases the risk? Hyperglycemia is more likely to develop in people who have risk factors for diabetes, such as:  Having a family member with diabetes.  Having a gene for type 1 diabetes that is passed from parent to child (inherited).  Living in an area with cold weather conditions.  Exposure to certain viruses.  Certain conditions in which the body's disease-fighting (immune) system attacks itself (autoimmune disorders).  Being overweight or obese.  Having an inactive (sedentary) lifestyle.  Having been diagnosed with insulin resistance.  Having a history of prediabetes, gestational diabetes, or polycystic ovarian syndrome (PCOS).  Being of American-Indian, African-American, Hispanic/Latino, or Asian/Pacific Islander descent. What are the signs or symptoms? Hyperglycemia may not cause any symptoms. If you do have symptoms, they may include early warning signs, such as:  Increased thirst.  Hunger.  Feeling very tired.  Needing to urinate more often than usual.  Blurry vision. Other symptoms may develop if hyperglycemia gets worse, such as:  Dry mouth.  Loss of appetite.  Fruity-smelling breath.  Weakness.  Unexpected or rapid weight gain or weight loss.  Tingling or numbness in the hands or  feet.  Headache.  Skin that does not quickly return to normal after being lightly pinched and released (poor skin turgor).  Abdominal pain.  Cuts or bruises that are slow to heal. How is this diagnosed? Hyperglycemia is diagnosed with a blood test to measure your blood glucose level. This blood test is usually done while you are having symptoms. Your health care provider may also do a physical exam and review your medical history. You may have more tests to determine the cause of your hyperglycemia, such as:  A fasting blood  glucose (FBG) test. You will not be allowed to eat (you will fast) for at least 8 hours before a blood sample is taken.  An A1c (hemoglobin A1c) blood test. This provides information about blood glucose control over the previous 2-3 months.  An oral glucose tolerance test (OGTT). This measures your blood glucose at two times: ? After fasting. This is your baseline blood glucose level. ? Two hours after drinking a beverage that contains glucose. How is this treated? Treatment depends on the cause of your hyperglycemia. Treatment may include:  Taking medicine to regulate your blood glucose levels. If you take insulin or other diabetes medicines, your medicine or dosage may be adjusted.  Lifestyle changes, such as exercising more, eating healthier foods, or losing weight.  Treating an illness or infection, if this caused your hyperglycemia.  Checking your blood glucose more often.  Stopping or reducing steroid medicines, if these caused your hyperglycemia. If your hyperglycemia becomes severe and it results in hyperglycemic hyperosmolar state, you must be hospitalized and given IV fluids. Follow these instructions at home:  General instructions  Take over-the-counter and prescription medicines only as told by your health care provider.  Do not use any products that contain nicotine or tobacco, such as cigarettes and e-cigarettes. If you need help quitting, ask  your health care provider.  Limit alcohol intake to no more than 1 drink per day for nonpregnant women and 2 drinks per day for men. One drink equals 12 oz of beer, 5 oz of wine, or 1 oz of hard liquor.  Learn to manage stress. If you need help with this, ask your health care provider.  Keep all follow-up visits as told by your health care provider. This is important. Eating and drinking   Maintain a healthy weight.  Exercise regularly, as directed by your health care provider.  Stay hydrated, especially when you exercise, get sick, or spend time in hot temperatures.  Eat healthy foods, such as: ? Lean proteins. ? Complex carbohydrates. ? Fresh fruits and vegetables. ? Low-fat dairy products. ? Healthy fats.  Drink enough fluid to keep your urine clear or pale yellow. If you have diabetes:  Make sure you know the symptoms of hyperglycemia.  Follow your diabetes management plan, as told by your health care provider. Make sure you: ? Take your insulin and medicines as directed. ? Follow your exercise plan. ? Follow your meal plan. Eat on time, and do not skip meals. ? Check your blood glucose as often as directed. Make sure to check your blood glucose before and after exercise. If you exercise longer or in a different way than usual, check your blood glucose more often. ? Follow your sick day plan whenever you cannot eat or drink normally. Make this plan in advance with your health care provider.  Share your diabetes management plan with people in your workplace, school, and household.  Check your urine for ketones when you are ill and as told by your health care provider.  Carry a medical alert card or wear medical alert jewelry. Contact a health care provider if:  Your blood glucose is at or above 240 mg/dL (13.3 mmol/L) for 2 days in a row.  You have problems keeping your blood glucose in your target range.  You have frequent episodes of hyperglycemia. Get help right  away if:  You have difficulty breathing.  You have a change in how you think, feel, or act (mental status).  You have nausea or vomiting  that does not go away. These symptoms may represent a serious problem that is an emergency. Do not wait to see if the symptoms will go away. Get medical help right away. Call your local emergency services (911 in the [place] Do not drive yourself to the hospital. Summary  Hyperglycemia occurs when the level of sugar (glucose) in the blood is too high.  Hyperglycemia is diagnosed with a blood test to measure your blood glucose level. This blood test is usually done while you are having symptoms. Your health care provider may also do a physical exam and review your medical history.  If you have diabetes, follow your diabetes management plan as told by your health care provider.  Contact your health care provider if you have problems keeping your blood glucose in your target range. This information is not intended to replace advice given to you by your health care provider. Make sure you discuss any questions you have with your health care provider. Document Released: 07/19/2000 Document Revised: 10/11/2015 Document Reviewed: 10/11/2015 Elsevier Patient Education  2020 [employer]  Hemoglobin A1c Test Why am I having this test? You may have the hemoglobin A1c test (HbA1c test) done to:  Evaluate your risk for developing diabetes (diabetes mellitus).  Diagnose diabetes.  Monitor long-term control of blood sugar (glucose) in people who have diabetes and help make treatment decisions. This test may be done with other blood glucose tests, such as fasting blood glucose and oral glucose tolerance tests. What is being tested? Hemoglobin is a type of protein in the blood that carries oxygen. Glucose attaches to hemoglobin to form glycated hemoglobin. This test checks the amount of glycated hemoglobin in your blood, which is a good indicator of the average  amount of glucose in your blood during the past 2-3 months. What kind of sample is taken?  A blood sample is required for this test. It is usually collected by inserting a needle into a blood vessel. Tell a health care provider about:  All medicines you are taking, including vitamins, herbs, eye drops, creams, and over-the-counter medicines.  Any blood disorders you have.  Any surgeries you have had.  Any medical conditions you have.  Whether you are pregnant or may be pregnant. How are the results reported? Your results will be reported as a percentage that indicates how much of your hemoglobin has glucose attached to it (is glycated). Your health care provider will compare your results to normal ranges that were established after testing a large group of people (reference ranges). Reference ranges may vary among labs and hospitals. For this test, common reference ranges are:  Adult or child without diabetes: 4-5.6%.  Adult or child with diabetes and good blood glucose control: less than 7%. What do the results mean? If you have diabetes:  A result of less than 7% is considered normal, meaning that your blood glucose is well controlled.  A result higher than 7% means that your blood glucose is not well controlled, and your treatment plan may need to be adjusted. If you do not have diabetes:  A result within the reference range is considered normal, meaning that you are not at high risk for diabetes.  A result of 5.7-6.4% means that you have a high risk of developing diabetes, and you may have prediabetes. Prediabetes is the condition of having a blood glucose level that is higher than it should be, but not high enough for you to be diagnosed with diabetes. Having prediabetes puts you  at risk for developing type 2 diabetes (type 2 diabetes mellitus). You may have more tests, including a repeat HbA1c test.  Results of 6.5% or higher on two separate HbA1c tests mean that you have  diabetes. You may have more tests to confirm the diagnosis. Abnormally low HbA1c values may be caused by:  Pregnancy.  Severe blood loss.  Receiving donated blood (transfusions).  Low red blood cell count (anemia).  Long-term kidney failure.  Some unusual forms (variants) of hemoglobin. Talk with your health care provider about what your results mean. Questions to ask your health care provider Ask your health care provider, or the department that is doing the test:  When will my results be ready?  How will I get my results?  What are my treatment options?  What other tests do I need?  What are my next steps? Summary  The hemoglobin A1c test (HbA1c test) may be done to evaluate your risk for developing diabetes, to diagnose diabetes, and to monitor long-term control of blood sugar (glucose) in people who have diabetes and help make treatment decisions.  Hemoglobin is a type of protein in the blood that carries oxygen. Glucose attaches to hemoglobin to form glycated hemoglobin. This test checks the amount of glycated hemoglobin in your blood, which is a good indicator of the average amount of glucose in your blood during the past 2-3 months.  Talk with your health care provider about what your results mean. This information is not intended to replace advice given to you by your health care provider. Make sure you discuss any questions you have with your health care provider. Document Released: 02/15/2004 Document Revised: 01/05/2017 Document Reviewed: 09/05/2016 Elsevier Patient Education  2020 [employer].

## 2019-01-24 NOTE — Progress Notes (Signed)
PROGRESS NOTE                                                                                                                                                                                                             Patient Demographics:    Austin Shannon, is a 63 y.o. male, DOB - 02-19-1955, ZJI:[id number]  Outpatient Primary MD for the patient is Patient, No Pcp Per    LOS - 9  Admit date - 01/15/2019    CC - AMS     Brief Narrative -  Austin Shannon  is a 63 y.o. 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   Subjective:   No acute issues or events overnight, patient states he feels much improved since admission, denies shortness of breath, chest pain, nausea, vomiting, diarrhea, constipation, headache, fevers, chills.  Patient visibly irritated/annoyed that he isn't being discharged yet.   Assessment  & Plan :    Acute hypoxic respiratory failure 2/2 sepsis, COVID-19 pneumonia, with concurrent bacterial infection(likely aspiration pneumonia). Extremely high procalcitonin in the range of 100, elevated lactic acid, elevated creatinine and high CRP.  He also has history of treated TB in the past.  Initially placed on IV remdesivir, IV steroids, IV empiric antibiotics (Unasyn and doxycycline).  Unasyn stop date 01/24/2019, doxycycline stop date 01/29/2019 Ridging to date negative for acute infectious process other than chest x-ray concerning for pneumonia as above  Continue to  monitor blood cultures.  Respiratory cultures growing few colonies of staph aureus and he is already on doxycycline. SpO2: 92 % O2 Flow Rate (L/min): 10 L/min  Recent Labs  Lab 01/18/19 0357 01/19/19 0609 01/19/19 0610 01/21/19 0440 01/22/19 0135 01/23/19 0305 01/24/19 0035  CRP 11.9*  --  11.3* 19.9* 22.5* 12.7* 9.3*  DDIMER 1.93* 1.77*  --  1.42* 1.20* 0.93* 1.03*  BNP 134.1* 167.1*  --  39.6 36.8 28.9 34.6  PROCALCITON 27.77  --   --  4.00 2.65 1.49 1.01    Hepatic Function Latest Ref  Rng & Units 01/24/2019 01/23/2019 01/22/2019  Total Protein 6.5 - 8.1 g/dL 7.2 7.0 7.4  Albumin 3.5 - 5.0 g/dL 2.6(L) 2.4(L) 2.7(L)  AST 15 - 41 U/L 33 34 34  ALT 0 - 44 U/L 47(H) 43 43  Alk Phosphatase 38 - 126 U/L 60 60 70  Total Bilirubin 0.3 - 1.2 mg/dL 0.7 0.4 0.2(L)    Acute metabolic encephalopathy, recurrent febrile illness; resolved Likely Covid encephalopathy MRI of the head, neck, thoracic and lumbar spine unremarkable as below Discussed with neurology, if patient continues to have mental status changes and fevers would consider LP however at this time given resolution and improvement will hold off on further testing  Dehydration, rhabdomyolysis with AKI and hyperkalemia, resolving.  Continue Foley for now, CT scan nonacute, improving with hydration and Kayexalate, CK has improved. IV fluids stopped, tolerating PO well.  No baseline creatinine in system patient is from [place]. Lab Results  Component Value Date   CREATININE 1.18 01/24/2019   CREATININE 1.36 (H) 01/23/2019   CREATININE 1.54 (H) 01/22/2019    Elevated liver enzymes.  Due to COVID-19 along with dehydration/rhabdomyolysis, completely resolved. Hepatic Function Latest Ref Rng & Units 01/24/2019 01/23/2019 01/22/2019  Total Protein 6.5 - 8.1 g/dL 7.2 7.0 7.4  Albumin 3.5 - 5.0 g/dL 2.6(L) 2.4(L) 2.7(L)  AST 15 - 41 U/L 33 34 34  ALT 0 - 44 U/L 47(H) 43 43  Alk Phosphatase 38 - 126 U/L 60 60 70  Total Bilirubin  0.3 - 1.2 mg/dL 0.7 0.4 0.2(L)    Dyslipidemia. Continue statin.  DM type II, uncontrolled.  Reduced Lantus 25BID  Poor outpatient glycemic control due to hyperglycemia.   Diabetic and insulin education ordered. Continue diabetic diet. Continue sliding scale insulin while on steroids Recent Labs    01/24/19 0452 01/24/19 0745 01/24/19 1113  GLUCAP 125* 124* 251*   Lab Results  Component Value Date   HGBA1C 8.2 (H) 01/15/2019    Borderline elevated troponin.  Not an ACS pattern, chest pain-free, EKG nonacute, aspirin, beta-blocker, stable echo.  Hypertension.  Stable on Coreg and Norvasc.  Dysphagia and odynophagia. Soft tissue neck CT unremarkable, non-contrast due to renal failure, supportive care with antibiotics, throat discomfort has resolved. Speech following and currently on regular diet per speech.  Right-sided lumbosacral discomfort.  Most likely musculoskeletal due to his recent fall at home, CT L-spine and pelvis unremarkable and nonacute. MRI of L spine unremarkable. Supportive care with NSAID cream, much improved.  Intermittent right leg weakness, minimal right arm weakness.  Initially thought to be due to lumbosacral discomfort, worse on 01/21/2019, head CT unremarkable, currently on aspirin and statin.  MRI head, C, T, and L spine unremarkable for acute findings  Fevers, resolved  He was initially febrile when he came in, this resolved after he was started on Covid treatment along with antibiotics for aspiration Concern for intermittent aspirating while supine and eating Will continue Unasyn and continue doxycycline as above.   MRI of Head, C, T, and L spine unremarkable Sputum cultures grew few staph aureus for which she is on doxycycline.  Procalcitonin trending down appropriately  Mild acute on chronic diastolic CHF EF 45% on echocardiogram.   Appears euvolemic Continue beta-blocker. Adequate urine output  after single dose of Lasix Continue to  follow I's and O's - remains net negative  Condition - Guarded Family Communication :  Wife/Brother updated over phone Code Status :  DNR Diet :  Diet Order  Diet Carb Modified Fluid consistency: Thin; Room service appropriate? Yes  Diet effective now              Disposition Plan: Patient remains as inpatient, continues to require high flow nasal cannula at 10 L to maintain saturations.  Patient has already called his wife and brother to come pick him up from [place], they are already in route.  I discussed that the patient would likely need to be here through Sunday if not Monday or Tuesday again pending on his oxygen levels.  I am concerned the patient will leave AMA once his family arrives, we had discussed with the patient and family that should patient leave with his moderate to severe hypoxia he would likely not fare well and has increased risk for morbidity mortality.  Case management helping to hopefully discharge patient on oxygen once he is at a more reasonable level, currently at 10 L nasal cannula is certainly not reasonable for any outpatient or home use oxygen. Consults  : Neurology Dr. Lorraine Lax Procedures:    MRI Head, C,T, and L spine unremarkable as below  CT soft tissue neck, chest abdomen pelvis.  All noncontrast due to AKI.  Nonacute except for bilateral pneumonia.  CT L-spine and pelvis.  Nonacute.  Echocardiogram:  1. Left ventricular ejection fraction, by visual estimation, is 60 to 65%. The left ventricle has normal function. There is mildly increased left ventricular hypertrophy.  2. Left ventricular diastolic parameters are consistent with Grade I diastolic dysfunction (impaired relaxation).  3. The left ventricle has no regional wall motion abnormalities.  4. Global right ventricle has normal systolic function.The right ventricular size is normal.  5. Left atrial size was normal.  6. Right atrial size was normal.  7. Mild mitral annular calcification.   8. The mitral valve is normal in structure. No evidence of mitral valve regurgitation. No evidence of mitral stenosis.  9. The tricuspid valve is normal in structure. Tricuspid valve regurgitation is mild. 10. The aortic valve has an indeterminant number of cusps. Aortic valve regurgitation is not visualized. Mild to moderate aortic valve sclerosis/calcification without any evidence of aortic stenosis. 11. The pulmonic valve was not well visualized. Pulmonic valve regurgitation is not visualized. 12. Technically difficult; normal LV systolic function; grade 1 diastolic dysfunction; mild LVH with mild proximal septal thickening; calcified aortic valve (not well interrogated but no clear AS by doppler).   PUD Prophylaxis : PPI  DVT Prophylaxis:  Switch to high-dose prophylactic Lovenox on 01/16/2019  Lab Results  Component Value Date   PLT 469 (H) 01/24/2019    Inpatient Medications  Scheduled Meds:  amLODipine  10 mg Oral Daily   aspirin EC  325 mg Oral Daily   atorvastatin  80 mg Oral q1800   carvedilol  6.25 mg Oral BID   Chlorhexidine Gluconate Cloth  6 each Topical Daily   diclofenac Sodium  2 g Topical QID   docusate sodium  100 mg Oral BID   doxycycline  100 mg Oral Q12H   enoxaparin (LOVENOX) injection  65 mg Subcutaneous Q24H   insulin aspart  0-20 Units Subcutaneous Q4H   insulin aspart  3 Units Subcutaneous TID WC   insulin glargine  25 Units Subcutaneous BID   insulin starter kit- syringes  1 kit Other Once   LORazepam  1 mg Intravenous Once   methylPREDNISolone (SOLU-MEDROL) injection  40 mg Intravenous Q12H   pantoprazole  40 mg Oral BID   Continuous Infusions:  ampicillin-sulbactam (UNASYN)  IV 3 g (01/24/19 0906)   PRN Meds:.acetaminophen, acetaminophen, menthol-cetylpyridinium, metoprolol tartrate, [DISCONTINUED] ondansetron **OR** ondansetron (ZOFRAN) IV, senna, traMADol  Antibiotics  :    Anti-infectives (From admission, onward)   Start      Dose/Rate Route Frequency Ordered Stop   01/21/19 1800  Ampicillin-Sulbactam (UNASYN) 3 g in sodium chloride 0.9 % 100 mL IVPB     3 g 200 mL/hr over 30 Minutes Intravenous Every 6 hours 01/21/19 1325     01/20/19 2200  doxycycline (VIBRA-TABS) tablet 100 mg     100 mg Oral Every 12 hours 01/20/19 1827     01/16/19 1000  remdesivir 100 mg in sodium chloride 0.9 % 100 mL IVPB  Status:  Discontinued     100 mg 200 mL/hr over 30 Minutes Intravenous Daily 01/15/19 1104 01/15/19 1107   01/16/19 1000  remdesivir 100 mg in sodium chloride 0.9 % 100 mL IVPB     100 mg 200 mL/hr over 30 Minutes Intravenous Daily 01/15/19 1114 01/19/19 1100   01/15/19 1200  remdesivir 200 mg in sodium chloride 0.9% 250 mL IVPB  Status:  Discontinued     200 mg 580 mL/hr over 30 Minutes Intravenous Once 01/15/19 1104 01/15/19 1107   01/15/19 1200  Ampicillin-Sulbactam (UNASYN) 3 g in sodium chloride 0.9 % 100 mL IVPB  Status:  Discontinued     3 g 200 mL/hr over 30 Minutes Intravenous Every 6 hours 01/15/19 1107 01/21/19 1325       Time Spent in minutes  30   Little Ishikawa M.D on 01/24/2019 at 2:36 PM  To page go to [email or web address] - password [facility]  [facility] -  Office  [phone]  See all Orders from today for further details    Objective:   Vitals:   01/23/19 2300 01/24/19 0342 01/24/19 0400 01/24/19 0804  BP:  135/80  132/80  Pulse:  81  85  Resp: (!) 24 (!) 23 (!) 24   Temp:  97.7 F (36.5 C)    TempSrc:  Oral  Oral  SpO2: 94% 95% 94% 92%  Weight:      Height:        Wt Readings from Last 3 Encounters:  01/15/19 128.4 kg  01/15/19 128.4 kg     Intake/Output Summary (Last 24 hours) at 01/24/2019 1436 Last data filed at 01/24/2019 1413 Gross per 24 hour  Intake 440 ml  Output 2501 ml  Net -2061 ml     Physical Exam  General:  Pleasantly resting in bed, No acute distress.  Somewhat flat mood/affect, alert oriented x4 HEENT:  Normocephalic atraumatic.  Sclerae  nonicteric, noninjected.  Extraocular movements intact bilaterally. Neck:  Without mass or deformity.  Trachea is midline. Lungs: Coarse breath sounds bilaterally without overt rhonchi, wheeze, or rales. Heart:  Regular rate and rhythm.  Without murmurs, rubs, or gallops. Abdomen:  Soft, nontender, nondistended.  Without guarding or rebound. Extremities: Without cyanosis, clubbing, edema, or obvious deformity. Vascular:  Dorsalis pedis and posterior tibial pulses palpable bilaterally. Skin:  Warm and dry, no erythema, no ulcerations.     Data Review:    CBC Recent Labs  Lab 01/19/19 0609 01/20/19 1020 01/21/19 0440 01/22/19 0135 01/23/19 0305 01/24/19 0035  WBC 13.9* 13.0* 15.2* 17.2* 19.8* 20.9*  HGB 12.4* 12.2* 13.7 13.0 12.2* 11.8*  HCT 40.4 38.7* 45.8 43.1 40.5 38.1*  PLT 270 316 412* 441* 483* 469*  MCV 79.5* 78.5* 79.8* 79.7* 80.2 78.9*  MCH 24.4* 24.7* 23.9*  24.0* 24.2* 24.4*  MCHC 30.7 31.5 29.9* 30.2 30.1 31.0  RDW 15.2 15.0 15.1 15.1 15.2 14.9  LYMPHSABS 1.3  --  1.8 1.2 1.4 1.2  MONOABS 1.2*  --  1.3* 1.0 0.9 0.8  EOSABS 0.0  --  0.0 0.0 0.0 0.0  BASOSABS 0.0  --  0.1 0.1 0.1 0.0    Chemistries  Recent Labs  Lab 01/18/19 0357 01/19/19 0609 01/20/19 1020 01/21/19 0440 01/22/19 0135 01/23/19 0305 01/24/19 0035  NA 141 141 137 140 136 137 138  K 4.3 3.9 4.1 4.1 5.1 4.6 4.6  CL 100 98 94* 94* 94* 95* 99  CO2 29 33* 33* 34* 30 30 30   GLUCOSE 113* 51* 159* 74 352* 185* 154*  BUN 30* 26* 27* 28* 33* 29* 28*  CREATININE 1.32* 1.40* 1.27* 1.50* 1.54* 1.36* 1.18  CALCIUM 7.7* 7.6* 7.7* 8.3* 8.1* 7.8* 7.9*  MG 2.8* 2.5*  --  2.4 2.5*  --   --   AST 48* 37  --  30 34 34 33  ALT 41 36  --  41 43 43 47*  ALKPHOS 55 68  --  69 70 60 60  BILITOT 0.6 0.9  --  0.5 0.2* 0.4 0.7   ------------------------------------------------------------------------------------------------------------------ No results for input(s): CHOL, HDL, LDLCALC, TRIG, CHOLHDL, LDLDIRECT  in the last 72 hours.  Lab Results  Component Value Date   HGBA1C 8.2 (H) 01/15/2019   ------------------------------------------------------------------------------------------------------------------ No results for input(s): TSH, T4TOTAL, T3FREE, THYROIDAB in the last 72 hours.  Invalid input(s): FREET3  Cardiac Enzymes No results for input(s): CKMB, TROPONINI, MYOGLOBIN in the last 168 hours.  Invalid input(s): CK ------------------------------------------------------------------------------------------------------------------    Component Value Date/Time   BNP 34.6 01/24/2019 0035    Micro Results  Recent Results (from the past 240 hour(s))  Culture, blood (routine x 2)     Status: None   Collection Time: 01/15/19  2:57 AM   Specimen: BLOOD  Result Value Ref Range Status   Specimen Description BLOOD RIGHT ANTECUBITAL  Final   Special Requests   Final    BOTTLES DRAWN AEROBIC AND ANAEROBIC Blood Culture results may not be optimal due to an excessive volume of blood received in culture bottles   Culture   Final    NO GROWTH 5 DAYS Performed at [facility], [address] [place], [place] [address]    Report Status 01/20/2019 FINAL  Final  Culture, blood (routine x 2)     Status: None   Collection Time: 01/15/19  2:57 AM   Specimen: BLOOD  Result Value Ref Range Status   Specimen Description BLOOD BLOOD LEFT HAND  Final   Special Requests   Final    BOTTLES DRAWN AEROBIC AND ANAEROBIC Blood Culture results may not be optimal due to an inadequate volume of blood received in culture bottles   Culture   Final    NO GROWTH 5 DAYS Performed at [facility], [address], [place], [place] [address]    Report Status 01/20/2019 FINAL  Final  Urine culture     Status: None   Collection Time: 01/15/19  9:41 AM   Specimen: Urine, Random  Result Value Ref Range Status   Specimen Description   Final    URINE, RANDOM Performed at [facility],  [address] [place], [place] [address]    Special Requests   Final    NONE Performed at [facility], [address], [place], [place] [address]  Culture   Final    NO GROWTH Performed at [facility], [place] [address] [place], [place] [address]    Report Status 01/16/2019 FINAL  Final  Culture, Urine     Status: None   Collection Time: 01/15/19  6:45 PM   Specimen: Urine, Catheterized  Result Value Ref Range Status   Specimen Description   Final    URINE, CATHETERIZED Performed at [place] [facility] [address], [place], [place] [address]    Special Requests   Final    NONE Performed at [facility], [place] [address], [place], [place] [address]    Culture   Final    NO GROWTH Performed at [facility], [place] [address], [place], [place] [address]    Report Status 01/16/2019 FINAL  Final  Culture, respiratory     Status: None   Collection Time: 01/15/19  6:45 PM   Specimen: Tracheal Aspirate  Result Value Ref Range Status   Specimen Description   Final    TRACHEAL ASPIRATE Performed at [place] [address] [place], [place] [address]    Special Requests   Final    NONE Performed at [facility], [place] [address], [place], [place] [address]    Gram Stain   Final    RARE WBC PRESENT, PREDOMINANTLY PMN ABUNDANT GRAM POSITIVE COCCI IN PAIRS IN CLUSTERS ABUNDANT GRAM NEGATIVE RODS FEW GRAM POSITIVE RODS Performed at [facility], [place] [address] [place], [place] [address]    Culture FEW STAPHYLOCOCCUS AUREUS  Final   Report Status 01/19/2019 FINAL  Final   Organism ID, Bacteria STAPHYLOCOCCUS AUREUS  Final      Susceptibility   Staphylococcus aureus - MIC*    CIPROFLOXACIN <=0.5 SENSITIVE Sensitive     ERYTHROMYCIN <=0.25 SENSITIVE Sensitive     GENTAMICIN <=0.5 SENSITIVE Sensitive     OXACILLIN 0.5 SENSITIVE Sensitive     TETRACYCLINE <=1 SENSITIVE  Sensitive     VANCOMYCIN 1 SENSITIVE Sensitive     TRIMETH/SULFA <=10 SENSITIVE Sensitive     CLINDAMYCIN <=0.25 SENSITIVE Sensitive     RIFAMPIN <=0.5 SENSITIVE Sensitive     Inducible Clindamycin NEGATIVE Sensitive     * FEW STAPHYLOCOCCUS AUREUS  Culture, blood (routine x 2)     Status: None (Preliminary result)   Collection Time: 01/21/19  2:27 PM   Specimen: BLOOD  Result Value Ref Range Status   Specimen Description   Final    BLOOD RIGHT ARM Performed at [place] [address], [place], [place] [address]    Special Requests   Final    BOTTLES DRAWN AEROBIC ONLY Blood Culture results may not be optimal due to an inadequate volume of blood received in culture bottles Performed at [facility] [address], [place], [place] [address]    Culture   Final    NO GROWTH 2 DAYS Performed at [place] [address] [place], [place] [address]    Report Status PENDING  Incomplete  Culture, blood (routine x 2)     Status: None (Preliminary result)   Collection Time: 01/21/19  2:31 PM   Specimen: BLOOD  Result Value Ref Range Status   Specimen Description   Final    BLOOD RIGHT HAND Performed at [facility] [place] [address] [place], [place] [address]    Special Requests   Final  BOTTLES DRAWN AEROBIC ONLY Blood Culture adequate volume Performed at [facility] [address], [place], [place] [address]    Culture   Final    NO GROWTH 2 DAYS Performed at [facility] [address] [place], [place] [address]    Report Status PENDING  Incomplete    Radiology Reports CT ABDOMEN PELVIS WO CONTRAST  Result Date: 01/15/2019 CLINICAL DATA:  Abdominal pain and fever. Abscess suspected. COVID-19 pneumonia. EXAM: CT CHEST, ABDOMEN AND PELVIS WITHOUT CONTRAST TECHNIQUE: Multidetector CT imaging of the chest, abdomen and pelvis was performed following the standard protocol without  IV contrast. COMPARISON:  One-view chest x-ray 01/15/2019 FINDINGS: CT CHEST FINDINGS Cardiovascular: The heart size is normal. Scratched at the heart size upper limits of normal. Atherosclerotic calcifications are noted at the aortic valve and aortic arch. There is no aneurysm. Pulmonary artery size is normal. Mediastinum/Nodes: No significant mediastinal hilar, or axillary adenopathy is present. Esophagus is within normal limits. Thoracic inlet is normal. Lungs/Pleura: Bilateral lower lobe airspace consolidation is present. Additional patchy airspace disease is present in the superior segment of both lower lobes and in the right middle lobe. Mild patchy airspace opacities are present right upper lobe as well. Musculoskeletal: Vertebral body heights are maintained. No focal lytic or blastic lesions are present. CT ABDOMEN PELVIS FINDINGS Hepatobiliary: There is diffuse fatty infiltration of the liver. No discrete lesions are present. The common bile duct and gallbladder are within normal limits. Pancreas: Mild fatty infiltration of the pancreas is present. No discrete lesions are present. Spleen: Normal in size without focal abnormality. Adrenals/Urinary Tract: The adrenal glands are normal bilaterally. Kidneys are unremarkable. There is no stone or mass lesion. No obstruction is present. The ureters are within normal limits. The urinary bladder is decompressed with a Foley catheter in place. Stomach/Bowel: The stomach and duodenum are within limits. Small bowel is unremarkable. Terminal ileum is normal. Appendix is visualized and within limits. The ascending and transverse colon are normal. Descending and sigmoid colon are normal. Vascular/Lymphatic: Atherosclerotic changes are present in the aorta and branch vessels. Reproductive: Prostate is unremarkable. Other: No abdominal wall hernia or abnormality. No abdominopelvic ascites. Focal fluid collection present to suggest abscess Musculoskeletal: Endplate changes  are present at L5-S1. Vertebral body heights are maintained. There is some straightening of lumbar lordosis. Bony pelvis is within normal limits. Hips are located and within normal limits. IMPRESSION: 1. Bilateral lower lobe and right middle lobe airspace disease compatible with multifocal pneumonia. There is likely involvement right upper lobe as well. 2. Hepatic steatosis. 3. No other acute or focal abnormality to explain the patient's abdominal pain. 4. Degenerative changes in the lower lumbar spine. 5. Aortic Atherosclerosis (ICD10-I70.0). Electronically Signed   By: San Morelle M.D.   On: 01/15/2019 16:34   CT HEAD WO CONTRAST  Result Date: 01/21/2019 CLINICAL DATA:  Focal neuro deficit greater than 6 hours. EXAM: CT HEAD WITHOUT CONTRAST TECHNIQUE: Contiguous axial images were obtained from the base of the skull through the vertex without intravenous contrast. COMPARISON:  None. FINDINGS: Brain: Age advanced cerebral atrophy, mild ventriculomegaly and mild periventricular white matter changes. The ventricles are in the midline without mass effect or shift. They are normal in configuration. No extra-axial fluid collections are identified. No CT findings to suggest hemispheric infarction or intracranial hemorrhage. No mass lesions. The brainstem and cerebellum are grossly normal. Vascular: Age advanced vascular calcifications but no hyperdense vessels or obvious aneurysm. Skull: No skull fracture or bone  lesion. Sinuses/Orbits: The paranasal sinuses and mastoid air cells are clear. The globes are intact. Other: Scattered scalp calcifications.  No lesions or hematoma. IMPRESSION: 1. Age advanced cerebral atrophy, ventriculomegaly and periventricular white matter disease. 2. No acute intracranial findings or mass lesions. Electronically Signed   By: Marijo Sanes M.D.   On: 01/21/2019 11:04   CT SOFT TISSUE NECK WO CONTRAST  Result Date: 01/15/2019 CLINICAL DATA:  COVID-19 positive.  Sepsis.   Renal insufficiency EXAM: CT NECK WITHOUT CONTRAST TECHNIQUE: Multidetector CT imaging of the neck was performed following the standard protocol without intravenous contrast. COMPARISON:  None. FINDINGS: Pharynx and larynx: Image quality degraded by motion. Normal airway.  No mass or abscess in the larynx. Salivary glands: No inflammation, mass, or stone. Thyroid: Negative Lymph nodes: No enlarged lymph nodes in the neck. Vascular: Limited vascular evaluation without intravenous contrast. Limited intracranial: Negative Visualized orbits: Negative Mastoids and visualized paranasal sinuses: Paranasal sinuses clear. Small osteoma left frontal sinus. Skeleton: Cervical spondylosis.  No acute skeletal abnormality. Upper chest: Negative Other: None IMPRESSION: No acute abnormality Exam limited by motion and lack of intravenous contrast. Electronically Signed   By: Franchot Gallo M.D.   On: 01/15/2019 16:34   CT CHEST WO CONTRAST  Result Date: 01/15/2019 CLINICAL DATA:  Abdominal pain and fever. Abscess suspected. COVID-19 pneumonia. EXAM: CT CHEST, ABDOMEN AND PELVIS WITHOUT CONTRAST TECHNIQUE: Multidetector CT imaging of the chest, abdomen and pelvis was performed following the standard protocol without IV contrast. COMPARISON:  One-view chest x-ray 01/15/2019 FINDINGS: CT CHEST FINDINGS Cardiovascular: The heart size is normal. Scratched at the heart size upper limits of normal. Atherosclerotic calcifications are noted at the aortic valve and aortic arch. There is no aneurysm. Pulmonary artery size is normal. Mediastinum/Nodes: No significant mediastinal hilar, or axillary adenopathy is present. Esophagus is within normal limits. Thoracic inlet is normal. Lungs/Pleura: Bilateral lower lobe airspace consolidation is present. Additional patchy airspace disease is present in the superior segment of both lower lobes and in the right middle lobe. Mild patchy airspace opacities are present right upper lobe as well.  Musculoskeletal: Vertebral body heights are maintained. No focal lytic or blastic lesions are present. CT ABDOMEN PELVIS FINDINGS Hepatobiliary: There is diffuse fatty infiltration of the liver. No discrete lesions are present. The common bile duct and gallbladder are within normal limits. Pancreas: Mild fatty infiltration of the pancreas is present. No discrete lesions are present. Spleen: Normal in size without focal abnormality. Adrenals/Urinary Tract: The adrenal glands are normal bilaterally. Kidneys are unremarkable. There is no stone or mass lesion. No obstruction is present. The ureters are within normal limits. The urinary bladder is decompressed with a Foley catheter in place. Stomach/Bowel: The stomach and duodenum are within limits. Small bowel is unremarkable. Terminal ileum is normal. Appendix is visualized and within limits. The ascending and transverse colon are normal. Descending and sigmoid colon are normal. Vascular/Lymphatic: Atherosclerotic changes are present in the aorta and branch vessels. Reproductive: Prostate is unremarkable. Other: No abdominal wall hernia or abnormality. No abdominopelvic ascites. Focal fluid collection present to suggest abscess Musculoskeletal: Endplate changes are present at L5-S1. Vertebral body heights are maintained. There is some straightening of lumbar lordosis. Bony pelvis is within normal limits. Hips are located and within normal limits. IMPRESSION: 1. Bilateral lower lobe and right middle lobe airspace disease compatible with multifocal pneumonia. There is likely involvement right upper lobe as well. 2. Hepatic steatosis. 3. No other acute or focal abnormality to explain the patient's abdominal  pain. 4. Degenerative changes in the lower lumbar spine. 5. Aortic Atherosclerosis (ICD10-I70.0). Electronically Signed   By: San Morelle M.D.   On: 01/15/2019 16:34   CT LUMBAR SPINE WO CONTRAST  Result Date: 01/17/2019 CLINICAL DATA:  Right lumbosacral  pain radiating to the leg. Low back pain with increased fracture risk EXAM: CT LUMBAR SPINE WITHOUT CONTRAST TECHNIQUE: Multidetector CT imaging of the lumbar spine was performed without intravenous contrast administration. Multiplanar CT image reconstructions were also generated. COMPARISON:  None. FINDINGS: Segmentation: 5 lumbar type vertebrae Alignment: Normal Vertebrae: No evidence of fracture, discitis, or aggressive bone lesion Paraspinal and other soft tissues: Bilateral pneumonia in this patient with known COVID-19. Disc levels: Disc narrowing and endplate degeneration at L4-5. Mild facet spurring at L4-5 and below IMPRESSION: 1. No acute finding in the lumbar spine. 2. L4-5 moderate disc degeneration. Electronically Signed   By: Monte Fantasia M.D.   On: 01/17/2019 09:40   CT PELVIS WO CONTRAST  Result Date: 01/17/2019 CLINICAL DATA:  Pelvic fracture. EXAM: CT PELVIS WITHOUT CONTRAST TECHNIQUE: Multidetector CT imaging of the pelvis was performed following the standard protocol without intravenous contrast. COMPARISON:  January 15, 2019. FINDINGS: Urinary Tract: Urinary bladder is decompressed secondary to Foley catheter. Bowel:  Unremarkable visualized pelvic bowel loops. Vascular/Lymphatic: Atherosclerosis of visualized abdominal aorta and iliac arteries is noted. No adenopathy is noted. Reproductive:  No mass or other significant abnormality Other:  None. Musculoskeletal: No suspicious bone lesions identified. IMPRESSION: No definite evidence of pelvic fracture or other significant bony abnormality. Aortic Atherosclerosis (ICD10-I70.0). Electronically Signed   By: Marijo Conception M.D.   On: 01/17/2019 09:45   MR BRAIN WO CONTRAST  Result Date: 01/23/2019 CLINICAL DATA:  Encephalopathy.  Coronavirus infection. EXAM: MRI HEAD WITHOUT CONTRAST TECHNIQUE: Multiplanar, multiecho pulse sequences of the brain and surrounding structures were obtained without intravenous contrast. COMPARISON:  Head CT  01/21/2019 FINDINGS: Brain: Diffusion imaging does not show any acute or subacute infarction or other cause of restricted diffusion. There are chronic small-vessel ischemic changes of the pons in the cerebral hemispheric white matter. No cortical or large vessel territory insult. No mass lesion, hemorrhage, hydrocephalus or extra-axial collection. Vascular: Major vessels at the base of the brain show flow. Skull and upper cervical spine: Negative Sinuses/Orbits: Clear/normal Other: None IMPRESSION: No acute or reversible finding. Atrophy and chronic small-vessel ischemic changes affecting the brain. No finding attributable to coronavirus infection. Electronically Signed   By: Nelson Chimes M.D.   On: 01/23/2019 13:39   MR CERVICAL SPINE WO CONTRAST  Result Date: 01/23/2019 CLINICAL DATA:  Enephalopathy.  Fevers.  Coronavirus infection. EXAM: MRI CERVICAL SPINE WITHOUT CONTRAST TECHNIQUE: Multiplanar, multisequence MR imaging of the cervical spine was performed. No intravenous contrast was administered. COMPARISON:  None. FINDINGS: Alignment: Straightening of the normal cervical lordosis. Vertebrae: No acute bone finding. Chronic discogenic endplate marrow changes. No active edema. Cord: No cord compression or primary cord lesion. Posterior Fossa, vertebral arteries, paraspinal tissues: Negative Disc levels: No significant finding at C3-4 or above. C4-5: Mild bulging of the disc. No compressive canal or foraminal narrowing. C5-6: Bulging of the disc. Bilateral uncovertebral prominence right worse than left. Bilateral foraminal narrowing could affect either C6 nerve. C6-7: Mild bulging of the disc.  No compressive stenosis. C7-T1: Normal interspace. IMPRESSION: No acute finding. No cord compression or cord lesion. No finding attributable to coronavirus infection. Degenerative cervical spondylosis as above. Electronically Signed   By: Nelson Chimes M.D.   On: 01/23/2019 13:42  MR THORACIC SPINE WO  CONTRAST  Result Date: 01/23/2019 CLINICAL DATA:  Back pain.  Coronavirus infection. EXAM: MRI THORACIC SPINE WITHOUT CONTRAST TECHNIQUE: Multiplanar, multisequence MR imaging of the thoracic spine was performed. No intravenous contrast was administered. COMPARISON:  CT 01/15/2019 FINDINGS: Alignment:  Normal Vertebrae: No fracture or primary bone lesion. Cord:  No cord compression or primary cord lesion. Paraspinal and other soft tissues: No visible pleural effusions. Disc levels: No significant disc or disc level pathology. No stenosis of the canal or foramina. No significant facet arthropathy. IMPRESSION: Negative study of the thoracic spine. No abnormality seen to explain pain. No sign of spinal infection. Electronically Signed   By: Nelson Chimes M.D.   On: 01/23/2019 13:45   MR LUMBAR SPINE WO CONTRAST  Result Date: 01/23/2019 CLINICAL DATA:  Spinal stenosis.  Coronavirus infection.  Fever. EXAM: MRI LUMBAR SPINE WITHOUT CONTRAST TECHNIQUE: Multiplanar, multisequence MR imaging of the lumbar spine was performed. No intravenous contrast was administered. COMPARISON:  CT 01/17/2019 FINDINGS: Segmentation:  5 lumbar type vertebral bodies. Alignment:  Normal Vertebrae: Chronic discogenic endplate marrow changes at L4-5. Otherwise negative. Conus medullaris and cauda equina: Conus extends to the L1-2 level. Conus and cauda equina appear normal. Paraspinal and other soft tissues: Negative Disc levels: No abnormality at L3-4 or above. L4-5: Chronic disc degeneration with loss of disc height, endplate osteophytes and bulging of the disc. Mild narrowing of the lateral recesses and foramina but no visible neural compression. L5-S1: No disc abnormality. Mild facet osteoarthritis. No stenosis. IMPRESSION: Chronic degenerative disc disease at L4-5. Mild narrowing of the lateral recesses and foramina but no visible neural compression. L5-S1: Mild facet osteoarthritis.  No stenosis. No finding to suggest acute  regional infection. Electronically Signed   By: Nelson Chimes M.D.   On: 01/23/2019 13:44   CXR am  Result Date: 01/21/2019 CLINICAL DATA:  Shortness of breath EXAM: PORTABLE CHEST 1 VIEW COMPARISON:  01/15/2019 FINDINGS: Extensive bilateral airspace disease, progressed. Cardiomegaly that is chronic. No suspected pleural effusion. No pneumothorax. IMPRESSION: Extensive and progressive bilateral pneumonia. Electronically Signed   By: Monte Fantasia M.D.   On: 01/21/2019 09:53   CXR am  Result Date: 01/15/2019 CLINICAL DATA:  Acute shortness of breath. Follow-up COVID-19 pneumonia. EXAM: PORTABLE CHEST 1 VIEW 11:16 a.m.: COMPARISON:  Portable chest x-ray earlier same day at 2:55 a.m. FINDINGS: Cardiac silhouette enlarged for AP portable technique, unchanged. Progressive airspace consolidation involving the lower lobes and the RIGHT MIDDLE LOBE since the examination earlier this morning. Streaky opacities in the lingula. UPPER lobes remain clear otherwise. Normal pulmonary vascularity. IMPRESSION: 1. Progressive bilateral lower lobe pneumonia since the examination earlier this morning. 2. Streaky opacities in the lingula, likely atelectasis. 3. Stable cardiomegaly without pulmonary edema. Electronically Signed   By: Evangeline Dakin M.D.   On: 01/15/2019 11:26   DG Chest Port 1 View  Result Date: 01/15/2019 CLINICAL DATA:  Shortness of breath. EXAM: PORTABLE CHEST 1 VIEW COMPARISON:  None. FINDINGS: The heart is enlarged. Patchy right greater than left airspace opacities in the lung bases. No pulmonary edema. No definite pleural effusion. No pneumothorax. No acute osseous abnormalities are seen. IMPRESSION: 1. Right greater than left basilar airspace opacities, suspicious for pneumonia. 2. Mild cardiomegaly. Electronically Signed   By: Keith Rake M.D.   On: 01/15/2019 03:19   ECHOCARDIOGRAM COMPLETE  Result Date: 01/16/2019   ECHOCARDIOGRAM REPORT   Patient Name:   PAOLA FLYNT Date of Exam:  01/16/2019 Medical Rec #:  [id number]    Height: Accession #:    [id number]   Weight:       283.1 lb Date of Birth:  04-Jun-1955   BSA:          2.55 m Patient Age:    16 years     BP:           155/98 mmHg Patient Gender: M            HR:           105 bpm. Exam Location:  Inpatient Procedure: 2D Echo Indications:    Chest Pain 786.50 / R07.9  History:        Patient has no prior history of Echocardiogram examinations.                 Risk Factors:Hypertension and Diabetes. COVID 19.  Sonographer:    Darlina Sicilian RDCS Referring Phys: Graylin Shiver [facility]  Sonographer Comments: Echo performed with the patient sitting upright in his chair IMPRESSIONS  1. Left ventricular ejection fraction, by visual estimation, is 60 to 65%. The left ventricle has normal function. There is mildly increased left ventricular hypertrophy.  2. Left ventricular diastolic parameters are consistent with Grade I diastolic dysfunction (impaired relaxation).  3. The left ventricle has no regional wall motion abnormalities.  4. Global right ventricle has normal systolic function.The right ventricular size is normal.  5. Left atrial size was normal.  6. Right atrial size was normal.  7. Mild mitral annular calcification.  8. The mitral valve is normal in structure. No evidence of mitral valve regurgitation. No evidence of mitral stenosis.  9. The tricuspid valve is normal in structure. Tricuspid valve regurgitation is mild. 10. The aortic valve has an indeterminant number of cusps. Aortic valve regurgitation is not visualized. Mild to moderate aortic valve sclerosis/calcification without any evidence of aortic stenosis. 11. The pulmonic valve was not well visualized. Pulmonic valve regurgitation is not visualized. 12. Technically difficult; normal LV systolic function; grade 1 diastolic dysfunction; mild LVH with mild proximal septal thickening; calcified aortic valve (not well interrogated but no clear AS by doppler). FINDINGS  Left  Ventricle: Left ventricular ejection fraction, by visual estimation, is 60 to 65%. The left ventricle has normal function. The left ventricle has no regional wall motion abnormalities. There is mildly increased left ventricular hypertrophy. Left ventricular diastolic parameters are consistent with Grade I diastolic dysfunction (impaired relaxation). Normal left atrial pressure. Right Ventricle: The right ventricular size is normal.Global RV systolic function is has normal systolic function. Left Atrium: Left atrial size was normal in size. Right Atrium: Right atrial size was normal in size Pericardium: There is no evidence of pericardial effusion. Mitral Valve: The mitral valve is normal in structure. Mild mitral annular calcification. No evidence of mitral valve regurgitation. No evidence of mitral valve stenosis by observation. Tricuspid Valve: The tricuspid valve is normal in structure. Tricuspid valve regurgitation is mild. Aortic Valve: The aortic valve has an indeterminant number of cusps. Aortic valve regurgitation is not visualized. Mild to moderate aortic valve sclerosis/calcification is present, without any evidence of aortic stenosis. Pulmonic Valve: The pulmonic valve was not well visualized. Pulmonic valve regurgitation is not visualized. Pulmonic regurgitation is not visualized. Aorta: The aortic root is normal in size and structure. Venous: The inferior vena cava was not well visualized.  Additional Comments: Technically difficult; normal LV systolic function; grade 1 diastolic dysfunction; mild LVH with mild proximal septal thickening; calcified aortic valve (not well  interrogated but no clear AS by doppler).  LEFT VENTRICLE PLAX 2D LVIDd:         3.78 cm  Diastology LVIDs:         3.04 cm  LV e' lateral:   10.60 cm/s LV PW:         0.90 cm  LV E/e' lateral: 9.0 LV IVS:        1.60 cm  LV e' medial:    6.96 cm/s LVOT diam:     1.90 cm  LV E/e' medial:  13.7 LV SV:         25 ml LVOT Area:     2.84  cm  LEFT ATRIUM             Index LA diam:        3.50 cm 1.37 cm/m LA Vol (A2C):   64.9 ml 25.50 ml/m LA Vol (A4C):   37.7 ml 14.81 ml/m LA Biplane Vol: 54.7 ml 21.49 ml/m  AORTIC VALVE LVOT Vmax:   99.20 cm/s LVOT Vmean:  59.300 cm/s LVOT VTI:    0.156 m  AORTA Ao Root diam: 3.10 cm MITRAL VALVE                         TRICUSPID VALVE MV Area (PHT): 7.09 cm              TR Peak grad:   35.0 mmHg MV PHT:        31.03 msec            TR Vmax:        296.00 cm/s MV Decel Time: 107 msec MV E velocity: 95.50 cm/s  103 cm/s  SHUNTS MV A velocity: 120.00 cm/s 70.3 cm/s Systemic VTI:  0.16 m MV E/A ratio:  0.80        1.5       Systemic Diam: 1.90 cm  Kirk Ruths MD Electronically signed by Kirk Ruths MD Signature Date/Time: 01/16/2019/1:35:47 PM    Final

## 2019-01-24 NOTE — Progress Notes (Signed)
Insulin Education  Insulin book provided. Showed the patient how to check his blood sugar, using teach back method. Called [facility] to provide glucometer.  Discussed the difference in long acting and short acting insulin. Showed patient correct injection sites and explained the importance of rotating injection sites.  Taught signs and symptoms of a low blood sugar.  Showed him a sliding scale chart and he verbalized how to determine the amount of sliding scale insulin he should receive based on sliding scale.   Patient is aware the diabetic coordinator will be giving him a call, phone was placed beside the patient.

## 2019-01-24 NOTE — Plan of Care (Signed)
Patient had a good day, patient oob to chair for most of day.  Sat's and VS stable.   Encouraged patient to use IS.  Patient still very unsteady gait but was able to ambulate from bed walk around bed to chair with stand by assist in ready to move stance.   Patient cooperative and pleasant.

## 2019-01-25 LAB — GLUCOSE, CAPILLARY
Glucose-Capillary: 130 mg/dL — ABNORMAL HIGH (ref 70–99)
Glucose-Capillary: 137 mg/dL — ABNORMAL HIGH (ref 70–99)
Glucose-Capillary: 139 mg/dL — ABNORMAL HIGH (ref 70–99)
Glucose-Capillary: 158 mg/dL — ABNORMAL HIGH (ref 70–99)
Glucose-Capillary: 244 mg/dL — ABNORMAL HIGH (ref 70–99)
Glucose-Capillary: 264 mg/dL — ABNORMAL HIGH (ref 70–99)
Glucose-Capillary: 299 mg/dL — ABNORMAL HIGH (ref 70–99)

## 2019-01-25 LAB — PROCALCITONIN: Procalcitonin: 0.53 ng/mL

## 2019-01-25 MED ORDER — INSULIN GLARGINE 100 UNIT/ML ~~LOC~~ SOLN
30.0000 [IU] | Freq: Two times a day (BID) | SUBCUTANEOUS | Status: DC
  Administered 2019-01-25 – 2019-01-27 (×6): 30 [IU] via SUBCUTANEOUS
  Filled 2019-01-25 (×8): qty 0.3

## 2019-01-25 NOTE — Progress Notes (Signed)
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 PMH includes HTN, DM.    PT Comments    Pt making progress with mobility was better able to tolerate tx with increased activity tolerance. He does state he is anxious about going home with family tomorrow, therapist assured pt staff will give all instructions to him and family prior to d/c. Pt at mod I with bed mob and transfers, was able to ambulate approx 124ft in hall with RW and SBA, pt remained on 4L/min and sats fluctuated while ambulating min desat noted was 84%. With cues for pursed lip breathing pt able to recover saturations to high 80s. Worked on breathing exercises with flutter valve, pt was not too happy with this. Overall making great progress towards goals.   Follow Up Recommendations  Home health PT;Supervision for mobility/OOB     Equipment Recommendations  Rolling walker with 5" wheels    Recommendations for Other Services       Precautions / Restrictions Precautions Precautions: Fall Precaution Comments: 4L/min via HFNC Restrictions Weight Bearing Restrictions: No    Mobility  Bed Mobility Overal bed mobility: Modified Independent                Transfers Overall transfer level: Needs assistance Equipment used: Rolling walker (2 wheeled) Transfers: Sit to/from [employer] Transfers Sit to Stand: Supervision Stand pivot transfers: Supervision          Ambulation/Gait Ambulation/Gait assistance: Supervision Gait Distance (Feet): 160 Feet Assistive device: Rolling walker (2 wheeled) Gait Pattern/deviations: Step-through pattern     General Gait Details: ambulated in hall approx 174ft w/ RW and SBA on 4L/min via  HFNC pt desat to min 84%   Stairs             Wheelchair Mobility    Modified Rankin (Stroke Patients Only)       Balance Overall balance assessment: Needs assistance Sitting-balance support: Feet supported Sitting balance-Leahy Scale: Good Sitting balance - Comments: EOB unuspported no lobs   Standing balance support: During functional activity;Bilateral upper extremity supported Standing balance-Leahy Scale: Fair                              Cognition Arousal/Alertness: Awake/alert Behavior During Therapy: WFL for tasks assessed/performed(more engaging this session) Overall Cognitive Status: Within Functional Limits for tasks assessed                                        Exercises Other Exercises Other Exercises: flutter valve x 10    General Comments        Pertinent Vitals/Pain Pain Assessment: No/denies pain    Home Living                      Prior Function            PT Goals (current goals can now be found in the care plan section) Acute Rehab PT Goals Patient Stated Goal: going home tomorrow, worried about keeping family safe PT Goal Formulation: With patient Time For Goal Achievement:  01/30/19 Potential to Achieve Goals: Good Progress towards PT goals: Progressing toward goals    Frequency    Min 3X/week      PT Plan Current plan remains appropriate    Co-evaluation              AM-PAC PT "6 Clicks" Mobility   Outcome Measure  Help needed turning from your back to your side while in a flat bed without using bedrails?: None Help needed moving from lying on your back to sitting on the side of a flat bed without using bedrails?: None Help needed moving to and from a bed to a chair (including a wheelchair)?: None Help needed standing up from a chair using your arms (e.g., wheelchair or bedside chair)?: A Little Help needed to walk in hospital room?: A Little Help needed climbing 3-5  steps with a railing? : A Lot 6 Click Score: 20    End of Session Equipment Utilized During Treatment: Oxygen Activity Tolerance: Patient limited by fatigue;Patient tolerated treatment well Patient left: in chair;with call bell/phone within reach   PT Visit Diagnosis: Other abnormalities of gait and mobility (R26.89)     Time: 1421-1450 PT Time Calculation (min) (ACUTE ONLY): 29 min  Charges:  $Gait Training: 8-22 mins $Therapeutic Exercise: 8-22 mins                     Drema Pry, PT    Freddi Starr 01/25/2019, 4:23 PM

## 2019-01-25 NOTE — Progress Notes (Signed)
Patient remains withdrawn and depressed.  Assisted him to the bathroom today and he had a large B/M.  Bathed patient with CHG wipes and gave full linen and gown change.  Patient ambulates well with front wheel walker.  He is adamant about returning home tomorrow "Sunday" with his family who is driving up from [place].  Patient consumed breakfast lunch and dinner.  Complains of no pain.

## 2019-01-25 NOTE — Progress Notes (Signed)
PROGRESS NOTE                                                                                                                                                                                                             Patient Demographics:    Austin Shannon, is a 63 y.o. male, DOB - Nov 04, 1955, MBT:[id number]  Outpatient Primary MD for the patient is Patient, No Pcp Per    LOS - 10  Admit date - 01/15/2019    CC - AMS     Brief Narrative -  Austin Shannon  is a 63 y.o. 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   Subjective:   No acute issues or events overnight, patient states he feels moderately improved from previous, continues to request discharge as his family is now in town, we continue to explain to the patient that given his ongoing moderate hypoxia he cannot be discharged just yet.  Patient found early this morning off oxygen walking to the restroom and back sats found to be in the mid to high 70s after placed back on pulse ox.   Assessment  & Plan :    Acute hypoxic respiratory failure 2/2 sepsis, COVID-19 pneumonia, with concurrent bacterial infection (likely aspiration pneumonia). Extremely high procalcitonin in the range of 100, elevated lactic acid, elevated creatinine and high CRP.  He also has history of treated TB in the past.  Initially placed on IV remdesivir, IV steroids, IV empiric antibiotics (Unasyn and doxycycline).  Unasyn stop date 01/24/2019,  doxycycline stop date 01/29/2019 Imaging to date negative for acute infectious process other than chest x-ray concerning for pneumonia as above  Continue to monitor blood cultures.  Respiratory cultures growing few colonies of staph aureus and he is already on doxycycline. SpO2: 91 % O2 Flow Rate (L/min): 4 L/min  Recent Labs  Lab 01/19/19 0609 01/19/19 0610 01/21/19 0440 01/22/19 0135 01/23/19 0305 01/24/19 0035 01/25/19 0109  CRP  --  11.3* 19.9* 22.5* 12.7* 9.3*  --   DDIMER  1.77*  --  1.42* 1.20* 0.93* 1.03*  --   BNP 167.1*  --  39.6 36.8 28.9 34.6  --   PROCALCITON  --   --  4.00 2.65 1.49 1.01 0.53    Hepatic Function Latest Ref Rng & Units 01/24/2019 01/23/2019 01/22/2019  Total Protein 6.5 - 8.1 g/dL 7.2 7.0 7.4  Albumin 3.5 - 5.0 g/dL 2.6(L) 2.4(L) 2.7(L)  AST 15 - 41 U/L 33 34 34  ALT 0 - 44 U/L 47(H) 43 43  Alk Phosphatase 38 - 126 U/L 60 60 70  Total Bilirubin 0.3 - 1.2 mg/dL 0.7 0.4 0.2(L)    Acute metabolic encephalopathy, recurrent febrile illness; resolved Likely Covid encephalopathy MRI of the head, neck, thoracic and lumbar spine unremarkable as below Discussed with neurology, if patient continues to have mental status changes and fevers would consider LP however at this time given resolution and improvement will hold off on further testing  Dehydration, rhabdomyolysis with AKI and hyperkalemia, resolving.  Continue Foley for now, CT scan nonacute, improving with hydration and Kayexalate, CK has improved. IV fluids stopped, tolerating PO well.  No baseline creatinine in system patient is from [place]. Lab Results  Component Value Date   CREATININE 1.18 01/24/2019   CREATININE 1.36 (H) 01/23/2019   CREATININE 1.54 (H) 01/22/2019    Elevated liver enzymes.  Due to COVID-19 along with dehydration/rhabdomyolysis, completely resolved. Hepatic Function Latest Ref Rng & Units 01/24/2019 01/23/2019 01/22/2019  Total Protein 6.5 - 8.1 g/dL 7.2 7.0 7.4  Albumin  3.5 - 5.0 g/dL 2.6(L) 2.4(L) 2.7(L)  AST 15 - 41 U/L 33 34 34  ALT 0 - 44 U/L 47(H) 43 43  Alk Phosphatase 38 - 126 U/L 60 60 70  Total Bilirubin 0.3 - 1.2 mg/dL 0.7 0.4 0.2(L)    Dyslipidemia. Continue statin.  DM type II, uncontrolled.  Increase Lantus 30BID (previously decreased to 25 with hypoglycemia)  Poor outpatient glycemic control due to hyperglycemia.   Diabetic and insulin education ordered. Continue diabetic diet. Continue sliding scale insulin while on steroids Recent Labs    01/25/19 0413 01/25/19 0857 01/25/19 1152  GLUCAP 139* 130* 244*   Lab Results  Component Value Date   HGBA1C 8.2 (H) 01/15/2019    Borderline elevated troponin.  Not an ACS pattern, chest pain-free, EKG nonacute, aspirin, beta-blocker, stable echo.  Hypertension.  Stable on Coreg and Norvasc.  Dysphagia and odynophagia. Soft tissue neck CT unremarkable, non-contrast due to renal failure, supportive care with antibiotics, throat discomfort has resolved. Speech following and currently on regular diet per speech.  Right-sided lumbosacral discomfort.  Most likely musculoskeletal due to his recent fall at home, CT L-spine and pelvis unremarkable and nonacute. MRI of L spine unremarkable. Supportive care with NSAID cream, much improved.  Intermittent right leg weakness, minimal right arm weakness.  Initially thought to be due to lumbosacral discomfort, worse on 01/21/2019, head CT unremarkable, currently on aspirin and statin.  MRI head, C, T, and L spine unremarkable for acute findings  Fevers, resolved  He was initially febrile when he came in, this resolved after he was started on Covid treatment along with antibiotics for aspiration Concern for intermittent aspirating while supine and eating Will continue Unasyn and continue doxycycline as above.   MRI of Head, C, T, and L spine unremarkable Sputum cultures grew few staph aureus for which she is on doxycycline.  Procalcitonin  trending down appropriately  Mild acute on chronic diastolic CHF EF 07% on echocardiogram.  Appears euvolemic Continue beta-blocker. Adequate urine output  after single dose of Lasix Continue to follow I's and O's - remains net negative  Condition - Guarded Family Communication :  Wife/Brother updated over phone Disposition :  Likely discharge to family in the next 24-72h pending clinical course and improvement in hypoxia. Patient remains high risk for leaving AMA. We have encouraged family not to pick up patient if he requests until we have safely discharged him. Code Status :  DNR Diet :  Diet Order            Diet Carb Modified Fluid consistency: Thin; Room service appropriate? Yes  Diet effective now              Consults  : Neurology Dr. Lorraine Lax Procedures:    MRI Head, C,T, and L spine unremarkable as below  CT soft tissue neck, chest abdomen pelvis.  All noncontrast due to AKI.  Nonacute except for bilateral pneumonia.  CT L-spine and pelvis.  Nonacute.  Echocardiogram:  1. Left ventricular ejection fraction, by visual estimation, is 60 to 65%. The left ventricle has normal function. There is mildly increased left ventricular hypertrophy.  2. Left ventricular diastolic parameters are consistent with Grade I diastolic dysfunction (impaired relaxation).  3. The left ventricle has no regional wall motion abnormalities.  4. Global right ventricle has normal systolic function.The right ventricular size is normal.  5. Left atrial size was normal.  6. Right atrial size was normal.  7. Mild mitral annular calcification.  8. The mitral valve is normal in structure. No evidence of mitral valve regurgitation. No evidence of mitral stenosis.  9. The tricuspid valve is normal in structure. Tricuspid valve regurgitation is mild. 10. The aortic valve has an indeterminant number of cusps. Aortic valve regurgitation is not visualized. Mild to moderate aortic valve sclerosis/calcification  without any evidence of aortic stenosis. 11. The pulmonic valve was not well visualized. Pulmonic valve regurgitation is not visualized. 12. Technically difficult; normal LV systolic function; grade 1 diastolic dysfunction; mild LVH with mild proximal septal thickening; calcified aortic valve (not well interrogated but no clear AS by doppler).   PUD Prophylaxis : PPI  DVT Prophylaxis:  Switch to high-dose prophylactic Lovenox on 01/16/2019  Lab Results  Component Value Date   PLT 469 (H) 01/24/2019    Inpatient Medications  Scheduled Meds: . amLODipine  10 mg Oral Daily  . aspirin EC  325 mg Oral Daily  . atorvastatin  80 mg Oral q1800  . carvedilol  6.25 mg Oral BID  . Chlorhexidine Gluconate Cloth  6 each Topical Daily  . diclofenac Sodium  2 g Topical QID  . docusate sodium  100 mg Oral BID  . doxycycline  100 mg Oral Q12H  . enoxaparin (LOVENOX) injection  65 mg Subcutaneous Q24H  . insulin aspart  0-20 Units Subcutaneous Q4H  . insulin aspart  3 Units Subcutaneous TID WC  . insulin glargine  30 Units Subcutaneous BID  . insulin starter kit- syringes  1 kit Other Once  . LORazepam  1 mg Intravenous Once  . methylPREDNISolone (SOLU-MEDROL) injection  40 mg Intravenous Q12H  . pantoprazole  40 mg Oral BID   Continuous Infusions:  PRN Meds:.acetaminophen, acetaminophen, menthol-cetylpyridinium, metoprolol tartrate, [DISCONTINUED] ondansetron **OR** ondansetron (ZOFRAN) IV, senna, traMADol  Antibiotics  :    Anti-infectives (From admission, onward)   Start     Dose/Rate Route Frequency Ordered Stop   01/21/19 1800  Ampicillin-Sulbactam (UNASYN) 3 g in  sodium chloride 0.9 % 100 mL IVPB     3 g 200 mL/hr over 30 Minutes Intravenous Every 6 hours 01/21/19 1325 01/25/19 0817   01/20/19 2200  doxycycline (VIBRA-TABS) tablet 100 mg     100 mg Oral Every 12 hours 01/20/19 1827 01/29/19 2359   01/16/19 1000  remdesivir 100 mg in sodium chloride 0.9 % 100 mL IVPB  Status:   Discontinued     100 mg 200 mL/hr over 30 Minutes Intravenous Daily 01/15/19 1104 01/15/19 1107   01/16/19 1000  remdesivir 100 mg in sodium chloride 0.9 % 100 mL IVPB     100 mg 200 mL/hr over 30 Minutes Intravenous Daily 01/15/19 1114 01/19/19 1100   01/15/19 1200  remdesivir 200 mg in sodium chloride 0.9% 250 mL IVPB  Status:  Discontinued     200 mg 580 mL/hr over 30 Minutes Intravenous Once 01/15/19 1104 01/15/19 1107   01/15/19 1200  Ampicillin-Sulbactam (UNASYN) 3 g in sodium chloride 0.9 % 100 mL IVPB  Status:  Discontinued     3 g 200 mL/hr over 30 Minutes Intravenous Every 6 hours 01/15/19 1107 01/21/19 1325       Time Spent in minutes  30   Little Ishikawa M.D on 01/25/2019 at 3:33 PM  To page go to [email or web address] - password [facility]  [facility] -  Office  [phone]  See all Orders from today for further details    Objective:   Vitals:   01/25/19 0418 01/25/19 0449 01/25/19 0900 01/25/19 1148  BP: 131/84  127/82 126/78  Pulse: 77  90 80  Resp: (!) 24  (!) 29 (!) 21  Temp: (!) 97.3 F (36.3 C) 98.1 F (36.7 C) 99.1 F (37.3 C) 98.8 F (37.1 C)  TempSrc: Oral Oral Oral Oral  SpO2: 96%  91% 91%  Weight:      Height:        Wt Readings from Last 3 Encounters:  01/15/19 128.4 kg  01/15/19 128.4 kg     Intake/Output Summary (Last 24 hours) at 01/25/2019 1533 Last data filed at 01/25/2019 1300 Gross per 24 hour  Intake 360 ml  Output 1876 ml  Net -1516 ml     Physical Exam  General:  Pleasantly resting in bed, No acute distress.  Somewhat flat mood/affect, alert oriented x4 HEENT:  Normocephalic atraumatic.  Sclerae nonicteric, noninjected.  Extraocular movements intact bilaterally. Neck:  Without mass or deformity.  Trachea is midline. Lungs: Coarse breath sounds bilaterally without overt rhonchi, wheeze, or rales. Heart:  Regular rate and rhythm.  Without murmurs, rubs, or gallops. Abdomen:  Soft, nontender, nondistended.  Without  guarding or rebound. Extremities: Without cyanosis, clubbing, edema, or obvious deformity. Vascular:  Dorsalis pedis and posterior tibial pulses palpable bilaterally. Skin:  Warm and dry, no erythema, no ulcerations.     Data Review:    CBC Recent Labs  Lab 01/19/19 0609 01/20/19 1020 01/21/19 0440 01/22/19 0135 01/23/19 0305 01/24/19 0035  WBC 13.9* 13.0* 15.2* 17.2* 19.8* 20.9*  HGB 12.4* 12.2* 13.7 13.0 12.2* 11.8*  HCT 40.4 38.7* 45.8 43.1 40.5 38.1*  PLT 270 316 412* 441* 483* 469*  MCV 79.5* 78.5* 79.8* 79.7* 80.2 78.9*  MCH 24.4* 24.7* 23.9* 24.0* 24.2* 24.4*  MCHC 30.7 31.5 29.9* 30.2 30.1 31.0  RDW 15.2 15.0 15.1 15.1 15.2 14.9  LYMPHSABS 1.3  --  1.8 1.2 1.4 1.2  MONOABS 1.2*  --  1.3* 1.0 0.9 0.8  EOSABS 0.0  --  0.0 0.0 0.0 0.0  BASOSABS 0.0  --  0.1 0.1 0.1 0.0    Chemistries  Recent Labs  Lab 01/19/19 0609 01/20/19 1020 01/21/19 0440 01/22/19 0135 01/23/19 0305 01/24/19 0035  NA 141 137 140 136 137 138  K 3.9 4.1 4.1 5.1 4.6 4.6  CL 98 94* 94* 94* 95* 99  CO2 33* 33* 34* _0 GLUCOSE 51* 159* 74 352* 185* 154*  BUN 26* 27* 28* 33* 29* 28*  CREATININE 1.40* 1.27* 1.50* 1.54* 1.36* 1.18  CALCIUM 7.6* 7.7* 8.3* 8.1* 7.8* 7.9*  MG 2.5*  --  2.4 2.5*  --   --   AST 37  --  30 34 34 33  ALT 36  --  41 43 43 47*  ALKPHOS 68  --  69 70 60 60  BILITOT 0.9  --  0.5 0.2* 0.4 0.7   ------------------------------------------------------------------------------------------------------------------ No results for input(s): CHOL, HDL, LDLCALC, TRIG, CHOLHDL, LDLDIRECT in the last 72 hours.  Lab Results  Component Value Date   HGBA1C 8.2 (H) 01/15/2019   ------------------------------------------------------------------------------------------------------------------ No results for input(s): TSH, T4TOTAL, T3FREE, THYROIDAB in the last 72 hours.  Invalid input(s): FREET3  Cardiac Enzymes No results for input(s): CKMB, TROPONINI, MYOGLOBIN in the  last 168 hours.  Invalid input(s): CK ------------------------------------------------------------------------------------------------------------------    Component Value Date/Time   BNP 34.6 01/24/2019 0035    Micro Results  Recent Results (from the past 240 hour(s))  Culture, Urine     Status: None   Collection Time: 01/15/19  6:45 PM   Specimen: Urine, Catheterized  Result Value Ref Range Status   Specimen Description   Final    URINE, CATHETERIZED Performed at [facility] [address], [place], [place] [address]    Special Requests   Final    NONE Performed at [facility], [place] [address], [place], [place] [address]    Culture   Final    NO GROWTH Performed at [facility], [place] [address], [place], [place] [address]    Report Status 01/16/2019 FINAL  Final  Culture, respiratory     Status: None   Collection Time: 01/15/19  6:45 PM   Specimen: Tracheal Aspirate  Result Value Ref Range Status   Specimen Description   Final    TRACHEAL ASPIRATE Performed at [facility] [address], [place], [place] [address]    Special Requests   Final    NONE Performed at [facility], [place] [address] [place], [place] [address]    Gram Stain   Final    RARE WBC PRESENT, PREDOMINANTLY PMN ABUNDANT GRAM POSITIVE COCCI IN PAIRS IN CLUSTERS ABUNDANT GRAM NEGATIVE RODS FEW GRAM POSITIVE RODS Performed at [facility], [place] [address], [place], [place] [address]    Culture FEW STAPHYLOCOCCUS AUREUS  Final   Report Status 01/19/2019 FINAL  Final   Organism ID, Bacteria STAPHYLOCOCCUS AUREUS  Final      Susceptibility   Staphylococcus aureus - MIC*    CIPROFLOXACIN <=0.5 SENSITIVE Sensitive     ERYTHROMYCIN <=0.25 SENSITIVE Sensitive     GENTAMICIN <=0.5 SENSITIVE Sensitive     OXACILLIN 0.5 SENSITIVE Sensitive     TETRACYCLINE <=1 SENSITIVE Sensitive     VANCOMYCIN 1 SENSITIVE  Sensitive     TRIMETH/SULFA <=10 SENSITIVE Sensitive     CLINDAMYCIN <=0.25 SENSITIVE Sensitive     RIFAMPIN <=0.5 SENSITIVE Sensitive     Inducible  Clindamycin NEGATIVE Sensitive     * FEW STAPHYLOCOCCUS AUREUS  Culture, blood (routine x 2)     Status: None (Preliminary result)   Collection Time: 01/21/19  2:27 PM   Specimen: BLOOD  Result Value Ref Range Status   Specimen Description   Final    BLOOD RIGHT ARM Performed at [facility] [address] [place], [place] [address]    Special Requests   Final    BOTTLES DRAWN AEROBIC ONLY Blood Culture results may not be optimal due to an inadequate volume of blood received in culture bottles Performed at [facility] [address] [place], [place] [address]    Culture   Final    NO GROWTH 4 DAYS Performed at [facility], [place] [address] [place], [place] [address]    Report Status PENDING  Incomplete  Culture, blood (routine x 2)     Status: None (Preliminary result)   Collection Time: 01/21/19  2:31 PM   Specimen: BLOOD  Result Value Ref Range Status   Specimen Description   Final    BLOOD RIGHT HAND Performed at [facility] [address], [place], [place] [address]    Special Requests   Final    BOTTLES DRAWN AEROBIC ONLY Blood Culture adequate volume Performed at [facility] [address], [place], [place] [address]    Culture   Final    NO GROWTH 4 DAYS Performed at [facility], [place] [address], [place], [place] [address]    Report Status PENDING  Incomplete    Radiology Reports CT ABDOMEN PELVIS WO CONTRAST  Result Date: 01/15/2019 CLINICAL DATA:  Abdominal pain and fever. Abscess suspected. COVID-19 pneumonia. EXAM: CT CHEST, ABDOMEN AND PELVIS WITHOUT CONTRAST TECHNIQUE: Multidetector CT imaging of the chest, abdomen and pelvis was performed following the standard protocol without IV contrast. COMPARISON:  One-view  chest x-ray 01/15/2019 FINDINGS: CT CHEST FINDINGS Cardiovascular: The heart size is normal. Scratched at the heart size upper limits of normal. Atherosclerotic calcifications are noted at the aortic valve and aortic arch. There is no aneurysm. Pulmonary artery size is normal. Mediastinum/Nodes: No significant mediastinal hilar, or axillary adenopathy is present. Esophagus is within normal limits. Thoracic inlet is normal. Lungs/Pleura: Bilateral lower lobe airspace consolidation is present. Additional patchy airspace disease is present in the superior segment of both lower lobes and in the right middle lobe. Mild patchy airspace opacities are present right upper lobe as well. Musculoskeletal: Vertebral body heights are maintained. No focal lytic or blastic lesions are present. CT ABDOMEN PELVIS FINDINGS Hepatobiliary: There is diffuse fatty infiltration of the liver. No discrete lesions are present. The common bile duct and gallbladder are within normal limits. Pancreas: Mild fatty infiltration of the pancreas is present. No discrete lesions are present. Spleen: Normal in size without focal abnormality. Adrenals/Urinary Tract: The adrenal glands are normal bilaterally. Kidneys are unremarkable. There is no stone or mass lesion. No obstruction is present. The ureters are within normal limits. The urinary bladder is decompressed with a Foley catheter in place. Stomach/Bowel: The stomach and duodenum are within limits. Small bowel is unremarkable. Terminal ileum is normal. Appendix is visualized and within limits. The ascending and transverse colon are normal. Descending and sigmoid colon are normal. Vascular/Lymphatic: Atherosclerotic changes are present in the aorta and branch vessels. Reproductive: Prostate is unremarkable. Other: No abdominal wall hernia or abnormality. No abdominopelvic ascites. Focal fluid collection present to suggest abscess  Musculoskeletal: Endplate changes are present at L5-S1. Vertebral  body heights are maintained. There is some straightening of lumbar lordosis. Bony pelvis is within normal limits. Hips are located and within normal limits. IMPRESSION: 1. Bilateral lower lobe and right middle lobe airspace disease compatible with multifocal pneumonia. There is likely involvement right upper lobe as well. 2. Hepatic steatosis. 3. No other acute or focal abnormality to explain the patient's abdominal pain. 4. Degenerative changes in the lower lumbar spine. 5. Aortic Atherosclerosis (ICD10-I70.0). Electronically Signed   By: San Morelle M.D.   On: 01/15/2019 16:34   CT HEAD WO CONTRAST  Result Date: 01/21/2019 CLINICAL DATA:  Focal neuro deficit greater than 6 hours. EXAM: CT HEAD WITHOUT CONTRAST TECHNIQUE: Contiguous axial images were obtained from the base of the skull through the vertex without intravenous contrast. COMPARISON:  None. FINDINGS: Brain: Age advanced cerebral atrophy, mild ventriculomegaly and mild periventricular white matter changes. The ventricles are in the midline without mass effect or shift. They are normal in configuration. No extra-axial fluid collections are identified. No CT findings to suggest hemispheric infarction or intracranial hemorrhage. No mass lesions. The brainstem and cerebellum are grossly normal. Vascular: Age advanced vascular calcifications but no hyperdense vessels or obvious aneurysm. Skull: No skull fracture or bone lesion. Sinuses/Orbits: The paranasal sinuses and mastoid air cells are clear. The globes are intact. Other: Scattered scalp calcifications.  No lesions or hematoma. IMPRESSION: 1. Age advanced cerebral atrophy, ventriculomegaly and periventricular white matter disease. 2. No acute intracranial findings or mass lesions. Electronically Signed   By: Marijo Sanes M.D.   On: 01/21/2019 11:04   CT SOFT TISSUE NECK WO CONTRAST  Result Date: 01/15/2019 CLINICAL DATA:  COVID-19 positive.  Sepsis.  Renal insufficiency EXAM: CT NECK  WITHOUT CONTRAST TECHNIQUE: Multidetector CT imaging of the neck was performed following the standard protocol without intravenous contrast. COMPARISON:  None. FINDINGS: Pharynx and larynx: Image quality degraded by motion. Normal airway.  No mass or abscess in the larynx. Salivary glands: No inflammation, mass, or stone. Thyroid: Negative Lymph nodes: No enlarged lymph nodes in the neck. Vascular: Limited vascular evaluation without intravenous contrast. Limited intracranial: Negative Visualized orbits: Negative Mastoids and visualized paranasal sinuses: Paranasal sinuses clear. Small osteoma left frontal sinus. Skeleton: Cervical spondylosis.  No acute skeletal abnormality. Upper chest: Negative Other: None IMPRESSION: No acute abnormality Exam limited by motion and lack of intravenous contrast. Electronically Signed   By: Franchot Gallo M.D.   On: 01/15/2019 16:34   CT CHEST WO CONTRAST  Result Date: 01/15/2019 CLINICAL DATA:  Abdominal pain and fever. Abscess suspected. COVID-19 pneumonia. EXAM: CT CHEST, ABDOMEN AND PELVIS WITHOUT CONTRAST TECHNIQUE: Multidetector CT imaging of the chest, abdomen and pelvis was performed following the standard protocol without IV contrast. COMPARISON:  One-view chest x-ray 01/15/2019 FINDINGS: CT CHEST FINDINGS Cardiovascular: The heart size is normal. Scratched at the heart size upper limits of normal. Atherosclerotic calcifications are noted at the aortic valve and aortic arch. There is no aneurysm. Pulmonary artery size is normal. Mediastinum/Nodes: No significant mediastinal hilar, or axillary adenopathy is present. Esophagus is within normal limits. Thoracic inlet is normal. Lungs/Pleura: Bilateral lower lobe airspace consolidation is present. Additional patchy airspace disease is present in the superior segment of both lower lobes and in the right middle lobe. Mild patchy airspace opacities are present right upper lobe as well. Musculoskeletal: Vertebral body heights  are maintained. No focal lytic or blastic lesions are present. CT ABDOMEN PELVIS FINDINGS Hepatobiliary: There is  diffuse fatty infiltration of the liver. No discrete lesions are present. The common bile duct and gallbladder are within normal limits. Pancreas: Mild fatty infiltration of the pancreas is present. No discrete lesions are present. Spleen: Normal in size without focal abnormality. Adrenals/Urinary Tract: The adrenal glands are normal bilaterally. Kidneys are unremarkable. There is no stone or mass lesion. No obstruction is present. The ureters are within normal limits. The urinary bladder is decompressed with a Foley catheter in place. Stomach/Bowel: The stomach and duodenum are within limits. Small bowel is unremarkable. Terminal ileum is normal. Appendix is visualized and within limits. The ascending and transverse colon are normal. Descending and sigmoid colon are normal. Vascular/Lymphatic: Atherosclerotic changes are present in the aorta and branch vessels. Reproductive: Prostate is unremarkable. Other: No abdominal wall hernia or abnormality. No abdominopelvic ascites. Focal fluid collection present to suggest abscess Musculoskeletal: Endplate changes are present at L5-S1. Vertebral body heights are maintained. There is some straightening of lumbar lordosis. Bony pelvis is within normal limits. Hips are located and within normal limits. IMPRESSION: 1. Bilateral lower lobe and right middle lobe airspace disease compatible with multifocal pneumonia. There is likely involvement right upper lobe as well. 2. Hepatic steatosis. 3. No other acute or focal abnormality to explain the patient's abdominal pain. 4. Degenerative changes in the lower lumbar spine. 5. Aortic Atherosclerosis (ICD10-I70.0). Electronically Signed   By: San Morelle M.D.   On: 01/15/2019 16:34   CT LUMBAR SPINE WO CONTRAST  Result Date: 01/17/2019 CLINICAL DATA:  Right lumbosacral pain radiating to the leg. Low back pain  with increased fracture risk EXAM: CT LUMBAR SPINE WITHOUT CONTRAST TECHNIQUE: Multidetector CT imaging of the lumbar spine was performed without intravenous contrast administration. Multiplanar CT image reconstructions were also generated. COMPARISON:  None. FINDINGS: Segmentation: 5 lumbar type vertebrae Alignment: Normal Vertebrae: No evidence of fracture, discitis, or aggressive bone lesion Paraspinal and other soft tissues: Bilateral pneumonia in this patient with known COVID-19. Disc levels: Disc narrowing and endplate degeneration at L4-5. Mild facet spurring at L4-5 and below IMPRESSION: 1. No acute finding in the lumbar spine. 2. L4-5 moderate disc degeneration. Electronically Signed   By: Monte Fantasia M.D.   On: 01/17/2019 09:40   CT PELVIS WO CONTRAST  Result Date: 01/17/2019 CLINICAL DATA:  Pelvic fracture. EXAM: CT PELVIS WITHOUT CONTRAST TECHNIQUE: Multidetector CT imaging of the pelvis was performed following the standard protocol without intravenous contrast. COMPARISON:  January 15, 2019. FINDINGS: Urinary Tract: Urinary bladder is decompressed secondary to Foley catheter. Bowel:  Unremarkable visualized pelvic bowel loops. Vascular/Lymphatic: Atherosclerosis of visualized abdominal aorta and iliac arteries is noted. No adenopathy is noted. Reproductive:  No mass or other significant abnormality Other:  None. Musculoskeletal: No suspicious bone lesions identified. IMPRESSION: No definite evidence of pelvic fracture or other significant bony abnormality. Aortic Atherosclerosis (ICD10-I70.0). Electronically Signed   By: Marijo Conception M.D.   On: 01/17/2019 09:45   MR BRAIN WO CONTRAST  Result Date: 01/23/2019 CLINICAL DATA:  Encephalopathy.  Coronavirus infection. EXAM: MRI HEAD WITHOUT CONTRAST TECHNIQUE: Multiplanar, multiecho pulse sequences of the brain and surrounding structures were obtained without intravenous contrast. COMPARISON:  Head CT 01/21/2019 FINDINGS: Brain: Diffusion  imaging does not show any acute or subacute infarction or other cause of restricted diffusion. There are chronic small-vessel ischemic changes of the pons in the cerebral hemispheric white matter. No cortical or large vessel territory insult. No mass lesion, hemorrhage, hydrocephalus or extra-axial collection. Vascular: Major vessels at the base of  the brain show flow. Skull and upper cervical spine: Negative Sinuses/Orbits: Clear/normal Other: None IMPRESSION: No acute or reversible finding. Atrophy and chronic small-vessel ischemic changes affecting the brain. No finding attributable to coronavirus infection. Electronically Signed   By: Nelson Chimes M.D.   On: 01/23/2019 13:39   MR CERVICAL SPINE WO CONTRAST  Result Date: 01/23/2019 CLINICAL DATA:  Enephalopathy.  Fevers.  Coronavirus infection. EXAM: MRI CERVICAL SPINE WITHOUT CONTRAST TECHNIQUE: Multiplanar, multisequence MR imaging of the cervical spine was performed. No intravenous contrast was administered. COMPARISON:  None. FINDINGS: Alignment: Straightening of the normal cervical lordosis. Vertebrae: No acute bone finding. Chronic discogenic endplate marrow changes. No active edema. Cord: No cord compression or primary cord lesion. Posterior Fossa, vertebral arteries, paraspinal tissues: Negative Disc levels: No significant finding at C3-4 or above. C4-5: Mild bulging of the disc. No compressive canal or foraminal narrowing. C5-6: Bulging of the disc. Bilateral uncovertebral prominence right worse than left. Bilateral foraminal narrowing could affect either C6 nerve. C6-7: Mild bulging of the disc.  No compressive stenosis. C7-T1: Normal interspace. IMPRESSION: No acute finding. No cord compression or cord lesion. No finding attributable to coronavirus infection. Degenerative cervical spondylosis as above. Electronically Signed   By: Nelson Chimes M.D.   On: 01/23/2019 13:42   MR THORACIC SPINE WO CONTRAST  Result Date: 01/23/2019 CLINICAL DATA:   Back pain.  Coronavirus infection. EXAM: MRI THORACIC SPINE WITHOUT CONTRAST TECHNIQUE: Multiplanar, multisequence MR imaging of the thoracic spine was performed. No intravenous contrast was administered. COMPARISON:  CT 01/15/2019 FINDINGS: Alignment:  Normal Vertebrae: No fracture or primary bone lesion. Cord:  No cord compression or primary cord lesion. Paraspinal and other soft tissues: No visible pleural effusions. Disc levels: No significant disc or disc level pathology. No stenosis of the canal or foramina. No significant facet arthropathy. IMPRESSION: Negative study of the thoracic spine. No abnormality seen to explain pain. No sign of spinal infection. Electronically Signed   By: Nelson Chimes M.D.   On: 01/23/2019 13:45   MR LUMBAR SPINE WO CONTRAST  Result Date: 01/23/2019 CLINICAL DATA:  Spinal stenosis.  Coronavirus infection.  Fever. EXAM: MRI LUMBAR SPINE WITHOUT CONTRAST TECHNIQUE: Multiplanar, multisequence MR imaging of the lumbar spine was performed. No intravenous contrast was administered. COMPARISON:  CT 01/17/2019 FINDINGS: Segmentation:  5 lumbar type vertebral bodies. Alignment:  Normal Vertebrae: Chronic discogenic endplate marrow changes at L4-5. Otherwise negative. Conus medullaris and cauda equina: Conus extends to the L1-2 level. Conus and cauda equina appear normal. Paraspinal and other soft tissues: Negative Disc levels: No abnormality at L3-4 or above. L4-5: Chronic disc degeneration with loss of disc height, endplate osteophytes and bulging of the disc. Mild narrowing of the lateral recesses and foramina but no visible neural compression. L5-S1: No disc abnormality. Mild facet osteoarthritis. No stenosis. IMPRESSION: Chronic degenerative disc disease at L4-5. Mild narrowing of the lateral recesses and foramina but no visible neural compression. L5-S1: Mild facet osteoarthritis.  No stenosis. No finding to suggest acute regional infection. Electronically Signed   By: Nelson Chimes  M.D.   On: 01/23/2019 13:44   CXR am  Result Date: 01/21/2019 CLINICAL DATA:  Shortness of breath EXAM: PORTABLE CHEST 1 VIEW COMPARISON:  01/15/2019 FINDINGS: Extensive bilateral airspace disease, progressed. Cardiomegaly that is chronic. No suspected pleural effusion. No pneumothorax. IMPRESSION: Extensive and progressive bilateral pneumonia. Electronically Signed   By: Monte Fantasia M.D.   On: 01/21/2019 09:53   CXR am  Result Date: 01/15/2019 CLINICAL DATA:  Acute shortness of breath. Follow-up COVID-19 pneumonia. EXAM: PORTABLE CHEST 1 VIEW 11:16 a.m.: COMPARISON:  Portable chest x-ray earlier same day at 2:55 a.m. FINDINGS: Cardiac silhouette enlarged for AP portable technique, unchanged. Progressive airspace consolidation involving the lower lobes and the RIGHT MIDDLE LOBE since the examination earlier this morning. Streaky opacities in the lingula. UPPER lobes remain clear otherwise. Normal pulmonary vascularity. IMPRESSION: 1. Progressive bilateral lower lobe pneumonia since the examination earlier this morning. 2. Streaky opacities in the lingula, likely atelectasis. 3. Stable cardiomegaly without pulmonary edema. Electronically Signed   By: Evangeline Dakin M.D.   On: 01/15/2019 11:26   DG Chest Port 1 View  Result Date: 01/15/2019 CLINICAL DATA:  Shortness of breath. EXAM: PORTABLE CHEST 1 VIEW COMPARISON:  None. FINDINGS: The heart is enlarged. Patchy right greater than left airspace opacities in the lung bases. No pulmonary edema. No definite pleural effusion. No pneumothorax. No acute osseous abnormalities are seen. IMPRESSION: 1. Right greater than left basilar airspace opacities, suspicious for pneumonia. 2. Mild cardiomegaly. Electronically Signed   By: Keith Rake M.D.   On: 01/15/2019 03:19   ECHOCARDIOGRAM COMPLETE  Result Date: 01/16/2019   ECHOCARDIOGRAM REPORT   Patient Name:   RYANN LEAVITT Date of Exam: 01/16/2019 Medical Rec #:  [id number]    Height: Accession #:     [id number]   Weight:       283.1 lb Date of Birth:  [birth year]   BSA:          2.55 m Patient Age:    43 years     BP:           155/98 mmHg Patient Gender: M            HR:           105 bpm. Exam Location:  Inpatient Procedure: 2D Echo Indications:    Chest Pain 786.50 / R07.9  History:        Patient has no prior history of Echocardiogram examinations.                 Risk Factors:Hypertension and Diabetes. COVID 19.  Sonographer:    Darlina Sicilian RDCS Referring Phys: Graylin Shiver [facility]  Sonographer Comments: Echo performed with the patient sitting upright in his chair IMPRESSIONS  1. Left ventricular ejection fraction, by visual estimation, is 60 to 65%. The left ventricle has normal function. There is mildly increased left ventricular hypertrophy.  2. Left ventricular diastolic parameters are consistent with Grade I diastolic dysfunction (impaired relaxation).  3. The left ventricle has no regional wall motion abnormalities.  4. Global right ventricle has normal systolic function.The right ventricular size is normal.  5. Left atrial size was normal.  6. Right atrial size was normal.  7. Mild mitral annular calcification.  8. The mitral valve is normal in structure. No evidence of mitral valve regurgitation. No evidence of mitral stenosis.  9. The tricuspid valve is normal in structure. Tricuspid valve regurgitation is mild. 10. The aortic valve has an indeterminant number of cusps. Aortic valve regurgitation is not visualized. Mild to moderate aortic valve sclerosis/calcification without any evidence of aortic stenosis. 11. The pulmonic valve was not well visualized. Pulmonic valve regurgitation is not visualized. 12. Technically difficult; normal LV systolic function; grade 1 diastolic dysfunction; mild LVH with mild proximal septal thickening; calcified aortic valve (not well interrogated but no clear AS by doppler). FINDINGS  Left Ventricle: Left ventricular ejection fraction, by visual estimation, is  60 to 65%. The left ventricle has normal function. The left ventricle has no regional wall motion abnormalities. There is mildly increased left ventricular hypertrophy. Left ventricular diastolic parameters are consistent with Grade I diastolic dysfunction (impaired relaxation). Normal left atrial pressure. Right Ventricle: The right ventricular size is normal.Global RV systolic function is has normal systolic function. Left Atrium: Left atrial size was normal in size. Right Atrium: Right atrial size was normal in size Pericardium: There is no evidence of pericardial effusion. Mitral Valve: The mitral valve is normal in structure. Mild mitral annular calcification. No evidence of mitral valve regurgitation. No evidence of mitral valve stenosis by observation. Tricuspid Valve: The tricuspid valve is normal in structure. Tricuspid valve regurgitation is mild. Aortic Valve: The aortic valve has an indeterminant number of cusps. Aortic valve regurgitation is not visualized. Mild to moderate aortic valve sclerosis/calcification is present, without any evidence of aortic stenosis. Pulmonic Valve: The pulmonic valve was not well visualized. Pulmonic valve regurgitation is not visualized. Pulmonic regurgitation is not visualized. Aorta: The aortic root is normal in size and structure. Venous: The inferior vena cava was not well visualized.  Additional Comments: Technically difficult; normal LV systolic function; grade 1 diastolic dysfunction; mild LVH with mild proximal septal thickening; calcified aortic valve (not well interrogated but no clear AS by doppler).  LEFT VENTRICLE PLAX 2D LVIDd:         3.78 cm  Diastology LVIDs:         3.04 cm  LV e' lateral:   10.60 cm/s LV PW:         0.90 cm  LV E/e' lateral: 9.0 LV IVS:        1.60 cm  LV e' medial:    6.96 cm/s LVOT diam:     1.90 cm  LV E/e' medial:  13.7 LV SV:         25 ml LVOT Area:     2.84 cm  LEFT ATRIUM             Index LA diam:        3.50 cm 1.37 cm/m LA  Vol (A2C):   64.9 ml 25.50 ml/m LA Vol (A4C):   37.7 ml 14.81 ml/m LA Biplane Vol: 54.7 ml 21.49 ml/m  AORTIC VALVE LVOT Vmax:   99.20 cm/s LVOT Vmean:  59.300 cm/s LVOT VTI:    0.156 m  AORTA Ao Root diam: 3.10 cm MITRAL VALVE                         TRICUSPID VALVE MV Area (PHT): 7.09 cm              TR Peak grad:   35.0 mmHg MV PHT:        31.03 msec            TR Vmax:        296.00 cm/s MV Decel Time: 107 msec MV E velocity: 95.50 cm/s  103 cm/s  SHUNTS MV A velocity: 120.00 cm/s 70.3 cm/s Systemic VTI:  0.16 m MV E/A ratio:  0.80        1.5       Systemic Diam: 1.90 cm  Kirk Ruths MD Electronically signed by Kirk Ruths MD Signature Date/Time: 01/16/2019/1:35:47 PM    Final

## 2019-01-25 NOTE — Progress Notes (Signed)
Updated wife Reynold Mantell. Answered questions and concerns concerning Mr. Mendibles. Family requested Doctors to reach out to them tomorrow during rounds. No other issues at this time.

## 2019-01-26 LAB — CULTURE, BLOOD (ROUTINE X 2)
Culture: NO GROWTH
Culture: NO GROWTH
Special Requests: ADEQUATE

## 2019-01-26 LAB — GLUCOSE, CAPILLARY
Glucose-Capillary: 182 mg/dL — ABNORMAL HIGH (ref 70–99)
Glucose-Capillary: 99 mg/dL (ref 70–99)
Glucose-Capillary: 256 mg/dL — ABNORMAL HIGH (ref 70–99)
Glucose-Capillary: 274 mg/dL — ABNORMAL HIGH (ref 70–99)
Glucose-Capillary: 336 mg/dL — ABNORMAL HIGH (ref 70–99)

## 2019-01-26 NOTE — Progress Notes (Signed)
Pt received bacon on breakfast tray this AM, called Dietary to ensure they are aware pt does not want pork, Dietary staff stated understanding.

## 2019-01-26 NOTE — Progress Notes (Signed)
PROGRESS NOTE                                                                                                                                                                                                             Patient Demographics:    Austin Shannon, is a 63 y.o. male, DOB - 1955/07/19, JKK:[id number]  Outpatient Primary MD for the patient is Patient, No Pcp Per    LOS - 11  Admit date - 01/15/2019    CC - AMS  Brief Narrative - Austin Shannon is a 63 y.o. 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   Subjective:   No acute issues or events overnight, patient states he feels much better and keeps asking about discharge. Patient today desatted to 79% while ambulating on room air, even with 5 L nasal cannula was still satting at 88%.  Continues to require at minimum 3 L nasal cannula to maintain 88% at rest.  Lengthy discussion with patient's family as well about patient's high risk for leaving before his lungs have healed and his respiratory status has improved enough to support him at discharge.   Assessment  & Plan :    Acute hypoxic respiratory failure 2/2 sepsis, COVID-19 pneumonia, with concurrent bacterial infection (likely aspiration pneumonia). Extremely high procalcitonin in the range of 100, elevated lactic acid, elevated creatinine and high CRP.  He also has history of treated TB in the past.  Initially placed on IV remdesivir, IV steroids, IV empiric antibiotics  (Unasyn and doxycycline).  Unasyn stop date 01/24/2019, doxycycline stop date 01/29/2019 Imaging to date negative for acute infectious process other than chest imaging concerning for pneumonia. Continue to monitor blood cultures. Respiratory cultures growing few colonies of staph aureus and he is already on doxycycline. Walking on room air: 79% Walking on 5LNC: 88% SpO2: (!) 88 % O2 Flow Rate (L/min): 3 L/min(increased to 3)  Recent Labs  Lab 01/21/19 0440 01/22/19 0135 01/23/19 0305 01/24/19 0035 01/25/19 0109  CRP 19.9* 22.5* 12.7* 9.3*  --  DDIMER 1.42* 1.20* 0.93* 1.03*  --   BNP 39.6 36.8 28.9 34.6  --   PROCALCITON 4.00 2.65 1.49 1.01 0.53    Hepatic Function Latest Ref Rng & Units 01/24/2019 01/23/2019 01/22/2019  Total Protein 6.5 - 8.1 g/dL 7.2 7.0 7.4  Albumin 3.5 - 5.0 g/dL 2.6(L) 2.4(L) 2.7(L)  AST 15 - 41 U/L 33 34 34  ALT 0 - 44 U/L 47(H) 43 43  Alk Phosphatase 38 - 126 U/L 60 60 70  Total Bilirubin 0.3 - 1.2 mg/dL 0.7 0.4 0.2(L)    Acute metabolic encephalopathy, recurrent febrile illness; resolved Likely Covid encephalopathy MRI of the head, neck, thoracic and lumbar spine unremarkable as below Discussed with neurology, if patient continues to have mental status changes and fevers would consider LP however at this time given resolution and improvement will hold off on further testing  Dehydration, rhabdomyolysis with AKI and hyperkalemia, resolving.  Continue Foley for now, CT scan nonacute, improving with hydration and Kayexalate, CK has improved. IV fluids stopped, tolerating PO well.  No baseline creatinine in system patient is from [place]. Lab Results  Component Value Date   CREATININE 1.18 01/24/2019   CREATININE 1.36 (H) 01/23/2019   CREATININE 1.54 (H) 01/22/2019    Elevated liver enzymes.  Due to COVID-19 along with dehydration/rhabdomyolysis, completely resolved. Hepatic Function Latest Ref Rng & Units 01/24/2019 01/23/2019 01/22/2019  Total  Protein 6.5 - 8.1 g/dL 7.2 7.0 7.4  Albumin 3.5 - 5.0 g/dL 2.6(L) 2.4(L) 2.7(L)  AST 15 - 41 U/L 33 34 34  ALT 0 - 44 U/L 47(H) 43 43  Alk Phosphatase 38 - 126 U/L 60 60 70  Total Bilirubin 0.3 - 1.2 mg/dL 0.7 0.4 0.2(L)    Dyslipidemia. Continue statin.  DM type II, uncontrolled.  Increase Lantus 30BID (previously decreased to 25 with hypoglycemia)  Poor outpatient glycemic control due to hyperglycemia.   Diabetic and insulin education ordered. Continue diabetic diet. Continue sliding scale insulin while on steroids Recent Labs    01/26/19 0402 01/26/19 0807 01/26/19 1138  GLUCAP 182* 99 256*   Lab Results  Component Value Date   HGBA1C 8.2 (H) 01/15/2019    Borderline elevated troponin.  Not an ACS pattern, chest pain-free, EKG nonacute, aspirin, beta-blocker, stable echo.  Hypertension.  Stable on Coreg and Norvasc.  Dysphagia and odynophagia. Soft tissue neck CT unremarkable, non-contrast due to renal failure, supportive care with antibiotics, throat discomfort has resolved. Speech following and currently on regular diet per speech.  Right-sided lumbosacral discomfort.  Most likely musculoskeletal due to his recent fall at home, CT L-spine and pelvis unremarkable and nonacute. MRI of L spine unremarkable. Supportive care with NSAID cream, much improved.  Intermittent right leg weakness, minimal right arm weakness.  Initially thought to be due to lumbosacral discomfort, worse on 01/21/2019, head CT unremarkable, currently on aspirin and statin.  MRI head, C, T, and L spine unremarkable for acute findings  Fevers, resolved  He was initially febrile when he came in, this resolved after he was started on Covid treatment along with antibiotics for aspiration Concern for intermittent aspirating while supine and eating Will continue Unasyn and continue doxycycline as above.   MRI of Head, C, T, and L spine unremarkable Sputum cultures grew few staph aureus for which  she is on doxycycline.  Procalcitonin trending down appropriately  Mild acute on chronic diastolic CHF EF 56% on echocardiogram.   Appears euvolemic Continue beta-blocker. Adequate urine output  after single dose of  Lasix Continue to follow I's and O's - remains net negative  Condition - Guarded Family Communication :  Wife/Brother updated over phone Disposition :  Likely discharge to family in the next 24-48h pending clinical course and improvement in hypoxia. Patient remains high risk for leaving AMA. We have encouraged family not to pick up patient if he requests until we have safely discharged him. Code Status :  DNR Diet :  Diet Order            Diet Carb Modified Fluid consistency: Thin; Room service appropriate? Yes  Diet effective now              Consults  : Neurology Dr. Lorraine Lax Procedures:    MRI Head, C,T, and L spine unremarkable as below  CT soft tissue neck, chest abdomen pelvis.  All noncontrast due to AKI.  Nonacute except for bilateral pneumonia.  CT L-spine and pelvis.  Nonacute.  Echocardiogram:  1. Left ventricular ejection fraction, by visual estimation, is 60 to 65%. The left ventricle has normal function. There is mildly increased left ventricular hypertrophy.  2. Left ventricular diastolic parameters are consistent with Grade I diastolic dysfunction (impaired relaxation).  3. The left ventricle has no regional wall motion abnormalities.  4. Global right ventricle has normal systolic function.The right ventricular size is normal.  5. Left atrial size was normal.  6. Right atrial size was normal.  7. Mild mitral annular calcification.  8. The mitral valve is normal in structure. No evidence of mitral valve regurgitation. No evidence of mitral stenosis.  9. The tricuspid valve is normal in structure. Tricuspid valve regurgitation is mild. 10. The aortic valve has an indeterminant number of cusps. Aortic valve regurgitation is not visualized. Mild to  moderate aortic valve sclerosis/calcification without any evidence of aortic stenosis. 11. The pulmonic valve was not well visualized. Pulmonic valve regurgitation is not visualized. 12. Technically difficult; normal LV systolic function; grade 1 diastolic dysfunction; mild LVH with mild proximal septal thickening; calcified aortic valve (not well interrogated but no clear AS by doppler).   PUD Prophylaxis : PPI  DVT Prophylaxis:  Switch to high-dose prophylactic Lovenox on 01/16/2019  Lab Results  Component Value Date   PLT 469 (H) 01/24/2019    Inpatient Medications  Scheduled Meds: . amLODipine  10 mg Oral Daily  . aspirin EC  325 mg Oral Daily  . atorvastatin  80 mg Oral q1800  . carvedilol  6.25 mg Oral BID  . Chlorhexidine Gluconate Cloth  6 each Topical Daily  . diclofenac Sodium  2 g Topical QID  . docusate sodium  100 mg Oral BID  . doxycycline  100 mg Oral Q12H  . enoxaparin (LOVENOX) injection  65 mg Subcutaneous Q24H  . insulin aspart  0-20 Units Subcutaneous Q4H  . insulin aspart  3 Units Subcutaneous TID WC  . insulin glargine  30 Units Subcutaneous BID  . insulin starter kit- syringes  1 kit Other Once  . LORazepam  1 mg Intravenous Once  . methylPREDNISolone (SOLU-MEDROL) injection  40 mg Intravenous Q12H  . pantoprazole  40 mg Oral BID   Continuous Infusions:  PRN Meds:.acetaminophen, acetaminophen, menthol-cetylpyridinium, metoprolol tartrate, [DISCONTINUED] ondansetron **OR** ondansetron (ZOFRAN) IV, senna, traMADol  Antibiotics  :    Anti-infectives (From admission, onward)   Start     Dose/Rate Route Frequency Ordered Stop   01/21/19 1800  Ampicillin-Sulbactam (UNASYN) 3 g in sodium chloride 0.9 % 100 mL IVPB     3  g 200 mL/hr over 30 Minutes Intravenous Every 6 hours 01/21/19 1325 01/25/19 0817   01/20/19 2200  doxycycline (VIBRA-TABS) tablet 100 mg     100 mg Oral Every 12 hours 01/20/19 1827 01/29/19 2359   01/16/19 1000  remdesivir 100 mg in  sodium chloride 0.9 % 100 mL IVPB  Status:  Discontinued     100 mg 200 mL/hr over 30 Minutes Intravenous Daily 01/15/19 1104 01/15/19 1107   01/16/19 1000  remdesivir 100 mg in sodium chloride 0.9 % 100 mL IVPB     100 mg 200 mL/hr over 30 Minutes Intravenous Daily 01/15/19 1114 01/19/19 1100   01/15/19 1200  remdesivir 200 mg in sodium chloride 0.9% 250 mL IVPB  Status:  Discontinued     200 mg 580 mL/hr over 30 Minutes Intravenous Once 01/15/19 1104 01/15/19 1107   01/15/19 1200  Ampicillin-Sulbactam (UNASYN) 3 g in sodium chloride 0.9 % 100 mL IVPB  Status:  Discontinued     3 g 200 mL/hr over 30 Minutes Intravenous Every 6 hours 01/15/19 1107 01/21/19 1325       Time Spent in minutes  30   Little Ishikawa M.D on 01/26/2019 at 2:38 PM  To page go to [email or web address] - password [facility]  [facility] -  Office  [phone]  See all Orders from today for further details    Objective:   Vitals:   01/26/19 0807 01/26/19 0932 01/26/19 1000 01/26/19 1204  BP: 125/85   132/78  Pulse: 81 86 88 87  Resp: 16  (!) 31 (!) 22  Temp: 98.2 F (36.8 C)   98.4 F (36.9 C)  TempSrc: Oral   Oral  SpO2: 92%  91% (!) 88%  Weight:      Height:        Wt Readings from Last 3 Encounters:  01/15/19 128.4 kg  01/15/19 128.4 kg     Intake/Output Summary (Last 24 hours) at 01/26/2019 1438 Last data filed at 01/26/2019 1400 Gross per 24 hour  Intake 480 ml  Output 1620 ml  Net -1140 ml     Physical Exam  General:  Pleasantly resting in bed, No acute distress.  Somewhat flat mood/affect, alert oriented x4 HEENT:  Normocephalic atraumatic.  Sclerae nonicteric, noninjected.  Extraocular movements intact bilaterally. Neck:  Without mass or deformity.  Trachea is midline. Lungs: Coarse breath sounds bilaterally without overt rhonchi, wheeze, or rales. Heart:  Regular rate and rhythm.  Without murmurs, rubs, or gallops. Abdomen:  Soft, nontender, nondistended.  Without guarding  or rebound. Extremities: Without cyanosis, clubbing, edema, or obvious deformity. Vascular:  Dorsalis pedis and posterior tibial pulses palpable bilaterally. Skin:  Warm and dry, no erythema, no ulcerations.     Data Review:    CBC Recent Labs  Lab 01/20/19 1020 01/21/19 0440 01/22/19 0135 01/23/19 0305 01/24/19 0035  WBC 13.0* 15.2* 17.2* 19.8* 20.9*  HGB 12.2* 13.7 13.0 12.2* 11.8*  HCT 38.7* 45.8 43.1 40.5 38.1*  PLT 316 412* 441* 483* 469*  MCV 78.5* 79.8* 79.7* 80.2 78.9*  MCH 24.7* 23.9* 24.0* 24.2* 24.4*  MCHC 31.5 29.9* 30.2 30.1 31.0  RDW 15.0 15.1 15.1 15.2 14.9  LYMPHSABS  --  1.8 1.2 1.4 1.2  MONOABS  --  1.3* 1.0 0.9 0.8  EOSABS  --  0.0 0.0 0.0 0.0  BASOSABS  --  0.1 0.1 0.1 0.0    Chemistries  Recent Labs  Lab 01/20/19 1020 01/21/19 0440 01/22/19 0135 01/23/19 0305 01/24/19  0035  NA 137 140 136 137 138  K 4.1 4.1 5.1 4.6 4.6  CL 94* 94* 94* 95* 99  CO2 33* 34* _0 GLUCOSE 159* 74 352* 185* 154*  BUN 27* 28* 33* 29* 28*  CREATININE 1.27* 1.50* 1.54* 1.36* 1.18  CALCIUM 7.7* 8.3* 8.1* 7.8* 7.9*  MG  --  2.4 2.5*  --   --   AST  --  30 34 34 33  ALT  --  41 43 43 47*  ALKPHOS  --  69 70 60 60  BILITOT  --  0.5 0.2* 0.4 0.7   ------------------------------------------------------------------------------------------------------------------ No results for input(s): CHOL, HDL, LDLCALC, TRIG, CHOLHDL, LDLDIRECT in the last 72 hours.  Lab Results  Component Value Date   HGBA1C 8.2 (H) 01/15/2019   ------------------------------------------------------------------------------------------------------------------ No results for input(s): TSH, T4TOTAL, T3FREE, THYROIDAB in the last 72 hours.  Invalid input(s): FREET3  Cardiac Enzymes No results for input(s): CKMB, TROPONINI, MYOGLOBIN in the last 168 hours.  Invalid input(s):  CK ------------------------------------------------------------------------------------------------------------------    Component Value Date/Time   BNP 34.6 01/24/2019 0035    Micro Results  Recent Results (from the past 240 hour(s))  Culture, blood (routine x 2)     Status: None   Collection Time: 01/21/19  2:27 PM   Specimen: BLOOD  Result Value Ref Range Status   Specimen Description   Final    BLOOD RIGHT ARM Performed at [facility] [address], [place], [place] [address]    Special Requests   Final    BOTTLES DRAWN AEROBIC ONLY Blood Culture results may not be optimal due to an inadequate volume of blood received in culture bottles Performed at [facility] [address] [place], [place] [address]    Culture   Final    NO GROWTH 5 DAYS Performed at [facility], [place] [address], [place], [place] [address]    Report Status 01/26/2019 FINAL  Final  Culture, blood (routine x 2)     Status: None   Collection Time: 01/21/19  2:31 PM   Specimen: BLOOD  Result Value Ref Range Status   Specimen Description   Final    BLOOD RIGHT HAND Performed at [facility] [address], [place], [place] [address]    Special Requests   Final    BOTTLES DRAWN AEROBIC ONLY Blood Culture adequate volume Performed at [facility] [address], [place], [place] [address]    Culture   Final    NO GROWTH 5 DAYS Performed at [facility], [place] [address] [place], [place] [address]    Report Status 01/26/2019 FINAL  Final    Radiology Reports CT ABDOMEN PELVIS WO CONTRAST  Result Date: 01/15/2019 CLINICAL DATA:  Abdominal pain and fever. Abscess suspected. COVID-19 pneumonia. EXAM: CT CHEST, ABDOMEN AND PELVIS WITHOUT CONTRAST TECHNIQUE: Multidetector CT imaging of the chest, abdomen and pelvis was performed following the standard protocol without IV contrast. COMPARISON:  One-view chest x-ray  01/15/2019 FINDINGS: CT CHEST FINDINGS Cardiovascular: The heart size is normal. Scratched at the heart size upper limits of normal. Atherosclerotic calcifications are noted at the aortic valve and aortic arch. There is no aneurysm. Pulmonary artery size is normal. Mediastinum/Nodes: No significant mediastinal hilar, or axillary adenopathy is present. Esophagus is within normal limits. Thoracic inlet is normal. Lungs/Pleura: Bilateral lower lobe airspace consolidation is present. Additional patchy airspace disease is present in the superior segment  of both lower lobes and in the right middle lobe. Mild patchy airspace opacities are present right upper lobe as well. Musculoskeletal: Vertebral body heights are maintained. No focal lytic or blastic lesions are present. CT ABDOMEN PELVIS FINDINGS Hepatobiliary: There is diffuse fatty infiltration of the liver. No discrete lesions are present. The common bile duct and gallbladder are within normal limits. Pancreas: Mild fatty infiltration of the pancreas is present. No discrete lesions are present. Spleen: Normal in size without focal abnormality. Adrenals/Urinary Tract: The adrenal glands are normal bilaterally. Kidneys are unremarkable. There is no stone or mass lesion. No obstruction is present. The ureters are within normal limits. The urinary bladder is decompressed with a Foley catheter in place. Stomach/Bowel: The stomach and duodenum are within limits. Small bowel is unremarkable. Terminal ileum is normal. Appendix is visualized and within limits. The ascending and transverse colon are normal. Descending and sigmoid colon are normal. Vascular/Lymphatic: Atherosclerotic changes are present in the aorta and branch vessels. Reproductive: Prostate is unremarkable. Other: No abdominal wall hernia or abnormality. No abdominopelvic ascites. Focal fluid collection present to suggest abscess Musculoskeletal: Endplate changes are present at L5-S1. Vertebral body heights  are maintained. There is some straightening of lumbar lordosis. Bony pelvis is within normal limits. Hips are located and within normal limits. IMPRESSION: 1. Bilateral lower lobe and right middle lobe airspace disease compatible with multifocal pneumonia. There is likely involvement right upper lobe as well. 2. Hepatic steatosis. 3. No other acute or focal abnormality to explain the patient's abdominal pain. 4. Degenerative changes in the lower lumbar spine. 5. Aortic Atherosclerosis (ICD10-I70.0). Electronically Signed   By: San Morelle M.D.   On: 01/15/2019 16:34   CT HEAD WO CONTRAST  Result Date: 01/21/2019 CLINICAL DATA:  Focal neuro deficit greater than 6 hours. EXAM: CT HEAD WITHOUT CONTRAST TECHNIQUE: Contiguous axial images were obtained from the base of the skull through the vertex without intravenous contrast. COMPARISON:  None. FINDINGS: Brain: Age advanced cerebral atrophy, mild ventriculomegaly and mild periventricular white matter changes. The ventricles are in the midline without mass effect or shift. They are normal in configuration. No extra-axial fluid collections are identified. No CT findings to suggest hemispheric infarction or intracranial hemorrhage. No mass lesions. The brainstem and cerebellum are grossly normal. Vascular: Age advanced vascular calcifications but no hyperdense vessels or obvious aneurysm. Skull: No skull fracture or bone lesion. Sinuses/Orbits: The paranasal sinuses and mastoid air cells are clear. The globes are intact. Other: Scattered scalp calcifications.  No lesions or hematoma. IMPRESSION: 1. Age advanced cerebral atrophy, ventriculomegaly and periventricular white matter disease. 2. No acute intracranial findings or mass lesions. Electronically Signed   By: Marijo Sanes M.D.   On: 01/21/2019 11:04   CT SOFT TISSUE NECK WO CONTRAST  Result Date: 01/15/2019 CLINICAL DATA:  COVID-19 positive.  Sepsis.  Renal insufficiency EXAM: CT NECK WITHOUT  CONTRAST TECHNIQUE: Multidetector CT imaging of the neck was performed following the standard protocol without intravenous contrast. COMPARISON:  None. FINDINGS: Pharynx and larynx: Image quality degraded by motion. Normal airway.  No mass or abscess in the larynx. Salivary glands: No inflammation, mass, or stone. Thyroid: Negative Lymph nodes: No enlarged lymph nodes in the neck. Vascular: Limited vascular evaluation without intravenous contrast. Limited intracranial: Negative Visualized orbits: Negative Mastoids and visualized paranasal sinuses: Paranasal sinuses clear. Small osteoma left frontal sinus. Skeleton: Cervical spondylosis.  No acute skeletal abnormality. Upper chest: Negative Other: None IMPRESSION: No acute abnormality Exam limited by motion and lack  of intravenous contrast. Electronically Signed   By: Franchot Gallo M.D.   On: 01/15/2019 16:34   CT CHEST WO CONTRAST  Result Date: 01/15/2019 CLINICAL DATA:  Abdominal pain and fever. Abscess suspected. COVID-19 pneumonia. EXAM: CT CHEST, ABDOMEN AND PELVIS WITHOUT CONTRAST TECHNIQUE: Multidetector CT imaging of the chest, abdomen and pelvis was performed following the standard protocol without IV contrast. COMPARISON:  One-view chest x-ray 01/15/2019 FINDINGS: CT CHEST FINDINGS Cardiovascular: The heart size is normal. Scratched at the heart size upper limits of normal. Atherosclerotic calcifications are noted at the aortic valve and aortic arch. There is no aneurysm. Pulmonary artery size is normal. Mediastinum/Nodes: No significant mediastinal hilar, or axillary adenopathy is present. Esophagus is within normal limits. Thoracic inlet is normal. Lungs/Pleura: Bilateral lower lobe airspace consolidation is present. Additional patchy airspace disease is present in the superior segment of both lower lobes and in the right middle lobe. Mild patchy airspace opacities are present right upper lobe as well. Musculoskeletal: Vertebral body heights are  maintained. No focal lytic or blastic lesions are present. CT ABDOMEN PELVIS FINDINGS Hepatobiliary: There is diffuse fatty infiltration of the liver. No discrete lesions are present. The common bile duct and gallbladder are within normal limits. Pancreas: Mild fatty infiltration of the pancreas is present. No discrete lesions are present. Spleen: Normal in size without focal abnormality. Adrenals/Urinary Tract: The adrenal glands are normal bilaterally. Kidneys are unremarkable. There is no stone or mass lesion. No obstruction is present. The ureters are within normal limits. The urinary bladder is decompressed with a Foley catheter in place. Stomach/Bowel: The stomach and duodenum are within limits. Small bowel is unremarkable. Terminal ileum is normal. Appendix is visualized and within limits. The ascending and transverse colon are normal. Descending and sigmoid colon are normal. Vascular/Lymphatic: Atherosclerotic changes are present in the aorta and branch vessels. Reproductive: Prostate is unremarkable. Other: No abdominal wall hernia or abnormality. No abdominopelvic ascites. Focal fluid collection present to suggest abscess Musculoskeletal: Endplate changes are present at L5-S1. Vertebral body heights are maintained. There is some straightening of lumbar lordosis. Bony pelvis is within normal limits. Hips are located and within normal limits. IMPRESSION: 1. Bilateral lower lobe and right middle lobe airspace disease compatible with multifocal pneumonia. There is likely involvement right upper lobe as well. 2. Hepatic steatosis. 3. No other acute or focal abnormality to explain the patient's abdominal pain. 4. Degenerative changes in the lower lumbar spine. 5. Aortic Atherosclerosis (ICD10-I70.0). Electronically Signed   By: San Morelle M.D.   On: 01/15/2019 16:34   CT LUMBAR SPINE WO CONTRAST  Result Date: 01/17/2019 CLINICAL DATA:  Right lumbosacral pain radiating to the leg. Low back pain  with increased fracture risk EXAM: CT LUMBAR SPINE WITHOUT CONTRAST TECHNIQUE: Multidetector CT imaging of the lumbar spine was performed without intravenous contrast administration. Multiplanar CT image reconstructions were also generated. COMPARISON:  None. FINDINGS: Segmentation: 5 lumbar type vertebrae Alignment: Normal Vertebrae: No evidence of fracture, discitis, or aggressive bone lesion Paraspinal and other soft tissues: Bilateral pneumonia in this patient with known COVID-19. Disc levels: Disc narrowing and endplate degeneration at L4-5. Mild facet spurring at L4-5 and below IMPRESSION: 1. No acute finding in the lumbar spine. 2. L4-5 moderate disc degeneration. Electronically Signed   By: Monte Fantasia M.D.   On: 01/17/2019 09:40   CT PELVIS WO CONTRAST  Result Date: 01/17/2019 CLINICAL DATA:  Pelvic fracture. EXAM: CT PELVIS WITHOUT CONTRAST TECHNIQUE: Multidetector CT imaging of the pelvis was performed following  the standard protocol without intravenous contrast. COMPARISON:  January 15, 2019. FINDINGS: Urinary Tract: Urinary bladder is decompressed secondary to Foley catheter. Bowel:  Unremarkable visualized pelvic bowel loops. Vascular/Lymphatic: Atherosclerosis of visualized abdominal aorta and iliac arteries is noted. No adenopathy is noted. Reproductive:  No mass or other significant abnormality Other:  None. Musculoskeletal: No suspicious bone lesions identified. IMPRESSION: No definite evidence of pelvic fracture or other significant bony abnormality. Aortic Atherosclerosis (ICD10-I70.0). Electronically Signed   By: Marijo Conception M.D.   On: 01/17/2019 09:45   MR BRAIN WO CONTRAST  Result Date: 01/23/2019 CLINICAL DATA:  Encephalopathy.  Coronavirus infection. EXAM: MRI HEAD WITHOUT CONTRAST TECHNIQUE: Multiplanar, multiecho pulse sequences of the brain and surrounding structures were obtained without intravenous contrast. COMPARISON:  Head CT 01/21/2019 FINDINGS: Brain: Diffusion  imaging does not show any acute or subacute infarction or other cause of restricted diffusion. There are chronic small-vessel ischemic changes of the pons in the cerebral hemispheric white matter. No cortical or large vessel territory insult. No mass lesion, hemorrhage, hydrocephalus or extra-axial collection. Vascular: Major vessels at the base of the brain show flow. Skull and upper cervical spine: Negative Sinuses/Orbits: Clear/normal Other: None IMPRESSION: No acute or reversible finding. Atrophy and chronic small-vessel ischemic changes affecting the brain. No finding attributable to coronavirus infection. Electronically Signed   By: Nelson Chimes M.D.   On: 01/23/2019 13:39   MR CERVICAL SPINE WO CONTRAST  Result Date: 01/23/2019 CLINICAL DATA:  Enephalopathy.  Fevers.  Coronavirus infection. EXAM: MRI CERVICAL SPINE WITHOUT CONTRAST TECHNIQUE: Multiplanar, multisequence MR imaging of the cervical spine was performed. No intravenous contrast was administered. COMPARISON:  None. FINDINGS: Alignment: Straightening of the normal cervical lordosis. Vertebrae: No acute bone finding. Chronic discogenic endplate marrow changes. No active edema. Cord: No cord compression or primary cord lesion. Posterior Fossa, vertebral arteries, paraspinal tissues: Negative Disc levels: No significant finding at C3-4 or above. C4-5: Mild bulging of the disc. No compressive canal or foraminal narrowing. C5-6: Bulging of the disc. Bilateral uncovertebral prominence right worse than left. Bilateral foraminal narrowing could affect either C6 nerve. C6-7: Mild bulging of the disc.  No compressive stenosis. C7-T1: Normal interspace. IMPRESSION: No acute finding. No cord compression or cord lesion. No finding attributable to coronavirus infection. Degenerative cervical spondylosis as above. Electronically Signed   By: Nelson Chimes M.D.   On: 01/23/2019 13:42   MR THORACIC SPINE WO CONTRAST  Result Date: 01/23/2019 CLINICAL DATA:   Back pain.  Coronavirus infection. EXAM: MRI THORACIC SPINE WITHOUT CONTRAST TECHNIQUE: Multiplanar, multisequence MR imaging of the thoracic spine was performed. No intravenous contrast was administered. COMPARISON:  CT 01/15/2019 FINDINGS: Alignment:  Normal Vertebrae: No fracture or primary bone lesion. Cord:  No cord compression or primary cord lesion. Paraspinal and other soft tissues: No visible pleural effusions. Disc levels: No significant disc or disc level pathology. No stenosis of the canal or foramina. No significant facet arthropathy. IMPRESSION: Negative study of the thoracic spine. No abnormality seen to explain pain. No sign of spinal infection. Electronically Signed   By: Nelson Chimes M.D.   On: 01/23/2019 13:45   MR LUMBAR SPINE WO CONTRAST  Result Date: 01/23/2019 CLINICAL DATA:  Spinal stenosis.  Coronavirus infection.  Fever. EXAM: MRI LUMBAR SPINE WITHOUT CONTRAST TECHNIQUE: Multiplanar, multisequence MR imaging of the lumbar spine was performed. No intravenous contrast was administered. COMPARISON:  CT 01/17/2019 FINDINGS: Segmentation:  5 lumbar type vertebral bodies. Alignment:  Normal Vertebrae: Chronic discogenic endplate marrow changes  at L4-5. Otherwise negative. Conus medullaris and cauda equina: Conus extends to the L1-2 level. Conus and cauda equina appear normal. Paraspinal and other soft tissues: Negative Disc levels: No abnormality at L3-4 or above. L4-5: Chronic disc degeneration with loss of disc height, endplate osteophytes and bulging of the disc. Mild narrowing of the lateral recesses and foramina but no visible neural compression. L5-S1: No disc abnormality. Mild facet osteoarthritis. No stenosis. IMPRESSION: Chronic degenerative disc disease at L4-5. Mild narrowing of the lateral recesses and foramina but no visible neural compression. L5-S1: Mild facet osteoarthritis.  No stenosis. No finding to suggest acute regional infection. Electronically Signed   By: Nelson Chimes  M.D.   On: 01/23/2019 13:44   CXR am  Result Date: 01/21/2019 CLINICAL DATA:  Shortness of breath EXAM: PORTABLE CHEST 1 VIEW COMPARISON:  01/15/2019 FINDINGS: Extensive bilateral airspace disease, progressed. Cardiomegaly that is chronic. No suspected pleural effusion. No pneumothorax. IMPRESSION: Extensive and progressive bilateral pneumonia. Electronically Signed   By: Monte Fantasia M.D.   On: 01/21/2019 09:53   CXR am  Result Date: 01/15/2019 CLINICAL DATA:  Acute shortness of breath. Follow-up COVID-19 pneumonia. EXAM: PORTABLE CHEST 1 VIEW 11:16 a.m.: COMPARISON:  Portable chest x-ray earlier same day at 2:55 a.m. FINDINGS: Cardiac silhouette enlarged for AP portable technique, unchanged. Progressive airspace consolidation involving the lower lobes and the RIGHT MIDDLE LOBE since the examination earlier this morning. Streaky opacities in the lingula. UPPER lobes remain clear otherwise. Normal pulmonary vascularity. IMPRESSION: 1. Progressive bilateral lower lobe pneumonia since the examination earlier this morning. 2. Streaky opacities in the lingula, likely atelectasis. 3. Stable cardiomegaly without pulmonary edema. Electronically Signed   By: Evangeline Dakin M.D.   On: 01/15/2019 11:26   DG Chest Port 1 View  Result Date: 01/15/2019 CLINICAL DATA:  Shortness of breath. EXAM: PORTABLE CHEST 1 VIEW COMPARISON:  None. FINDINGS: The heart is enlarged. Patchy right greater than left airspace opacities in the lung bases. No pulmonary edema. No definite pleural effusion. No pneumothorax. No acute osseous abnormalities are seen. IMPRESSION: 1. Right greater than left basilar airspace opacities, suspicious for pneumonia. 2. Mild cardiomegaly. Electronically Signed   By: Keith Rake M.D.   On: 01/15/2019 03:19   ECHOCARDIOGRAM COMPLETE  Result Date: 01/16/2019   ECHOCARDIOGRAM REPORT   Patient Name:   OSBY SWEETIN Date of Exam: 01/16/2019 Medical Rec #:  [id number]    Height: Accession #:     [id number]   Weight:       283.1 lb Date of Birth:  03/03/1955   BSA:          2.55 m Patient Age:    57 years     BP:           155/98 mmHg Patient Gender: M            HR:           105 bpm. Exam Location:  Inpatient Procedure: 2D Echo Indications:    Chest Pain 786.50 / R07.9  History:        Patient has no prior history of Echocardiogram examinations.                 Risk Factors:Hypertension and Diabetes. COVID 19.  Sonographer:    Darlina Sicilian RDCS Referring Phys: Graylin Shiver [facility]  Sonographer Comments: Echo performed with the patient sitting upright in his chair IMPRESSIONS  1. Left ventricular ejection fraction, by visual estimation, is 60 to 65%. The  left ventricle has normal function. There is mildly increased left ventricular hypertrophy.  2. Left ventricular diastolic parameters are consistent with Grade I diastolic dysfunction (impaired relaxation).  3. The left ventricle has no regional wall motion abnormalities.  4. Global right ventricle has normal systolic function.The right ventricular size is normal.  5. Left atrial size was normal.  6. Right atrial size was normal.  7. Mild mitral annular calcification.  8. The mitral valve is normal in structure. No evidence of mitral valve regurgitation. No evidence of mitral stenosis.  9. The tricuspid valve is normal in structure. Tricuspid valve regurgitation is mild. 10. The aortic valve has an indeterminant number of cusps. Aortic valve regurgitation is not visualized. Mild to moderate aortic valve sclerosis/calcification without any evidence of aortic stenosis. 11. The pulmonic valve was not well visualized. Pulmonic valve regurgitation is not visualized. 12. Technically difficult; normal LV systolic function; grade 1 diastolic dysfunction; mild LVH with mild proximal septal thickening; calcified aortic valve (not well interrogated but no clear AS by doppler). FINDINGS  Left Ventricle: Left ventricular ejection fraction, by visual estimation, is  60 to 65%. The left ventricle has normal function. The left ventricle has no regional wall motion abnormalities. There is mildly increased left ventricular hypertrophy. Left ventricular diastolic parameters are consistent with Grade I diastolic dysfunction (impaired relaxation). Normal left atrial pressure. Right Ventricle: The right ventricular size is normal.Global RV systolic function is has normal systolic function. Left Atrium: Left atrial size was normal in size. Right Atrium: Right atrial size was normal in size Pericardium: There is no evidence of pericardial effusion. Mitral Valve: The mitral valve is normal in structure. Mild mitral annular calcification. No evidence of mitral valve regurgitation. No evidence of mitral valve stenosis by observation. Tricuspid Valve: The tricuspid valve is normal in structure. Tricuspid valve regurgitation is mild. Aortic Valve: The aortic valve has an indeterminant number of cusps. Aortic valve regurgitation is not visualized. Mild to moderate aortic valve sclerosis/calcification is present, without any evidence of aortic stenosis. Pulmonic Valve: The pulmonic valve was not well visualized. Pulmonic valve regurgitation is not visualized. Pulmonic regurgitation is not visualized. Aorta: The aortic root is normal in size and structure. Venous: The inferior vena cava was not well visualized.  Additional Comments: Technically difficult; normal LV systolic function; grade 1 diastolic dysfunction; mild LVH with mild proximal septal thickening; calcified aortic valve (not well interrogated but no clear AS by doppler).  LEFT VENTRICLE PLAX 2D LVIDd:         3.78 cm  Diastology LVIDs:         3.04 cm  LV e' lateral:   10.60 cm/s LV PW:         0.90 cm  LV E/e' lateral: 9.0 LV IVS:        1.60 cm  LV e' medial:    6.96 cm/s LVOT diam:     1.90 cm  LV E/e' medial:  13.7 LV SV:         25 ml LVOT Area:     2.84 cm  LEFT ATRIUM             Index LA diam:        3.50 cm 1.37 cm/m LA  Vol (A2C):   64.9 ml 25.50 ml/m LA Vol (A4C):   37.7 ml 14.81 ml/m LA Biplane Vol: 54.7 ml 21.49 ml/m  AORTIC VALVE LVOT Vmax:   99.20 cm/s LVOT Vmean:  59.300 cm/s LVOT VTI:  0.156 m  AORTA Ao Root diam: 3.10 cm MITRAL VALVE                         TRICUSPID VALVE MV Area (PHT): 7.09 cm              TR Peak grad:   35.0 mmHg MV PHT:        31.03 msec            TR Vmax:        296.00 cm/s MV Decel Time: 107 msec MV E velocity: 95.50 cm/s  103 cm/s  SHUNTS MV A velocity: 120.00 cm/s 70.3 cm/s Systemic VTI:  0.16 m MV E/A ratio:  0.80        1.5       Systemic Diam: 1.90 cm  Kirk Ruths MD Electronically signed by Kirk Ruths MD Signature Date/Time: 01/16/2019/1:35:47 PM    Final

## 2019-01-26 NOTE — Plan of Care (Signed)
Updated pt on POC, pt verbalized understanding 

## 2019-01-26 NOTE — Progress Notes (Signed)
Pt ambulated in hallways with front wheel walker. Oxygen saturation 79% while ambulating on RA. Oxygen saturation 88% on 5L via [place] while ambulating. Pt returned to room, oxygen saturation recovered to 90% at rest on 5L via [place].

## 2019-01-27 LAB — GLUCOSE, CAPILLARY
Glucose-Capillary: 107 mg/dL — ABNORMAL HIGH (ref 70–99)
Glucose-Capillary: 117 mg/dL — ABNORMAL HIGH (ref 70–99)
Glucose-Capillary: 214 mg/dL — ABNORMAL HIGH (ref 70–99)
Glucose-Capillary: 241 mg/dL — ABNORMAL HIGH (ref 70–99)
Glucose-Capillary: 256 mg/dL — ABNORMAL HIGH (ref 70–99)
Glucose-Capillary: 263 mg/dL — ABNORMAL HIGH (ref 70–99)
Glucose-Capillary: 342 mg/dL — ABNORMAL HIGH (ref 70–99)
Glucose-Capillary: 381 mg/dL — ABNORMAL HIGH (ref 70–99)

## 2019-01-27 MED ORDER — METHYLPREDNISOLONE SODIUM SUCC 40 MG IJ SOLR
20.0000 mg | Freq: Two times a day (BID) | INTRAMUSCULAR | Status: DC
  Administered 2019-01-27 – 2019-01-28 (×2): 20 mg via INTRAVENOUS
  Filled 2019-01-27 (×2): qty 1

## 2019-01-27 NOTE — Progress Notes (Signed)
Home O2 needs Assessment:  RA at rest= 89%  RA with ambulation=79% 2L O2 with ambulation=89%

## 2019-01-27 NOTE — Progress Notes (Signed)
PROGRESS NOTE                                                                                                                                                                                                             Patient Demographics:    Austin Shannon, is a 63 y.o. male, DOB - 03-Oct-1955, BOF:[id number]  Outpatient Primary MD for the patient is Patient, No Pcp Per    LOS - 12  Admit date - 01/15/2019    CC - AMS  Brief Narrative - Austin Shannon is a 62 y.o. 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   Subjective:   No acute issues or events overnight, patient continues to remove oxygen stating he does not feel short of breath while not wearing it.  Sats in the room this morning ranged from 82 to 86% while resting on room air.  He continues to request discharge, we continue to explain that patient is not safe for discharge given his ongoing moderate hypoxia.  Continue to encourage ambulation proning and respiratory exercises.  Patient declines chest pain, shortness of breath, nausea, vomiting, diarrhea, constipation, headache, fevers, chills.   Assessment  & Plan :    Acute hypoxic respiratory failure 2/2 sepsis, COVID-19 pneumonia, with concurrent bacterial infection (likely aspiration pneumonia), improving. Extremely high procalcitonin in the range of 100, elevated lactic acid, elevated creatinine and high CRP.  He also has history of treated TB in the past.  Initially  placed on IV remdesivir, IV empiric antibiotics (Unasyn and doxycycline).  Unasyn stop date 01/24/2019, doxycycline stop date 01/29/2019 Continue to wean steroids Imaging to date negative for acute infectious process other than chest imaging consistent with pneumonia. Cultures negative other than respiratory cultures which grew few colonies of staph aureus and he is already on doxycycline through 01/29/2019. Resting on room air: 82-84% Walking on 5LNC: 88% Recent Labs  Lab 01/21/19 0440 01/22/19 0135 01/23/19 0305 01/24/19 0035 01/25/19 0109  CRP 19.9* 22.5* 12.7* 9.3*  --   DDIMER 1.42*  1.20* 0.93* 1.03*  --   BNP 39.6 36.8 28.9 34.6  --   PROCALCITON 4.00 2.65 1.49 1.01 0.53    Hepatic Function Latest Ref Rng & Units 01/24/2019 01/23/2019 01/22/2019  Total Protein 6.5 - 8.1 g/dL 7.2 7.0 7.4  Albumin 3.5 - 5.0 g/dL 2.6(L) 2.4(L) 2.7(L)  AST 15 - 41 U/L 33 34 34  ALT 0 - 44 U/L 47(H) 43 43  Alk Phosphatase 38 - 126 U/L 60 60 70  Total Bilirubin 0.3 - 1.2 mg/dL 0.7 0.4 0.2(L)    Acute metabolic encephalopathy, recurrent febrile illness; resolved Likely Covid encephalopathy MRI of the head, neck, thoracic and lumbar spine unremarkable as below Discussed with neurology, if patient continues to have mental status changes and fevers would consider LP however at this time given resolution and improvement will hold off on further testing  Dehydration, rhabdomyolysis with AKI and hyperkalemia, resolved.  CT scan nonacute, improving with hydration and Kayexalate, CK has improved.  IV fluids stopped, tolerating PO well.   No baseline creatinine in system patient is from [place]. Lab Results  Component Value Date   CREATININE 1.18 01/24/2019   CREATININE 1.36 (H) 01/23/2019   CREATININE 1.54 (H) 01/22/2019    Elevated liver enzymes.  Due to COVID-19 along with dehydration/rhabdomyolysis, completely resolved. Hepatic Function Latest Ref Rng & Units 01/24/2019 01/23/2019 01/22/2019    Total Protein 6.5 - 8.1 g/dL 7.2 7.0 7.4  Albumin 3.5 - 5.0 g/dL 2.6(L) 2.4(L) 2.7(L)  AST 15 - 41 U/L 33 34 34  ALT 0 - 44 U/L 47(H) 43 43  Alk Phosphatase 38 - 126 U/L 60 60 70  Total Bilirubin 0.3 - 1.2 mg/dL 0.7 0.4 0.2(L)    Dyslipidemia. Continue statin.  DM type II, uncontrolled.  Increase Lantus 30BID (previously decreased to 25 with hypoglycemia)  Poor outpatient glycemic control due to hyperglycemia.   Diabetic and insulin education ordered. Continue diabetic diet. Continue sliding scale insulin while on steroids Recent Labs    01/27/19 0013 01/27/19 0414 01/27/19 0745  GLUCAP 241* 117* 107*   Lab Results  Component Value Date   HGBA1C 8.2 (H) 01/15/2019    Borderline elevated troponin.  Not an ACS pattern, chest pain-free, EKG nonacute, aspirin, beta-blocker, stable echo.  Hypertension.  Stable on Coreg and Norvasc.  Dysphagia and odynophagia. Soft tissue neck CT unremarkable, non-contrast due to renal failure, supportive care with antibiotics, throat discomfort has resolved. Speech following and currently on regular diet per speech.  Right-sided lumbosacral discomfort.  Most likely musculoskeletal due to his recent fall at home, CT L-spine and pelvis unremarkable and nonacute. MRI of L spine unremarkable. Supportive care with NSAID cream, much improved.  Intermittent right leg weakness, minimal right arm weakness.  Initially thought to be due to lumbosacral discomfort, worse on 01/21/2019, head CT unremarkable, currently on aspirin and statin.  MRI head, C, T, and L spine unremarkable for acute findings  Fevers, resolved  He was initially febrile when he came in, this resolved after he was started on Covid treatment along with antibiotics for aspiration Concern for intermittent aspirating while supine and eating Will continue Unasyn and continue doxycycline as above.   MRI of Head, C, T, and L spine unremarkable Sputum cultures grew few staph aureus  for which she is on doxycycline.  Procalcitonin trending down appropriately  Mild acute on chronic diastolic CHF EF 02% on echocardiogram.   Appears euvolemic Continue beta-blocker. Adequate urine output  after single dose of Lasix Continue to  follow I's and O's - remains net negative  Condition - Guarded Family Communication :  Wife/Brother updated over phone Disposition :  Likely discharge to family in the next 24-48h pending clinical course and improvement in hypoxia. Patient remains high risk for leaving AMA. We have encouraged family not to pick up patient if he requests until we have safely discharged him. Explained that he would need to be on room air at rest at 88% or better given 14+hour drive home; and will need to be on less than 4L [place] with exertion with sats 88% or better. Patient and brother understood the seriousness of his illness and the likely prolonged course of improvement. Code Status :  DNR Diet :  Diet Order            Diet Carb Modified Fluid consistency: Thin; Room service appropriate? Yes  Diet effective now              Consults  : Neurology Dr. Lorraine Lax Procedures:    MRI Head, C,T, and L spine unremarkable for acute findings as below  CT soft tissue neck, chest abdomen pelvis.  All noncontrast due to AKI.  Nonacute except for bilateral pneumonia.  CT L-spine and pelvis.  Nonacute.  Echocardiogram:  1. Left ventricular ejection fraction, by visual estimation, is 60 to 65%. The left ventricle has normal function. There is mildly increased left ventricular hypertrophy.  2. Left ventricular diastolic parameters are consistent with Grade I diastolic dysfunction (impaired relaxation).  3. The left ventricle has no regional wall motion abnormalities.  4. Global right ventricle has normal systolic function.The right ventricular size is normal.  5. Left atrial size was normal.  6. Right atrial size was normal.  7. Mild mitral annular calcification.  8. The  mitral valve is normal in structure. No evidence of mitral valve regurgitation. No evidence of mitral stenosis.  9. The tricuspid valve is normal in structure. Tricuspid valve regurgitation is mild. 10. The aortic valve has an indeterminant number of cusps. Aortic valve regurgitation is not visualized. Mild to moderate aortic valve sclerosis/calcification without any evidence of aortic stenosis. 11. The pulmonic valve was not well visualized. Pulmonic valve regurgitation is not visualized. 12. Technically difficult; normal LV systolic function; grade 1 diastolic dysfunction; mild LVH with mild proximal septal thickening; calcified aortic valve (not well interrogated but no clear AS by doppler).   PUD Prophylaxis : PPI  DVT Prophylaxis:  Switch to high-dose prophylactic Lovenox on 01/16/2019  Lab Results  Component Value Date   PLT 469 (H) 01/24/2019    Inpatient Medications  Scheduled Meds: . amLODipine  10 mg Oral Daily  . aspirin EC  325 mg Oral Daily  . atorvastatin  80 mg Oral q1800  . carvedilol  6.25 mg Oral BID  . Chlorhexidine Gluconate Cloth  6 each Topical Daily  . diclofenac Sodium  2 g Topical QID  . docusate sodium  100 mg Oral BID  . doxycycline  100 mg Oral Q12H  . enoxaparin (LOVENOX) injection  65 mg Subcutaneous Q24H  . insulin aspart  0-20 Units Subcutaneous Q4H  . insulin aspart  3 Units Subcutaneous TID WC  . insulin glargine  30 Units Subcutaneous BID  . insulin starter kit- syringes  1 kit Other Once  . LORazepam  1 mg Intravenous Once  . methylPREDNISolone (SOLU-MEDROL) injection  20 mg Intravenous Q12H  . pantoprazole  40 mg Oral BID   Continuous Infusions:  PRN Meds:.acetaminophen, acetaminophen, menthol-cetylpyridinium, metoprolol tartrate, [  DISCONTINUED] ondansetron **OR** ondansetron (ZOFRAN) IV, senna, traMADol  Antibiotics  :    Anti-infectives (From admission, onward)   Start     Dose/Rate Route Frequency Ordered Stop   01/21/19 1800   Ampicillin-Sulbactam (UNASYN) 3 g in sodium chloride 0.9 % 100 mL IVPB     3 g 200 mL/hr over 30 Minutes Intravenous Every 6 hours 01/21/19 1325 01/25/19 0817   01/20/19 2200  doxycycline (VIBRA-TABS) tablet 100 mg     100 mg Oral Every 12 hours 01/20/19 1827 01/29/19 2359   01/16/19 1000  remdesivir 100 mg in sodium chloride 0.9 % 100 mL IVPB  Status:  Discontinued     100 mg 200 mL/hr over 30 Minutes Intravenous Daily 01/15/19 1104 01/15/19 1107   01/16/19 1000  remdesivir 100 mg in sodium chloride 0.9 % 100 mL IVPB     100 mg 200 mL/hr over 30 Minutes Intravenous Daily 01/15/19 1114 01/19/19 1100   01/15/19 1200  remdesivir 200 mg in sodium chloride 0.9% 250 mL IVPB  Status:  Discontinued     200 mg 580 mL/hr over 30 Minutes Intravenous Once 01/15/19 1104 01/15/19 1107   01/15/19 1200  Ampicillin-Sulbactam (UNASYN) 3 g in sodium chloride 0.9 % 100 mL IVPB  Status:  Discontinued     3 g 200 mL/hr over 30 Minutes Intravenous Every 6 hours 01/15/19 1107 01/21/19 1325       Time Spent in minutes  30   Little Ishikawa M.D on 01/27/2019 at 2:47 PM  To page go to [email or web address] - password [facility] -  Office  [phone]  See all Orders from today for further details    Objective:   Vitals:   01/27/19 0834 01/27/19 1056 01/27/19 1100 01/27/19 1115  BP:      Pulse: 79 89 86   Resp:      Temp:      TempSrc:      SpO2: (!) 82% 90% 95% (!) 82%  Weight:      Height:        Wt Readings from Last 3 Encounters:  01/15/19 128.4 kg  01/15/19 128.4 kg     Intake/Output Summary (Last 24 hours) at 01/27/2019 1447 Last data filed at 01/27/2019 0900 Gross per 24 hour  Intake 350 ml  Output 2000 ml  Net -1650 ml     Physical Exam  General:  Pleasantly resting in bedside chair, No acute distress.  Somewhat flat mood/affect, alert oriented x4 HEENT:  Normocephalic atraumatic.  Sclerae nonicteric, noninjected.  Extraocular movements intact bilaterally. Neck:   Without mass or deformity.  Trachea is midline. Lungs: Coarse breath sounds bilaterally without overt rhonchi, wheeze, or rales. Heart:  Regular rate and rhythm.  Without murmurs, rubs, or gallops. Abdomen:  Soft, nontender, nondistended.  Without guarding or rebound. Extremities: Without cyanosis, clubbing, edema, or obvious deformity. Vascular:  Dorsalis pedis and posterior tibial pulses palpable bilaterally. Skin:  Warm and dry, no erythema, no ulcerations.     Data Review:    CBC Recent Labs  Lab 01/21/19 0440 01/22/19 0135 01/23/19 0305 01/24/19 0035  WBC 15.2* 17.2* 19.8* 20.9*  HGB 13.7 13.0 12.2* 11.8*  HCT 45.8 43.1 40.5 38.1*  PLT 412* 441* 483* 469*  MCV 79.8* 79.7* 80.2 78.9*  MCH 23.9* 24.0* 24.2* 24.4*  MCHC 29.9* 30.2 30.1 31.0  RDW 15.1 15.1 15.2 14.9  LYMPHSABS 1.8 1.2 1.4 1.2  MONOABS 1.3* 1.0 0.9 0.8  EOSABS 0.0 0.0 0.0 0.0  BASOSABS 0.1 0.1 0.1 0.0    Chemistries  Recent Labs  Lab 01/21/19 0440 01/22/19 0135 01/23/19 0305 01/24/19 0035  NA 140 136 137 138  K 4.1 5.1 4.6 4.6  CL 94* 94* 95* 99  CO2 34* 30 30 30   GLUCOSE 74 352* 185* 154*  BUN 28* 33* 29* 28*  CREATININE 1.50* 1.54* 1.36* 1.18  CALCIUM 8.3* 8.1* 7.8* 7.9*  MG 2.4 2.5*  --   --   AST 30 34 34 33  ALT 41 43 43 47*  ALKPHOS 69 70 60 60  BILITOT 0.5 0.2* 0.4 0.7   ------------------------------------------------------------------------------------------------------------------ No results for input(s): CHOL, HDL, LDLCALC, TRIG, CHOLHDL, LDLDIRECT in the last 72 hours.  Lab Results  Component Value Date   HGBA1C 8.2 (H) 01/15/2019   ------------------------------------------------------------------------------------------------------------------ No results for input(s): TSH, T4TOTAL, T3FREE, THYROIDAB in the last 72 hours.  Invalid input(s): FREET3  Cardiac Enzymes No results for input(s): CKMB, TROPONINI, MYOGLOBIN in the last 168 hours.  Invalid input(s):  CK ------------------------------------------------------------------------------------------------------------------    Component Value Date/Time   BNP 34.6 01/24/2019 0035    Micro Results  Recent Results (from the past 240 hour(s))  Culture, blood (routine x 2)     Status: None   Collection Time: 01/21/19  2:27 PM   Specimen: BLOOD  Result Value Ref Range Status   Specimen Description   Final    BLOOD RIGHT ARM Performed at [facility] [address], [place], [place] [address]    Special Requests   Final    BOTTLES DRAWN AEROBIC ONLY Blood Culture results may not be optimal due to an inadequate volume of blood received in culture bottles Performed at [facility] [address] [place], [place] [address]    Culture   Final    NO GROWTH 5 DAYS Performed at [facility], [place] [address], [place], [place] [address]    Report Status 01/26/2019 FINAL  Final  Culture, blood (routine x 2)     Status: None   Collection Time: 01/21/19  2:31 PM   Specimen: BLOOD  Result Value Ref Range Status   Specimen Description   Final    BLOOD RIGHT HAND Performed at [facility] [address] [place], [place] [address]    Special Requests   Final    BOTTLES DRAWN AEROBIC ONLY Blood Culture adequate volume Performed at [facility] [address], [place], [place] [address]    Culture   Final    NO GROWTH 5 DAYS Performed at [facility], [place] [address] [place],  [address]    Report Status 01/26/2019 FINAL  Final    Radiology Reports CT ABDOMEN PELVIS WO CONTRAST  Result Date: 01/15/2019 CLINICAL DATA:  Abdominal pain and fever. Abscess suspected. COVID-19 pneumonia. EXAM: CT CHEST, ABDOMEN AND PELVIS WITHOUT CONTRAST TECHNIQUE: Multidetector CT imaging of the chest, abdomen and pelvis was performed following the standard protocol without IV contrast. COMPARISON:  One-view chest x-ray  01/15/2019 FINDINGS: CT CHEST FINDINGS Cardiovascular: The heart size is normal. Scratched at the heart size upper limits of normal. Atherosclerotic calcifications are noted at the aortic valve and aortic arch. There is no aneurysm. Pulmonary artery size is normal. Mediastinum/Nodes: No significant mediastinal hilar, or axillary adenopathy is present. Esophagus is within normal limits. Thoracic inlet is normal. Lungs/Pleura: Bilateral lower lobe airspace consolidation is present. Additional patchy airspace disease is present in the superior segment of both  lower lobes and in the right middle lobe. Mild patchy airspace opacities are present right upper lobe as well. Musculoskeletal: Vertebral body heights are maintained. No focal lytic or blastic lesions are present. CT ABDOMEN PELVIS FINDINGS Hepatobiliary: There is diffuse fatty infiltration of the liver. No discrete lesions are present. The common bile duct and gallbladder are within normal limits. Pancreas: Mild fatty infiltration of the pancreas is present. No discrete lesions are present. Spleen: Normal in size without focal abnormality. Adrenals/Urinary Tract: The adrenal glands are normal bilaterally. Kidneys are unremarkable. There is no stone or mass lesion. No obstruction is present. The ureters are within normal limits. The urinary bladder is decompressed with a Foley catheter in place. Stomach/Bowel: The stomach and duodenum are within limits. Small bowel is unremarkable. Terminal ileum is normal. Appendix is visualized and within limits. The ascending and transverse colon are normal. Descending and sigmoid colon are normal. Vascular/Lymphatic: Atherosclerotic changes are present in the aorta and branch vessels. Reproductive: Prostate is unremarkable. Other: No abdominal wall hernia or abnormality. No abdominopelvic ascites. Focal fluid collection present to suggest abscess Musculoskeletal: Endplate changes are present at L5-S1. Vertebral body heights  are maintained. There is some straightening of lumbar lordosis. Bony pelvis is within normal limits. Hips are located and within normal limits. IMPRESSION: 1. Bilateral lower lobe and right middle lobe airspace disease compatible with multifocal pneumonia. There is likely involvement right upper lobe as well. 2. Hepatic steatosis. 3. No other acute or focal abnormality to explain the patient's abdominal pain. 4. Degenerative changes in the lower lumbar spine. 5. Aortic Atherosclerosis (ICD10-I70.0). Electronically Signed   By: San Morelle M.D.   On: 01/15/2019 16:34   CT HEAD WO CONTRAST  Result Date: 01/21/2019 CLINICAL DATA:  Focal neuro deficit greater than 6 hours. EXAM: CT HEAD WITHOUT CONTRAST TECHNIQUE: Contiguous axial images were obtained from the base of the skull through the vertex without intravenous contrast. COMPARISON:  None. FINDINGS: Brain: Age advanced cerebral atrophy, mild ventriculomegaly and mild periventricular white matter changes. The ventricles are in the midline without mass effect or shift. They are normal in configuration. No extra-axial fluid collections are identified. No CT findings to suggest hemispheric infarction or intracranial hemorrhage. No mass lesions. The brainstem and cerebellum are grossly normal. Vascular: Age advanced vascular calcifications but no hyperdense vessels or obvious aneurysm. Skull: No skull fracture or bone lesion. Sinuses/Orbits: The paranasal sinuses and mastoid air cells are clear. The globes are intact. Other: Scattered scalp calcifications.  No lesions or hematoma. IMPRESSION: 1. Age advanced cerebral atrophy, ventriculomegaly and periventricular white matter disease. 2. No acute intracranial findings or mass lesions. Electronically Signed   By: Marijo Sanes M.D.   On: 01/21/2019 11:04   CT SOFT TISSUE NECK WO CONTRAST  Result Date: 01/15/2019 CLINICAL DATA:  COVID-19 positive.  Sepsis.  Renal insufficiency EXAM: CT NECK WITHOUT  CONTRAST TECHNIQUE: Multidetector CT imaging of the neck was performed following the standard protocol without intravenous contrast. COMPARISON:  None. FINDINGS: Pharynx and larynx: Image quality degraded by motion. Normal airway.  No mass or abscess in the larynx. Salivary glands: No inflammation, mass, or stone. Thyroid: Negative Lymph nodes: No enlarged lymph nodes in the neck. Vascular: Limited vascular evaluation without intravenous contrast. Limited intracranial: Negative Visualized orbits: Negative Mastoids and visualized paranasal sinuses: Paranasal sinuses clear. Small osteoma left frontal sinus. Skeleton: Cervical spondylosis.  No acute skeletal abnormality. Upper chest: Negative Other: None IMPRESSION: No acute abnormality Exam limited by motion and lack of intravenous  contrast. Electronically Signed   By: Franchot Gallo M.D.   On: 01/15/2019 16:34   CT CHEST WO CONTRAST  Result Date: 01/15/2019 CLINICAL DATA:  Abdominal pain and fever. Abscess suspected. COVID-19 pneumonia. EXAM: CT CHEST, ABDOMEN AND PELVIS WITHOUT CONTRAST TECHNIQUE: Multidetector CT imaging of the chest, abdomen and pelvis was performed following the standard protocol without IV contrast. COMPARISON:  One-view chest x-ray 01/15/2019 FINDINGS: CT CHEST FINDINGS Cardiovascular: The heart size is normal. Scratched at the heart size upper limits of normal. Atherosclerotic calcifications are noted at the aortic valve and aortic arch. There is no aneurysm. Pulmonary artery size is normal. Mediastinum/Nodes: No significant mediastinal hilar, or axillary adenopathy is present. Esophagus is within normal limits. Thoracic inlet is normal. Lungs/Pleura: Bilateral lower lobe airspace consolidation is present. Additional patchy airspace disease is present in the superior segment of both lower lobes and in the right middle lobe. Mild patchy airspace opacities are present right upper lobe as well. Musculoskeletal: Vertebral body heights are  maintained. No focal lytic or blastic lesions are present. CT ABDOMEN PELVIS FINDINGS Hepatobiliary: There is diffuse fatty infiltration of the liver. No discrete lesions are present. The common bile duct and gallbladder are within normal limits. Pancreas: Mild fatty infiltration of the pancreas is present. No discrete lesions are present. Spleen: Normal in size without focal abnormality. Adrenals/Urinary Tract: The adrenal glands are normal bilaterally. Kidneys are unremarkable. There is no stone or mass lesion. No obstruction is present. The ureters are within normal limits. The urinary bladder is decompressed with a Foley catheter in place. Stomach/Bowel: The stomach and duodenum are within limits. Small bowel is unremarkable. Terminal ileum is normal. Appendix is visualized and within limits. The ascending and transverse colon are normal. Descending and sigmoid colon are normal. Vascular/Lymphatic: Atherosclerotic changes are present in the aorta and branch vessels. Reproductive: Prostate is unremarkable. Other: No abdominal wall hernia or abnormality. No abdominopelvic ascites. Focal fluid collection present to suggest abscess Musculoskeletal: Endplate changes are present at L5-S1. Vertebral body heights are maintained. There is some straightening of lumbar lordosis. Bony pelvis is within normal limits. Hips are located and within normal limits. IMPRESSION: 1. Bilateral lower lobe and right middle lobe airspace disease compatible with multifocal pneumonia. There is likely involvement right upper lobe as well. 2. Hepatic steatosis. 3. No other acute or focal abnormality to explain the patient's abdominal pain. 4. Degenerative changes in the lower lumbar spine. 5. Aortic Atherosclerosis (ICD10-I70.0). Electronically Signed   By: San Morelle M.D.   On: 01/15/2019 16:34   CT LUMBAR SPINE WO CONTRAST  Result Date: 01/17/2019 CLINICAL DATA:  Right lumbosacral pain radiating to the leg. Low back pain  with increased fracture risk EXAM: CT LUMBAR SPINE WITHOUT CONTRAST TECHNIQUE: Multidetector CT imaging of the lumbar spine was performed without intravenous contrast administration. Multiplanar CT image reconstructions were also generated. COMPARISON:  None. FINDINGS: Segmentation: 5 lumbar type vertebrae Alignment: Normal Vertebrae: No evidence of fracture, discitis, or aggressive bone lesion Paraspinal and other soft tissues: Bilateral pneumonia in this patient with known COVID-19. Disc levels: Disc narrowing and endplate degeneration at L4-5. Mild facet spurring at L4-5 and below IMPRESSION: 1. No acute finding in the lumbar spine. 2. L4-5 moderate disc degeneration. Electronically Signed   By: Monte Fantasia M.D.   On: 01/17/2019 09:40   CT PELVIS WO CONTRAST  Result Date: 01/17/2019 CLINICAL DATA:  Pelvic fracture. EXAM: CT PELVIS WITHOUT CONTRAST TECHNIQUE: Multidetector CT imaging of the pelvis was performed following the standard  protocol without intravenous contrast. COMPARISON:  January 15, 2019. FINDINGS: Urinary Tract: Urinary bladder is decompressed secondary to Foley catheter. Bowel:  Unremarkable visualized pelvic bowel loops. Vascular/Lymphatic: Atherosclerosis of visualized abdominal aorta and iliac arteries is noted. No adenopathy is noted. Reproductive:  No mass or other significant abnormality Other:  None. Musculoskeletal: No suspicious bone lesions identified. IMPRESSION: No definite evidence of pelvic fracture or other significant bony abnormality. Aortic Atherosclerosis (ICD10-I70.0). Electronically Signed   By: Marijo Conception M.D.   On: 01/17/2019 09:45   MR BRAIN WO CONTRAST  Result Date: 01/23/2019 CLINICAL DATA:  Encephalopathy.  Coronavirus infection. EXAM: MRI HEAD WITHOUT CONTRAST TECHNIQUE: Multiplanar, multiecho pulse sequences of the brain and surrounding structures were obtained without intravenous contrast. COMPARISON:  Head CT 01/21/2019 FINDINGS: Brain: Diffusion  imaging does not show any acute or subacute infarction or other cause of restricted diffusion. There are chronic small-vessel ischemic changes of the pons in the cerebral hemispheric white matter. No cortical or large vessel territory insult. No mass lesion, hemorrhage, hydrocephalus or extra-axial collection. Vascular: Major vessels at the base of the brain show flow. Skull and upper cervical spine: Negative Sinuses/Orbits: Clear/normal Other: None IMPRESSION: No acute or reversible finding. Atrophy and chronic small-vessel ischemic changes affecting the brain. No finding attributable to coronavirus infection. Electronically Signed   By: Nelson Chimes M.D.   On: 01/23/2019 13:39   MR CERVICAL SPINE WO CONTRAST  Result Date: 01/23/2019 CLINICAL DATA:  Enephalopathy.  Fevers.  Coronavirus infection. EXAM: MRI CERVICAL SPINE WITHOUT CONTRAST TECHNIQUE: Multiplanar, multisequence MR imaging of the cervical spine was performed. No intravenous contrast was administered. COMPARISON:  None. FINDINGS: Alignment: Straightening of the normal cervical lordosis. Vertebrae: No acute bone finding. Chronic discogenic endplate marrow changes. No active edema. Cord: No cord compression or primary cord lesion. Posterior Fossa, vertebral arteries, paraspinal tissues: Negative Disc levels: No significant finding at C3-4 or above. C4-5: Mild bulging of the disc. No compressive canal or foraminal narrowing. C5-6: Bulging of the disc. Bilateral uncovertebral prominence right worse than left. Bilateral foraminal narrowing could affect either C6 nerve. C6-7: Mild bulging of the disc.  No compressive stenosis. C7-T1: Normal interspace. IMPRESSION: No acute finding. No cord compression or cord lesion. No finding attributable to coronavirus infection. Degenerative cervical spondylosis as above. Electronically Signed   By: Nelson Chimes M.D.   On: 01/23/2019 13:42   MR THORACIC SPINE WO CONTRAST  Result Date: 01/23/2019 CLINICAL DATA:   Back pain.  Coronavirus infection. EXAM: MRI THORACIC SPINE WITHOUT CONTRAST TECHNIQUE: Multiplanar, multisequence MR imaging of the thoracic spine was performed. No intravenous contrast was administered. COMPARISON:  CT 01/15/2019 FINDINGS: Alignment:  Normal Vertebrae: No fracture or primary bone lesion. Cord:  No cord compression or primary cord lesion. Paraspinal and other soft tissues: No visible pleural effusions. Disc levels: No significant disc or disc level pathology. No stenosis of the canal or foramina. No significant facet arthropathy. IMPRESSION: Negative study of the thoracic spine. No abnormality seen to explain pain. No sign of spinal infection. Electronically Signed   By: Nelson Chimes M.D.   On: 01/23/2019 13:45   MR LUMBAR SPINE WO CONTRAST  Result Date: 01/23/2019 CLINICAL DATA:  Spinal stenosis.  Coronavirus infection.  Fever. EXAM: MRI LUMBAR SPINE WITHOUT CONTRAST TECHNIQUE: Multiplanar, multisequence MR imaging of the lumbar spine was performed. No intravenous contrast was administered. COMPARISON:  CT 01/17/2019 FINDINGS: Segmentation:  5 lumbar type vertebral bodies. Alignment:  Normal Vertebrae: Chronic discogenic endplate marrow changes at L4-5.  Otherwise negative. Conus medullaris and cauda equina: Conus extends to the L1-2 level. Conus and cauda equina appear normal. Paraspinal and other soft tissues: Negative Disc levels: No abnormality at L3-4 or above. L4-5: Chronic disc degeneration with loss of disc height, endplate osteophytes and bulging of the disc. Mild narrowing of the lateral recesses and foramina but no visible neural compression. L5-S1: No disc abnormality. Mild facet osteoarthritis. No stenosis. IMPRESSION: Chronic degenerative disc disease at L4-5. Mild narrowing of the lateral recesses and foramina but no visible neural compression. L5-S1: Mild facet osteoarthritis.  No stenosis. No finding to suggest acute regional infection. Electronically Signed   By: Nelson Chimes  M.D.   On: 01/23/2019 13:44   CXR am  Result Date: 01/21/2019 CLINICAL DATA:  Shortness of breath EXAM: PORTABLE CHEST 1 VIEW COMPARISON:  01/15/2019 FINDINGS: Extensive bilateral airspace disease, progressed. Cardiomegaly that is chronic. No suspected pleural effusion. No pneumothorax. IMPRESSION: Extensive and progressive bilateral pneumonia. Electronically Signed   By: Monte Fantasia M.D.   On: 01/21/2019 09:53   CXR am  Result Date: 01/15/2019 CLINICAL DATA:  Acute shortness of breath. Follow-up COVID-19 pneumonia. EXAM: PORTABLE CHEST 1 VIEW 11:16 a.m.: COMPARISON:  Portable chest x-ray earlier same day at 2:55 a.m. FINDINGS: Cardiac silhouette enlarged for AP portable technique, unchanged. Progressive airspace consolidation involving the lower lobes and the RIGHT MIDDLE LOBE since the examination earlier this morning. Streaky opacities in the lingula. UPPER lobes remain clear otherwise. Normal pulmonary vascularity. IMPRESSION: 1. Progressive bilateral lower lobe pneumonia since the examination earlier this morning. 2. Streaky opacities in the lingula, likely atelectasis. 3. Stable cardiomegaly without pulmonary edema. Electronically Signed   By: Evangeline Dakin M.D.   On: 01/15/2019 11:26   DG Chest Port 1 View  Result Date: 01/15/2019 CLINICAL DATA:  Shortness of breath. EXAM: PORTABLE CHEST 1 VIEW COMPARISON:  None. FINDINGS: The heart is enlarged. Patchy right greater than left airspace opacities in the lung bases. No pulmonary edema. No definite pleural effusion. No pneumothorax. No acute osseous abnormalities are seen. IMPRESSION: 1. Right greater than left basilar airspace opacities, suspicious for pneumonia. 2. Mild cardiomegaly. Electronically Signed   By: Keith Rake M.D.   On: 01/15/2019 03:19   ECHOCARDIOGRAM COMPLETE  Result Date: 01/16/2019   ECHOCARDIOGRAM REPORT   Patient Name:   WILLMAR STOCKINGER Date of Exam: 01/16/2019 Medical Rec #:  [id number]    Height: Accession #:     [id number]   Weight:       283.1 lb Date of Birth:  [birth year]   BSA:          2.55 m Patient Age:    66 years     BP:           155/98 mmHg Patient Gender: M            HR:           105 bpm. Exam Location:  Inpatient Procedure: 2D Echo Indications:    Chest Pain 786.50 / R07.9  History:        Patient has no prior history of Echocardiogram examinations.                 Risk Factors:Hypertension and Diabetes. COVID 19.  Sonographer:    Darlina Sicilian RDCS Referring Phys: [facility]  Sonographer Comments: Echo performed with the patient sitting upright in his chair IMPRESSIONS  1. Left ventricular ejection fraction, by visual estimation, is 60 to 65%. The left ventricle  has normal function. There is mildly increased left ventricular hypertrophy.  2. Left ventricular diastolic parameters are consistent with Grade I diastolic dysfunction (impaired relaxation).  3. The left ventricle has no regional wall motion abnormalities.  4. Global right ventricle has normal systolic function.The right ventricular size is normal.  5. Left atrial size was normal.  6. Right atrial size was normal.  7. Mild mitral annular calcification.  8. The mitral valve is normal in structure. No evidence of mitral valve regurgitation. No evidence of mitral stenosis.  9. The tricuspid valve is normal in structure. Tricuspid valve regurgitation is mild. 10. The aortic valve has an indeterminant number of cusps. Aortic valve regurgitation is not visualized. Mild to moderate aortic valve sclerosis/calcification without any evidence of aortic stenosis. 11. The pulmonic valve was not well visualized. Pulmonic valve regurgitation is not visualized. 12. Technically difficult; normal LV systolic function; grade 1 diastolic dysfunction; mild LVH with mild proximal septal thickening; calcified aortic valve (not well interrogated but no clear AS by doppler). FINDINGS  Left Ventricle: Left ventricular ejection fraction, by visual estimation, is  60 to 65%. The left ventricle has normal function. The left ventricle has no regional wall motion abnormalities. There is mildly increased left ventricular hypertrophy. Left ventricular diastolic parameters are consistent with Grade I diastolic dysfunction (impaired relaxation). Normal left atrial pressure. Right Ventricle: The right ventricular size is normal.Global RV systolic function is has normal systolic function. Left Atrium: Left atrial size was normal in size. Right Atrium: Right atrial size was normal in size Pericardium: There is no evidence of pericardial effusion. Mitral Valve: The mitral valve is normal in structure. Mild mitral annular calcification. No evidence of mitral valve regurgitation. No evidence of mitral valve stenosis by observation. Tricuspid Valve: The tricuspid valve is normal in structure. Tricuspid valve regurgitation is mild. Aortic Valve: The aortic valve has an indeterminant number of cusps. Aortic valve regurgitation is not visualized. Mild to moderate aortic valve sclerosis/calcification is present, without any evidence of aortic stenosis. Pulmonic Valve: The pulmonic valve was not well visualized. Pulmonic valve regurgitation is not visualized. Pulmonic regurgitation is not visualized. Aorta: The aortic root is normal in size and structure. Venous: The inferior vena cava was not well visualized.  Additional Comments: Technically difficult; normal LV systolic function; grade 1 diastolic dysfunction; mild LVH with mild proximal septal thickening; calcified aortic valve (not well interrogated but no clear AS by doppler).  LEFT VENTRICLE PLAX 2D LVIDd:         3.78 cm  Diastology LVIDs:         3.04 cm  LV e' lateral:   10.60 cm/s LV PW:         0.90 cm  LV E/e' lateral: 9.0 LV IVS:        1.60 cm  LV e' medial:    6.96 cm/s LVOT diam:     1.90 cm  LV E/e' medial:  13.7 LV SV:         25 ml LVOT Area:     2.84 cm  LEFT ATRIUM             Index LA diam:        3.50 cm 1.37 cm/m LA  Vol (A2C):   64.9 ml 25.50 ml/m LA Vol (A4C):   37.7 ml 14.81 ml/m LA Biplane Vol: 54.7 ml 21.49 ml/m  AORTIC VALVE LVOT Vmax:   99.20 cm/s LVOT Vmean:  59.300 cm/s LVOT VTI:    0.156 m  AORTA Ao Root diam: 3.10 cm MITRAL VALVE                         TRICUSPID VALVE MV Area (PHT): 7.09 cm              TR Peak grad:   35.0 mmHg MV PHT:        31.03 msec            TR Vmax:        296.00 cm/s MV Decel Time: 107 msec MV E velocity: 95.50 cm/s  103 cm/s  SHUNTS MV A velocity: 120.00 cm/s 70.3 cm/s Systemic VTI:  0.16 m MV E/A ratio:  0.80        1.5       Systemic Diam: 1.90 cm  Kirk Ruths MD Electronically signed by Kirk Ruths MD Signature Date/Time: 01/16/2019/1:35:47 PM    Final

## 2019-01-27 NOTE — Progress Notes (Signed)
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 PMH includes HTN, DM.   OT comments  Pt making good progress in therapy, requiring less assistance with self-care and functional transfer tasks as well as requiring less supplemental oxygen. Pt currently on room air with SpO2 at 90%. Pt able to ambulate to/from bathroom and stand 1 x 8 min at the sink to complete sponge bathing and grooming/hygiene task. Pt reports min shortness of breath throughout. Pt's SpO2 decreased to 80% on room air with pt requiring 1 min seated rest break to return to 87% and 4 min seated rest break to return to 90%. RN updated. 2/4 DOE. Educated and provided pt with handouts regarding energy conservation and relaxation strategies. Continue to recommend [facility] OT for additional rehab following hospital discharge.    Follow Up Recommendations  Home health OT;Supervision - Intermittent    Equipment Recommendations  3 in 1 bedside commode(for use in shower)    Recommendations for Other Services      Precautions / Restrictions Precautions Precautions: Fall Restrictions Weight Bearing Restrictions: No       Mobility Bed Mobility               General bed mobility comments: Pt sitting in bedside chair upon arrival  Transfers Overall transfer level: Needs assistance Equipment used: Rolling walker (2 wheeled) Transfers: Sit to/from Stand Sit to Stand: Supervision              Balance Overall balance assessment: Needs assistance   Sitting balance-Leahy Scale: Good       Standing balance-Leahy Scale: Fair                             ADL either performed or assessed with clinical judgement   ADL Overall  ADL's : Needs assistance/impaired     Grooming: Supervision/safety;Oral care;Wash/dry face;Wash/dry hands;Standing   Upper Body Bathing: Supervision/ safety;Standing   Lower Body Bathing: Supervison/ safety(standing at the sink - sponge bathing task)                       Functional mobility during ADLs: Supervision/safety;Rolling walker General ADL Comments: Pt able to ambulate to/from bathroom with RW. Noted 0 instances of LOB.      Vision       Perception     Praxis      Cognition Arousal/Alertness: Awake/alert Behavior During Therapy: WFL for tasks assessed/performed Overall Cognitive Status: Within Functional Limits for tasks assessed                                 General Comments: Pt more animated and talkative this date.         Exercises     Shoulder Instructions       General Comments SpO2 decreased to 80% on room air during mobility task. 1 min seated recovery to return to 87%, 4 min recovery to return to 90% on room air. Pt reports min SOB.     Pertinent Vitals/ Pain       Pain Assessment: No/denies pain  Home Living  Prior Functioning/Environment              Frequency           Progress Toward Goals  OT Goals(current goals can now be found in the care plan section)  Progress towards OT goals: Progressing toward goals  ADL Goals Pt Will Perform Grooming: with supervision;standing Pt Will Perform Upper Body Bathing: with modified independence;sitting Pt Will Perform Lower Body Bathing: with set-up;sitting/lateral leans;with adaptive equipment Pt Will Transfer to Toilet: with supervision;ambulating Pt Will Perform Toileting - Clothing Manipulation and hygiene: with supervision;sit to/from stand Pt/caregiver will Perform Home Exercise Program: Both right and left upper extremity;With theraband;With written HEP provided Additional ADL Goal #1: Pt will  recall 3 ways of conserving energy during ADL routine with no cues  Plan Discharge plan remains appropriate    Co-evaluation                 AM-PAC OT "6 Clicks" Daily Activity     Outcome Measure   Help from another person eating meals?: None Help from another person taking care of personal grooming?: A Little Help from another person toileting, which includes using toliet, bedpan, or urinal?: A Little Help from another person bathing (including washing, rinsing, drying)?: A Little Help from another person to put on and taking off regular upper body clothing?: A Little Help from another person to put on and taking off regular lower body clothing?: A Lot 6 Click Score: 18    End of Session Equipment Utilized During Treatment: Rolling walker  OT Visit Diagnosis: Unsteadiness on feet (R26.81);Muscle weakness (generalized) (M62.81)   Activity Tolerance Patient tolerated treatment well   Patient Left in chair;with call bell/phone within reach   Nurse Communication Mobility status        Time: 0037-0488 OT Time Calculation (min): 33 min  Charges: OT General Charges $OT Visit: 1 Visit OT Treatments $Self Care/Home Management : 8-22 mins $Therapeutic Activity: 8-22 mins  Mauri Brooklyn OTR/L [phone]    Mauri Brooklyn 01/27/2019, 3:19 PM

## 2019-01-28 DIAGNOSIS — E875 Hyperkalemia: Secondary | ICD-10-CM

## 2019-01-28 DIAGNOSIS — E118 Type 2 diabetes mellitus with unspecified complications: Secondary | ICD-10-CM

## 2019-01-28 DIAGNOSIS — G9341 Metabolic encephalopathy: Secondary | ICD-10-CM

## 2019-01-28 DIAGNOSIS — R7989 Other specified abnormal findings of blood chemistry: Secondary | ICD-10-CM

## 2019-01-28 DIAGNOSIS — I5031 Acute diastolic (congestive) heart failure: Secondary | ICD-10-CM

## 2019-01-28 DIAGNOSIS — M6282 Rhabdomyolysis: Secondary | ICD-10-CM

## 2019-01-28 DIAGNOSIS — N179 Acute kidney failure, unspecified: Secondary | ICD-10-CM

## 2019-01-28 DIAGNOSIS — U071 COVID-19: Principal | ICD-10-CM

## 2019-01-28 DIAGNOSIS — J9601 Acute respiratory failure with hypoxia: Secondary | ICD-10-CM

## 2019-01-28 DIAGNOSIS — E1159 Type 2 diabetes mellitus with other circulatory complications: Secondary | ICD-10-CM

## 2019-01-28 LAB — GLUCOSE, CAPILLARY
Glucose-Capillary: 95 mg/dL (ref 70–99)
Glucose-Capillary: 58 mg/dL — ABNORMAL LOW (ref 70–99)
Glucose-Capillary: 83 mg/dL (ref 70–99)
Glucose-Capillary: 210 mg/dL — ABNORMAL HIGH (ref 70–99)

## 2019-01-28 MED ORDER — INSULIN STARTER KIT- SYRINGES (ENGLISH): 1.0000 | Freq: Once | 0 refills | Status: AC

## 2019-01-28 MED ORDER — INSULIN GLARGINE 100 UNIT/ML ~~LOC~~ SOLN: 30.0000 [IU] | Freq: Two times a day (BID) | SUBCUTANEOUS | 11 refills | Status: AC

## 2019-01-28 MED ORDER — CARVEDILOL 6.25 MG PO TABS: 6.2500 mg | ORAL_TABLET | Freq: Two times a day (BID) | ORAL | 0 refills | Status: AC

## 2019-01-28 MED ORDER — PREDNISONE 10 MG PO TABS: ORAL_TABLET | ORAL | 0 refills | Status: AC

## 2019-01-28 MED ORDER — MENTHOL 3 MG MT LOZG: 1.0000 | LOZENGE | OROMUCOSAL | 12 refills | Status: AC | PRN

## 2019-01-28 MED ORDER — ATORVASTATIN CALCIUM 80 MG PO TABS: 80.0000 mg | ORAL_TABLET | Freq: Every day | ORAL | 0 refills | Status: AC

## 2019-01-28 MED FILL — ULTICARE INS 0.5 ML 30GX1/2: 30G X 1/2" | 30 days supply | Qty: 60 | Fill #0

## 2019-01-28 MED FILL — LANTUS 100 UNITS/ML VIAL: 100 | 15 days supply | Qty: 10 | Fill #0

## 2019-01-28 MED FILL — CARVEDILOL 6.25 MG TABLET: 6.25 | 30 days supply | Qty: 60 | Fill #0

## 2019-01-28 MED FILL — ATORVASTATIN CALCIUM 80 MG: 80 | 30 days supply | Qty: 30 | Fill #0

## 2019-01-28 NOTE — Discharge Summary (Signed)
Physician Discharge Summary  Austin Shannon OHY:[id number] DOB: Feb 06, 1956 DOA: 01/15/2019  PCP: Patient, No Pcp Per  Admit date: 01/15/2019 Discharge date: 01/28/2019  Admitted From: Home Disposition: Home  Recommendations for Outpatient Follow-up:  1. Follow up with PCP in 1-2 weeks 2. 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  Patient met as above with acute metabolic encephalopathy, hypoxia likely in the setting of COVID-19 pneumonia.  Patient also had remarkably elevated procalcitonin concerning for concurrent bacterial infection likely aspiration given mental status.  Patient resolved somewhat slowly over the past 2 weeks but is now ambulating with minimal dyspnea or hypoxia and is able to maintain sats on room air.  Patient also had recurrent fevers throughout his stay concerning for infectious process, multiple imaging modalities were obtained none of which were remarkable for infection other than chest x-ray which is notable for pneumonia as above.  Patient's mental status changes and fever neuro was consulted, MRI of the head CT and L-spine's were obtained as well with no acute findings for infection.  At this time patient is back to baseline per family, ambulating well without dyspnea or  hypoxia and otherwise stable to return home.  Family has driven from [place] to pick the patient up, he will be discharged to their care today.  Patient will need close follow-up with PCP in the next 1 to 2 weeks for repeat lab work.  If patient has any worsening chest pain, shortness of breath, fevers, chills he should report immediately back to the ED for further evaluation and treatment.  We also educated the patient that a prolonged car ride with his family who is presumed negative for Covid this is a very high risk.  We recommend the patient sitting in the back of the car with all the windows down and everyone wearing masks for the entirety of the trip.  She is to remain quarantined for 21 days after his nasal swab through 02/04/2019.  Discharge Diagnoses:  Principal Problem:   Acute hypoxemic respiratory failure due to COVID-19 [facility]) Active Problems:   Acute metabolic encephalopathy   Rhabdomyolysis   AKI (acute kidney injury) ([place])   Hyperkalemia   Elevated liver function tests   Type 2 diabetes mellitus with complication, without long-term current use of insulin (HCC)   Hypertension complicating diabetes ([place])   Acute diastolic CHF (congestive heart failure) ([place])    Discharge Instructions   Allergies as of 01/28/2019   No Known Allergies     Medication List    STOP taking these medications   glimepiride 2 MG tablet Commonly known as: AMARYL   hydrALAZINE 50 MG tablet Commonly known as: APRESOLINE   ibuprofen 600 MG tablet Commonly known as: ADVIL   pravastatin 40 MG tablet Commonly known as: PRAVACHOL     TAKE these medications   albuterol 108 (90  Base) MCG/ACT inhaler Commonly known as: VENTOLIN HFA Inhale into the lungs every 6 (six) hours as needed for wheezing or shortness of breath.   atorvastatin 80 MG tablet Commonly known as: LIPITOR Take 1 tablet (80 mg total) by mouth daily at 6 PM.   carvedilol 6.25 MG tablet Commonly known as: COREG Take 1 tablet  (6.25 mg total) by mouth 2 (two) times daily. What changed:   medication strength  how much to take   cetirizine 10 MG tablet Commonly known as: ZYRTEC Take 10 mg by mouth daily.   insulin glargine 100 UNIT/ML injection Commonly known as: LANTUS Inject 0.3 mLs (30 Units total) into the skin 2 (two) times daily.   insulin starter kit- syringes Misc 1 kit by Other route once for 1 dose.   menthol-cetylpyridinium 3 MG lozenge Commonly known as: CEPACOL Take 1 lozenge (3 mg total) by mouth as needed for sore throat.   metFORMIN 850 MG tablet Commonly known as: GLUCOPHAGE Take 850 mg by mouth 2 (two) times daily with a meal.   predniSONE 10 MG tablet Commonly known as: DELTASONE Take 4 tablets (40 mg total) by mouth daily for 3 days, THEN 3 tablets (30 mg total) daily for 3 days, THEN 2 tablets (20 mg total) daily for 3 days, THEN 1 tablet (10 mg total) daily for 3 days. Start taking on: January 28, 2019   sildenafil 100 MG tablet Commonly known as: VIAGRA Take 100 mg by mouth daily as needed for erectile dysfunction.            Durable Medical Equipment  (From admission, onward)         Start     Ordered   01/15/19 1510  For home use only DME oxygen  Once    Question Answer Comment  Length of Need 6 Months   Mode or (Route) Nasal cannula   Liters per Minute 2   Frequency Continuous (stationary and portable oxygen unit needed)   Oxygen conserving device Yes   Oxygen delivery system Gas      01/15/19 1509          No Known Allergies  Consultations:  Neuro   Procedures/Studies: CT ABDOMEN PELVIS WO CONTRAST  Result Date: 01/15/2019 CLINICAL DATA:  Abdominal pain and fever. Abscess suspected. COVID-19 pneumonia. EXAM: CT CHEST, ABDOMEN AND PELVIS WITHOUT CONTRAST TECHNIQUE: Multidetector CT imaging of the chest, abdomen and pelvis was performed following the standard protocol without IV contrast. COMPARISON:  One-view chest x-ray 01/15/2019 FINDINGS: CT  CHEST FINDINGS Cardiovascular: The heart size is normal. Scratched at the heart size upper limits of normal. Atherosclerotic calcifications are noted at the aortic valve and aortic arch. There is no aneurysm. Pulmonary artery size is normal. Mediastinum/Nodes: No significant mediastinal hilar, or axillary adenopathy is present. Esophagus is within normal limits. Thoracic inlet is normal. Lungs/Pleura: Bilateral lower lobe airspace consolidation is present. Additional patchy airspace disease is present in the superior segment of both lower lobes and in the right middle lobe. Mild patchy airspace opacities are present right upper lobe as well. Musculoskeletal: Vertebral body heights are maintained. No focal lytic or blastic lesions are present. CT ABDOMEN PELVIS FINDINGS Hepatobiliary: There is diffuse fatty infiltration of the liver. No discrete lesions are present. The common bile duct and gallbladder are within normal limits. Pancreas: Mild fatty infiltration of the pancreas is present. No discrete lesions are present. Spleen: Normal in size without focal abnormality. Adrenals/Urinary Tract: The adrenal glands are normal bilaterally.  Kidneys are unremarkable. There is no stone or mass lesion. No obstruction is present. The ureters are within normal limits. The urinary bladder is decompressed with a Foley catheter in place. Stomach/Bowel: The stomach and duodenum are within limits. Small bowel is unremarkable. Terminal ileum is normal. Appendix is visualized and within limits. The ascending and transverse colon are normal. Descending and sigmoid colon are normal. Vascular/Lymphatic: Atherosclerotic changes are present in the aorta and branch vessels. Reproductive: Prostate is unremarkable. Other: No abdominal wall hernia or abnormality. No abdominopelvic ascites. Focal fluid collection present to suggest abscess Musculoskeletal: Endplate changes are present at L5-S1. Vertebral body heights are maintained. There is  some straightening of lumbar lordosis. Bony pelvis is within normal limits. Hips are located and within normal limits. IMPRESSION: 1. Bilateral lower lobe and right middle lobe airspace disease compatible with multifocal pneumonia. There is likely involvement right upper lobe as well. 2. Hepatic steatosis. 3. No other acute or focal abnormality to explain the patient's abdominal pain. 4. Degenerative changes in the lower lumbar spine. 5. Aortic Atherosclerosis (ICD10-I70.0). Electronically Signed   By: San Morelle M.D.   On: 01/15/2019 16:34   CT HEAD WO CONTRAST  Result Date: 01/21/2019 CLINICAL DATA:  Focal neuro deficit greater than 6 hours. EXAM: CT HEAD WITHOUT CONTRAST TECHNIQUE: Contiguous axial images were obtained from the base of the skull through the vertex without intravenous contrast. COMPARISON:  None. FINDINGS: Brain: Age advanced cerebral atrophy, mild ventriculomegaly and mild periventricular white matter changes. The ventricles are in the midline without mass effect or shift. They are normal in configuration. No extra-axial fluid collections are identified. No CT findings to suggest hemispheric infarction or intracranial hemorrhage. No mass lesions. The brainstem and cerebellum are grossly normal. Vascular: Age advanced vascular calcifications but no hyperdense vessels or obvious aneurysm. Skull: No skull fracture or bone lesion. Sinuses/Orbits: The paranasal sinuses and mastoid air cells are clear. The globes are intact. Other: Scattered scalp calcifications.  No lesions or hematoma. IMPRESSION: 1. Age advanced cerebral atrophy, ventriculomegaly and periventricular white matter disease. 2. No acute intracranial findings or mass lesions. Electronically Signed   By: Marijo Sanes M.D.   On: 01/21/2019 11:04   CT SOFT TISSUE NECK WO CONTRAST  Result Date: 01/15/2019 CLINICAL DATA:  COVID-19 positive.  Sepsis.  Renal insufficiency EXAM: CT NECK WITHOUT CONTRAST TECHNIQUE:  Multidetector CT imaging of the neck was performed following the standard protocol without intravenous contrast. COMPARISON:  None. FINDINGS: Pharynx and larynx: Image quality degraded by motion. Normal airway.  No mass or abscess in the larynx. Salivary glands: No inflammation, mass, or stone. Thyroid: Negative Lymph nodes: No enlarged lymph nodes in the neck. Vascular: Limited vascular evaluation without intravenous contrast. Limited intracranial: Negative Visualized orbits: Negative Mastoids and visualized paranasal sinuses: Paranasal sinuses clear. Small osteoma left frontal sinus. Skeleton: Cervical spondylosis.  No acute skeletal abnormality. Upper chest: Negative Other: None IMPRESSION: No acute abnormality Exam limited by motion and lack of intravenous contrast. Electronically Signed   By: Franchot Gallo M.D.   On: 01/15/2019 16:34   CT CHEST WO CONTRAST  Result Date: 01/15/2019 CLINICAL DATA:  Abdominal pain and fever. Abscess suspected. COVID-19 pneumonia. EXAM: CT CHEST, ABDOMEN AND PELVIS WITHOUT CONTRAST TECHNIQUE: Multidetector CT imaging of the chest, abdomen and pelvis was performed following the standard protocol without IV contrast. COMPARISON:  One-view chest x-ray 01/15/2019 FINDINGS: CT CHEST FINDINGS Cardiovascular: The heart size is normal. Scratched at the heart size upper limits of normal. Atherosclerotic calcifications  are noted at the aortic valve and aortic arch. There is no aneurysm. Pulmonary artery size is normal. Mediastinum/Nodes: No significant mediastinal hilar, or axillary adenopathy is present. Esophagus is within normal limits. Thoracic inlet is normal. Lungs/Pleura: Bilateral lower lobe airspace consolidation is present. Additional patchy airspace disease is present in the superior segment of both lower lobes and in the right middle lobe. Mild patchy airspace opacities are present right upper lobe as well. Musculoskeletal: Vertebral body heights are maintained. No focal  lytic or blastic lesions are present. CT ABDOMEN PELVIS FINDINGS Hepatobiliary: There is diffuse fatty infiltration of the liver. No discrete lesions are present. The common bile duct and gallbladder are within normal limits. Pancreas: Mild fatty infiltration of the pancreas is present. No discrete lesions are present. Spleen: Normal in size without focal abnormality. Adrenals/Urinary Tract: The adrenal glands are normal bilaterally. Kidneys are unremarkable. There is no stone or mass lesion. No obstruction is present. The ureters are within normal limits. The urinary bladder is decompressed with a Foley catheter in place. Stomach/Bowel: The stomach and duodenum are within limits. Small bowel is unremarkable. Terminal ileum is normal. Appendix is visualized and within limits. The ascending and transverse colon are normal. Descending and sigmoid colon are normal. Vascular/Lymphatic: Atherosclerotic changes are present in the aorta and branch vessels. Reproductive: Prostate is unremarkable. Other: No abdominal wall hernia or abnormality. No abdominopelvic ascites. Focal fluid collection present to suggest abscess Musculoskeletal: Endplate changes are present at L5-S1. Vertebral body heights are maintained. There is some straightening of lumbar lordosis. Bony pelvis is within normal limits. Hips are located and within normal limits. IMPRESSION: 1. Bilateral lower lobe and right middle lobe airspace disease compatible with multifocal pneumonia. There is likely involvement right upper lobe as well. 2. Hepatic steatosis. 3. No other acute or focal abnormality to explain the patient's abdominal pain. 4. Degenerative changes in the lower lumbar spine. 5. Aortic Atherosclerosis (ICD10-I70.0). Electronically Signed   By: San Morelle M.D.   On: 01/15/2019 16:34   CT LUMBAR SPINE WO CONTRAST  Result Date: 01/17/2019 CLINICAL DATA:  Right lumbosacral pain radiating to the leg. Low back pain with increased fracture  risk EXAM: CT LUMBAR SPINE WITHOUT CONTRAST TECHNIQUE: Multidetector CT imaging of the lumbar spine was performed without intravenous contrast administration. Multiplanar CT image reconstructions were also generated. COMPARISON:  None. FINDINGS: Segmentation: 5 lumbar type vertebrae Alignment: Normal Vertebrae: No evidence of fracture, discitis, or aggressive bone lesion Paraspinal and other soft tissues: Bilateral pneumonia in this patient with known COVID-19. Disc levels: Disc narrowing and endplate degeneration at L4-5. Mild facet spurring at L4-5 and below IMPRESSION: 1. No acute finding in the lumbar spine. 2. L4-5 moderate disc degeneration. Electronically Signed   By: Monte Fantasia M.D.   On: 01/17/2019 09:40   CT PELVIS WO CONTRAST  Result Date: 01/17/2019 CLINICAL DATA:  Pelvic fracture. EXAM: CT PELVIS WITHOUT CONTRAST TECHNIQUE: Multidetector CT imaging of the pelvis was performed following the standard protocol without intravenous contrast. COMPARISON:  January 15, 2019. FINDINGS: Urinary Tract: Urinary bladder is decompressed secondary to Foley catheter. Bowel:  Unremarkable visualized pelvic bowel loops. Vascular/Lymphatic: Atherosclerosis of visualized abdominal aorta and iliac arteries is noted. No adenopathy is noted. Reproductive:  No mass or other significant abnormality Other:  None. Musculoskeletal: No suspicious bone lesions identified. IMPRESSION: No definite evidence of pelvic fracture or other significant bony abnormality. Aortic Atherosclerosis (ICD10-I70.0). Electronically Signed   By: Marijo Conception M.D.   On: 01/17/2019 09:45  MR BRAIN WO CONTRAST  Result Date: 01/23/2019 CLINICAL DATA:  Encephalopathy.  Coronavirus infection. EXAM: MRI HEAD WITHOUT CONTRAST TECHNIQUE: Multiplanar, multiecho pulse sequences of the brain and surrounding structures were obtained without intravenous contrast. COMPARISON:  Head CT 01/21/2019 FINDINGS: Brain: Diffusion imaging does not show any  acute or subacute infarction or other cause of restricted diffusion. There are chronic small-vessel ischemic changes of the pons in the cerebral hemispheric white matter. No cortical or large vessel territory insult. No mass lesion, hemorrhage, hydrocephalus or extra-axial collection. Vascular: Major vessels at the base of the brain show flow. Skull and upper cervical spine: Negative Sinuses/Orbits: Clear/normal Other: None IMPRESSION: No acute or reversible finding. Atrophy and chronic small-vessel ischemic changes affecting the brain. No finding attributable to coronavirus infection. Electronically Signed   By: Nelson Chimes M.D.   On: 01/23/2019 13:39   MR CERVICAL SPINE WO CONTRAST  Result Date: 01/23/2019 CLINICAL DATA:  Enephalopathy.  Fevers.  Coronavirus infection. EXAM: MRI CERVICAL SPINE WITHOUT CONTRAST TECHNIQUE: Multiplanar, multisequence MR imaging of the cervical spine was performed. No intravenous contrast was administered. COMPARISON:  None. FINDINGS: Alignment: Straightening of the normal cervical lordosis. Vertebrae: No acute bone finding. Chronic discogenic endplate marrow changes. No active edema. Cord: No cord compression or primary cord lesion. Posterior Fossa, vertebral arteries, paraspinal tissues: Negative Disc levels: No significant finding at C3-4 or above. C4-5: Mild bulging of the disc. No compressive canal or foraminal narrowing. C5-6: Bulging of the disc. Bilateral uncovertebral prominence right worse than left. Bilateral foraminal narrowing could affect either C6 nerve. C6-7: Mild bulging of the disc.  No compressive stenosis. C7-T1: Normal interspace. IMPRESSION: No acute finding. No cord compression or cord lesion. No finding attributable to coronavirus infection. Degenerative cervical spondylosis as above. Electronically Signed   By: Nelson Chimes M.D.   On: 01/23/2019 13:42   MR THORACIC SPINE WO CONTRAST  Result Date: 01/23/2019 CLINICAL DATA:  Back pain.  Coronavirus  infection. EXAM: MRI THORACIC SPINE WITHOUT CONTRAST TECHNIQUE: Multiplanar, multisequence MR imaging of the thoracic spine was performed. No intravenous contrast was administered. COMPARISON:  CT 01/15/2019 FINDINGS: Alignment:  Normal Vertebrae: No fracture or primary bone lesion. Cord:  No cord compression or primary cord lesion. Paraspinal and other soft tissues: No visible pleural effusions. Disc levels: No significant disc or disc level pathology. No stenosis of the canal or foramina. No significant facet arthropathy. IMPRESSION: Negative study of the thoracic spine. No abnormality seen to explain pain. No sign of spinal infection. Electronically Signed   By: Nelson Chimes M.D.   On: 01/23/2019 13:45   MR LUMBAR SPINE WO CONTRAST  Result Date: 01/23/2019 CLINICAL DATA:  Spinal stenosis.  Coronavirus infection.  Fever. EXAM: MRI LUMBAR SPINE WITHOUT CONTRAST TECHNIQUE: Multiplanar, multisequence MR imaging of the lumbar spine was performed. No intravenous contrast was administered. COMPARISON:  CT 01/17/2019 FINDINGS: Segmentation:  5 lumbar type vertebral bodies. Alignment:  Normal Vertebrae: Chronic discogenic endplate marrow changes at L4-5. Otherwise negative. Conus medullaris and cauda equina: Conus extends to the L1-2 level. Conus and cauda equina appear normal. Paraspinal and other soft tissues: Negative Disc levels: No abnormality at L3-4 or above. L4-5: Chronic disc degeneration with loss of disc height, endplate osteophytes and bulging of the disc. Mild narrowing of the lateral recesses and foramina but no visible neural compression. L5-S1: No disc abnormality. Mild facet osteoarthritis. No stenosis. IMPRESSION: Chronic degenerative disc disease at L4-5. Mild narrowing of the lateral recesses and foramina but no visible neural compression.  L5-S1: Mild facet osteoarthritis.  No stenosis. No finding to suggest acute regional infection. Electronically Signed   By: Nelson Chimes M.D.   On: 01/23/2019  13:44   CXR am  Result Date: 01/21/2019 CLINICAL DATA:  Shortness of breath EXAM: PORTABLE CHEST 1 VIEW COMPARISON:  01/15/2019 FINDINGS: Extensive bilateral airspace disease, progressed. Cardiomegaly that is chronic. No suspected pleural effusion. No pneumothorax. IMPRESSION: Extensive and progressive bilateral pneumonia. Electronically Signed   By: Monte Fantasia M.D.   On: 01/21/2019 09:53   CXR am  Result Date: 01/15/2019 CLINICAL DATA:  Acute shortness of breath. Follow-up COVID-19 pneumonia. EXAM: PORTABLE CHEST 1 VIEW 11:16 a.m.: COMPARISON:  Portable chest x-ray earlier same day at 2:55 a.m. FINDINGS: Cardiac silhouette enlarged for AP portable technique, unchanged. Progressive airspace consolidation involving the lower lobes and the RIGHT MIDDLE LOBE since the examination earlier this morning. Streaky opacities in the lingula. UPPER lobes remain clear otherwise. Normal pulmonary vascularity. IMPRESSION: 1. Progressive bilateral lower lobe pneumonia since the examination earlier this morning. 2. Streaky opacities in the lingula, likely atelectasis. 3. Stable cardiomegaly without pulmonary edema. Electronically Signed   By: Evangeline Dakin M.D.   On: 01/15/2019 11:26   DG Chest Port 1 View  Result Date: 01/15/2019 CLINICAL DATA:  Shortness of breath. EXAM: PORTABLE CHEST 1 VIEW COMPARISON:  None. FINDINGS: The heart is enlarged. Patchy right greater than left airspace opacities in the lung bases. No pulmonary edema. No definite pleural effusion. No pneumothorax. No acute osseous abnormalities are seen. IMPRESSION: 1. Right greater than left basilar airspace opacities, suspicious for pneumonia. 2. Mild cardiomegaly. Electronically Signed   By: Keith Rake M.D.   On: 01/15/2019 03:19   ECHOCARDIOGRAM COMPLETE  Result Date: 01/16/2019   ECHOCARDIOGRAM REPORT   Patient Name:   RORIK VESPA Date of Exam: 01/16/2019 Medical Rec #:  [id number]    Height: Accession #:    [id number]   Weight:        283.1 lb Date of Birth:  [birth year]   BSA:          2.55 m Patient Age:    32 years     BP:           155/98 mmHg Patient Gender: M            HR:           105 bpm. Exam Location:  Inpatient Procedure: 2D Echo Indications:    Chest Pain 786.50 / R07.9  History:        Patient has no prior history of Echocardiogram examinations.                 Risk Factors:Hypertension and Diabetes. COVID 19.  Sonographer:    Darlina Sicilian RDCS Referring Phys: Graylin Shiver [facility]  Sonographer Comments: Echo performed with the patient sitting upright in his chair IMPRESSIONS  1. Left ventricular ejection fraction, by visual estimation, is 60 to 65%. The left ventricle has normal function. There is mildly increased left ventricular hypertrophy.  2. Left ventricular diastolic parameters are consistent with Grade I diastolic dysfunction (impaired relaxation).  3. The left ventricle has no regional wall motion abnormalities.  4. Global right ventricle has normal systolic function.The right ventricular size is normal.  5. Left atrial size was normal.  6. Right atrial size was normal.  7. Mild mitral annular calcification.  8. The mitral valve is normal in structure. No evidence of mitral valve regurgitation. No evidence of mitral stenosis.  9. The tricuspid valve is normal in structure. Tricuspid valve regurgitation is mild. 10. The aortic valve has an indeterminant number of cusps. Aortic valve regurgitation is not visualized. Mild to moderate aortic valve sclerosis/calcification without any evidence of aortic stenosis. 11. The pulmonic valve was not well visualized. Pulmonic valve regurgitation is not visualized. 12. Technically difficult; normal LV systolic function; grade 1 diastolic dysfunction; mild LVH with mild proximal septal thickening; calcified aortic valve (not well interrogated but no clear AS by doppler). FINDINGS  Left Ventricle: Left ventricular ejection fraction, by visual estimation, is 60 to 65%. The left  ventricle has normal function. The left ventricle has no regional wall motion abnormalities. There is mildly increased left ventricular hypertrophy. Left ventricular diastolic parameters are consistent with Grade I diastolic dysfunction (impaired relaxation). Normal left atrial pressure. Right Ventricle: The right ventricular size is normal.Global RV systolic function is has normal systolic function. Left Atrium: Left atrial size was normal in size. Right Atrium: Right atrial size was normal in size Pericardium: There is no evidence of pericardial effusion. Mitral Valve: The mitral valve is normal in structure. Mild mitral annular calcification. No evidence of mitral valve regurgitation. No evidence of mitral valve stenosis by observation. Tricuspid Valve: The tricuspid valve is normal in structure. Tricuspid valve regurgitation is mild. Aortic Valve: The aortic valve has an indeterminant number of cusps. Aortic valve regurgitation is not visualized. Mild to moderate aortic valve sclerosis/calcification is present, without any evidence of aortic stenosis. Pulmonic Valve: The pulmonic valve was not well visualized. Pulmonic valve regurgitation is not visualized. Pulmonic regurgitation is not visualized. Aorta: The aortic root is normal in size and structure. Venous: The inferior vena cava was not well visualized.  Additional Comments: Technically difficult; normal LV systolic function; grade 1 diastolic dysfunction; mild LVH with mild proximal septal thickening; calcified aortic valve (not well interrogated but no clear AS by doppler).  LEFT VENTRICLE PLAX 2D LVIDd:         3.78 cm  Diastology LVIDs:         3.04 cm  LV e' lateral:   10.60 cm/s LV PW:         0.90 cm  LV E/e' lateral: 9.0 LV IVS:        1.60 cm  LV e' medial:    6.96 cm/s LVOT diam:     1.90 cm  LV E/e' medial:  13.7 LV SV:         25 ml LVOT Area:     2.84 cm  LEFT ATRIUM             Index LA diam:        3.50 cm 1.37 cm/m LA Vol (A2C):   64.9 ml  25.50 ml/m LA Vol (A4C):   37.7 ml 14.81 ml/m LA Biplane Vol: 54.7 ml 21.49 ml/m  AORTIC VALVE LVOT Vmax:   99.20 cm/s LVOT Vmean:  59.300 cm/s LVOT VTI:    0.156 m  AORTA Ao Root diam: 3.10 cm MITRAL VALVE                         TRICUSPID VALVE MV Area (PHT): 7.09 cm              TR Peak grad:   35.0 mmHg MV PHT:        31.03 msec            TR Vmax:  296.00 cm/s MV Decel Time: 107 msec MV E velocity: 95.50 cm/s  103 cm/s  SHUNTS MV A velocity: 120.00 cm/s 70.3 cm/s Systemic VTI:  0.16 m MV E/A ratio:  0.80        1.5       Systemic Diam: 1.90 cm  Kirk Ruths MD Electronically signed by Kirk Ruths MD Signature Date/Time: 01/16/2019/1:35:47 PM    Final     Subjective: No acute issues or events overnight, patient now off oxygen at rest ambulating without discomfort or dyspnea.  Requesting discharge home which is certainly reasonable today given his resolution of symptoms and completion of therapies.   Discharge Exam: Vitals:   01/28/19 0630 01/28/19 0719  BP:  119/81  Pulse: 85 84  Resp:  18  Temp:  98.7 F (37.1 C)  SpO2: (!) 87% 90%   Vitals:   01/28/19 0615 01/28/19 0621 01/28/19 0630 01/28/19 0719  BP:    119/81  Pulse: 85  85 84  Resp:    18  Temp:    98.7 F (37.1 C)  TempSrc:    Oral  SpO2: (!) 89% 91% (!) 87% 90%  Weight:      Height:        General:  Pleasantly resting in bed, No acute distress. HEENT:  Normocephalic atraumatic.  Sclerae nonicteric, noninjected.  Extraocular movements intact bilaterally. Neck:  Without mass or deformity.  Trachea is midline. Lungs:  Clear to auscultate bilaterally without rhonchi, wheeze, or rales. Heart:  Regular rate and rhythm.  Without murmurs, rubs, or gallops. Abdomen:  Soft, nontender, nondistended.  Without guarding or rebound. Extremities: Without cyanosis, clubbing, edema, or obvious deformity. Vascular:  Dorsalis pedis and posterior tibial pulses palpable bilaterally. Skin:  Warm and dry, no erythema, no  ulcerations.   The results of significant diagnostics from this hospitalization (including imaging, microbiology, ancillary and laboratory) are listed below for reference.     Microbiology: Recent Results (from the past 240 hour(s))  Culture, blood (routine x 2)     Status: None   Collection Time: 01/21/19  2:27 PM   Specimen: BLOOD  Result Value Ref Range Status   Specimen Description   Final    BLOOD RIGHT ARM Performed at [facility] [address], [place], [place] [address]    Special Requests   Final    BOTTLES DRAWN AEROBIC ONLY Blood Culture results may not be optimal due to an inadequate volume of blood received in culture bottles Performed at [facility] [address] [place], [place] [address]    Culture   Final    NO GROWTH 5 DAYS Performed at [facility], [place] [address] [place], [place] [address]    Report Status 01/26/2019 FINAL  Final  Culture, blood (routine x 2)     Status: None   Collection Time: 01/21/19  2:31 PM   Specimen: BLOOD  Result Value Ref Range Status   Specimen Description   Final    BLOOD RIGHT HAND Performed at [facility] [address], [place], [place] [address]    Special Requests   Final    BOTTLES DRAWN AEROBIC ONLY Blood Culture adequate volume Performed at [facility] [address], [place], [place] [address]    Culture   Final    NO GROWTH 5 DAYS Performed at [facility], [place] [address] [place], [place] [address]    Report Status 01/26/2019 FINAL  Final     Labs: BNP (last 3 results) Recent Labs    01/22/19 0135 01/23/19 0305 01/24/19 0035  BNP 36.8 28.9 02.7   Basic Metabolic Panel: Recent Labs  Lab 01/22/19 0135 01/23/19 0305 01/24/19 0035  NA 136 137 138  K 5.1 4.6 4.6  CL 94* 95* 99  CO2 30 30 30   GLUCOSE 352* 185* 154*  BUN 33* 29* 28*  CREATININE 1.54* 1.36* 1.18  CALCIUM 8.1* 7.8* 7.9*  MG 2.5*  --    --    Liver Function Tests: Recent Labs  Lab 01/22/19 0135 01/23/19 0305 01/24/19 0035  AST 34 34 33  ALT 43 43 47*  ALKPHOS 70 60 60  BILITOT 0.2* 0.4 0.7  PROT 7.4 7.0 7.2  ALBUMIN 2.7* 2.4* 2.6*   No results for input(s): LIPASE, AMYLASE in the last 168 hours. No results for input(s): AMMONIA in the last 168 hours. CBC: Recent Labs  Lab 01/22/19 0135 01/23/19 0305 01/24/19 0035  WBC 17.2* 19.8* 20.9*  NEUTROABS 14.1* 16.7* 18.4*  HGB 13.0 12.2* 11.8*  HCT 43.1 40.5 38.1*  MCV 79.7* 80.2 78.9*  PLT 441* 483* 469*   Cardiac Enzymes: No results for input(s): CKTOTAL, CKMB, CKMBINDEX, TROPONINI in the last 168 hours. BNP: Invalid input(s): POCBNP CBG: Recent Labs  Lab 01/27/19 1107 01/27/19 1529 01/27/19 2029 01/28/19 0451 01/28/19 0721  GLUCAP 256* 342* 263* 95 58*   D-Dimer No results for input(s): DDIMER in the last 72 hours. Hgb A1c No results for input(s): HGBA1C in the last 72 hours. Lipid Profile No results for input(s): CHOL, HDL, LDLCALC, TRIG, CHOLHDL, LDLDIRECT in the last 72 hours. Thyroid function studies No results for input(s): TSH, T4TOTAL, T3FREE, THYROIDAB in the last 72 hours.  Invalid input(s): FREET3 Anemia work up No results for input(s): VITAMINB12, FOLATE, FERRITIN, TIBC, IRON, RETICCTPCT in the last 72 hours. Urinalysis    Component Value Date/Time   COLORURINE YELLOW 01/15/2019 1845   APPEARANCEUR CLEAR 01/15/2019 1845   LABSPEC 1.019 01/15/2019 1845   PHURINE 5.0 01/15/2019 1845   GLUCOSEU 50 (A) 01/15/2019 1845   HGBUR LARGE (A) 01/15/2019 1845   BILIRUBINUR NEGATIVE 01/15/2019 1845   KETONESUR NEGATIVE 01/15/2019 1845   PROTEINUR 100 (A) 01/15/2019 1845   NITRITE NEGATIVE 01/15/2019 1845   LEUKOCYTESUR NEGATIVE 01/15/2019 1845   Sepsis Labs Invalid input(s): PROCALCITONIN,  WBC,  LACTICIDVEN Microbiology Recent Results (from the past 240 hour(s))  Culture, blood (routine x 2)     Status: None   Collection Time:  01/21/19  2:27 PM   Specimen: BLOOD  Result Value Ref Range Status   Specimen Description   Final    BLOOD RIGHT ARM Performed at [facility], [place] [address] [place], [place] [address]    Special Requests   Final    BOTTLES DRAWN AEROBIC ONLY Blood Culture results may not be optimal due to an inadequate volume of blood received in culture bottles Performed at [facility] [address] [place], [place] [address]    Culture   Final    NO GROWTH 5 DAYS Performed at [facility], [place] [address], [place],  [address]    Report Status 01/26/2019 FINAL  Final  Culture, blood (routine x 2)     Status: None   Collection Time: 01/21/19  2:31 PM   Specimen: BLOOD  Result Value Ref Range Status   Specimen Description   Final    BLOOD RIGHT HAND  Performed at [facility], [place] [address], [place], [place] [address]    Special Requests   Final    BOTTLES DRAWN AEROBIC ONLY Blood Culture adequate volume Performed at [facility] [address] [place], [place] [address]    Culture   Final    NO GROWTH 5 DAYS Performed at [facility], [place] [address] [place], [place] [address]    Report Status 01/26/2019 FINAL  Final     Time coordinating discharge: Over 30 minutes  SIGNED:   Little Ishikawa, DO [facility] 01/28/2019, 8:10 AM Pager   If 7PM-7AM, please contact night-coverage [email or web address] Password [email or web address]

## 2019-01-28 NOTE — Progress Notes (Signed)
Pt slept throughout night, little restlessness, breathing in the teens, p ox holding between 89-91 on RA. CBG's have decreased slowly through the shift. Pt is offering no s/sx of glycemic concerns. 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 Implemented floor matt and fall risk signs/socks. Pt using urinal w/o concerns at bedside, independent with bed mobility, safely. Afebrile, uses incentive spirometer and flutter valve with encouragement from nursing. Pt aware of discharge planning for insulin kit possibility and management of covid recovery along w/DM and steriod med regime. Pt verbalized understanding of above teaching.

## 2019-01-28 NOTE — Plan of Care (Signed)
  Problem: Respiratory: Goal: Complications related to the disease process, condition or treatment will be avoided or minimized Outcome: Progressing   Problem: Respiratory: Goal: Will maintain a patent airway Outcome: Progressing   Problem: Coping: Goal: Psychosocial and spiritual needs will be supported Outcome: Progressing   Problem: Education: Goal: Knowledge of risk factors and measures for prevention of condition will improve Outcome: Progressing   

## 2019-01-28 NOTE — TOC Transition Note (Signed)
Transition of Care [facility]) - CM/SW Discharge Note   Patient Details  Name: Yashas Camilli MRN: [id number] Date of Birth: Feb 03, 1956  [facility] CM/SW Contact:  Shade Flood, LCSW Phone Number: 01/28/2019, 11:09 AM   Clinical Narrative:     Pt stable to dc from the hospital today per MD. Pt does not need O2 for dc. He did need medication and since patient does not have insurance, MATCH voucher was completed for medication assistance to be provided and meds were delivered from [facility]. MD notes indicate that he has discussed with pt's wife that it remains possible for pt to transmit the virus and that it is not ideal for pt to transport home to [facility] via family car. Wife continues to state that this is the plan. She is picking him up today. MD note indicates recommendations were made for process to follow during transport home. Pt's wife will need to assist pt in making it to his PCP for follow up care. She has verbalized that she understands this.   There are no other TOC needs for dc.  Final next level of care: Home/Self Care Barriers to Discharge: Barriers Resolved   Patient Goals and CMS Choice Patient states their goals for this hospitalization and ongoing recovery are:: per the spouse to return home to [place]      Discharge Placement                       Discharge Plan and Services In-house Referral: Clinical Social Work Discharge Planning Services: [facility]                                 Social Determinants of Health (SDOH) Interventions     Readmission Risk Interventions No flowsheet data found.

## 2020-02-25 IMAGING — MR MR THORACIC SPINE W/O CM
12 of 27 series · 19 of 48 positions shown · IV contrast (Yes)
Comparison: CT 01/15/2019

CLINICAL DATA: Back pain.  Coronavirus infection.

EXAM:
MRI THORACIC SPINE WITHOUT CONTRAST
TECHNIQUE: Multiplanar, multisequence MR imaging of the thoracic spine was
performed. No intravenous contrast was administered.

[Series 4: T1 · sagittal · 5.0mm · 0.47mm/px · 1 of 24 slices shown (1 of 4)]
[im 1/24]
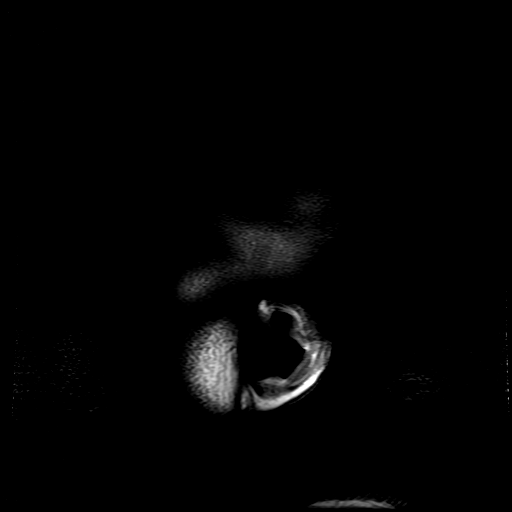

[Series 5: T2 · axial · 5.0mm · 0.47mm/px · 1 of 29 slices shown (1 of 8)]
[im 1/29]
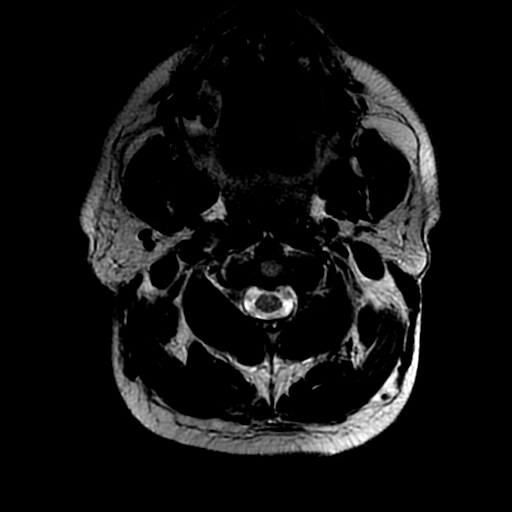

[Series 8: T1 · axial · 2.0mm · 0.47mm/px · z∈[-60,+110]mm · 4 of 86 slices shown (2 of 4)]
[im 1/86]
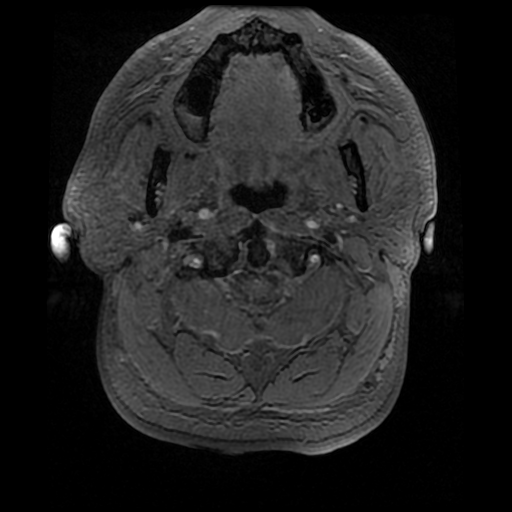
[im 29/86]
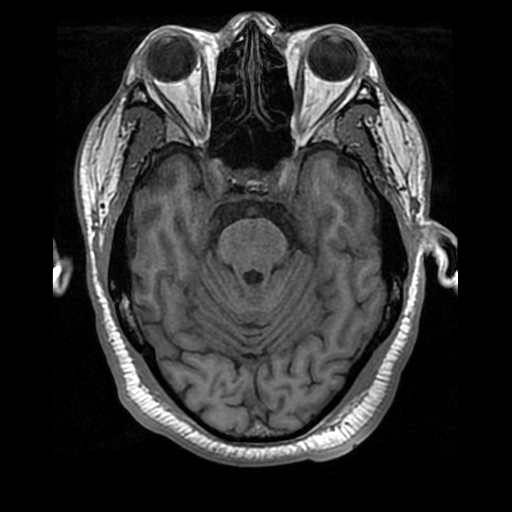
[im 57/86]
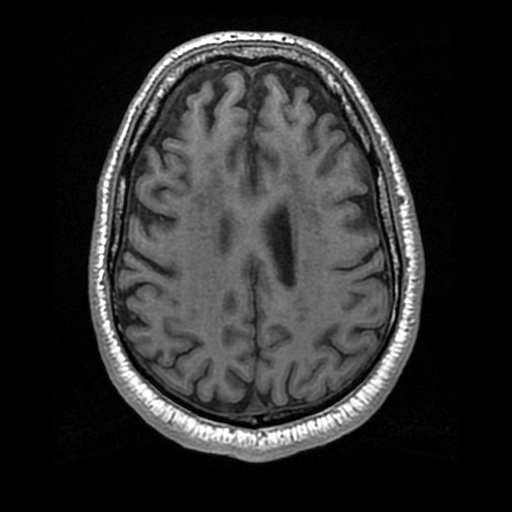
[im 86/86]
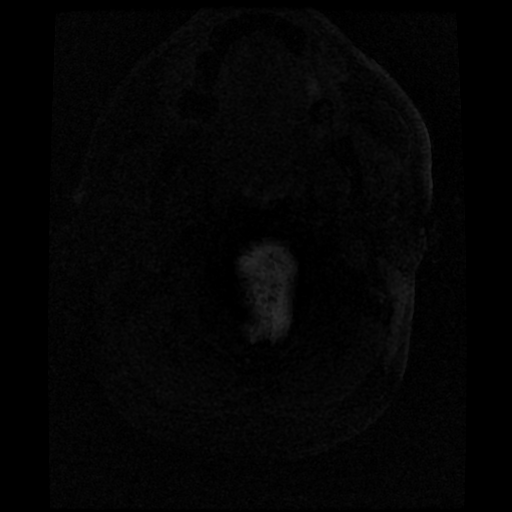

[Series 10: T2 · coronal · 5.0mm · 0.45mm/px · 2 of 30 slices shown (2 of 8)]
[im 1/30]
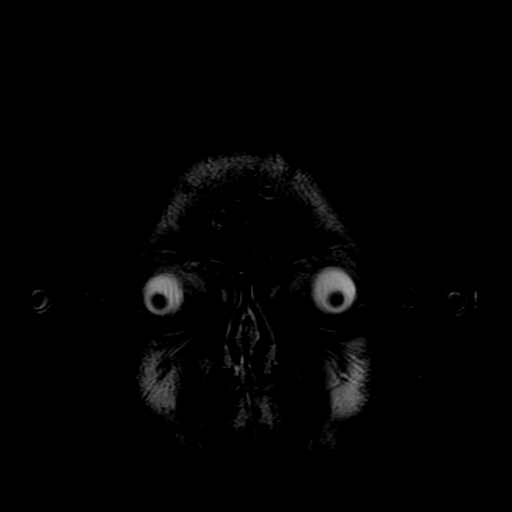
[im 30/30]
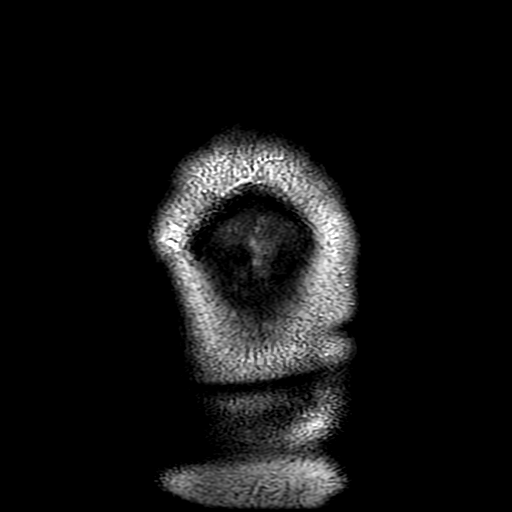

[Series 12: T1 · sagittal · 3.0mm · 0.41mm/px · 1 of 14 slices shown (3 of 4)]
[im 1/14]
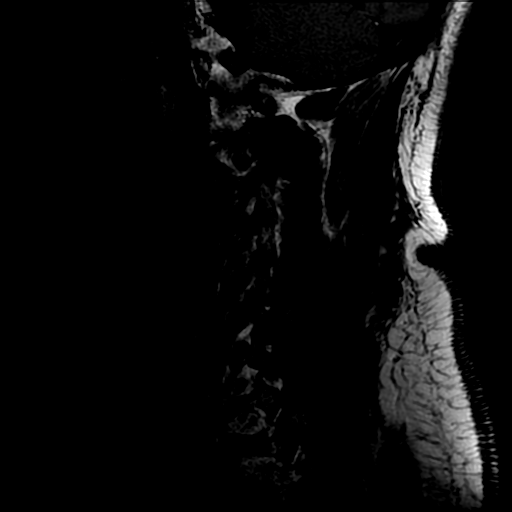

[Series 14: T2 · sagittal · 3.0mm · 0.41mm/px · 1 of 14 slices shown (3 of 8)]
[im 1/14]
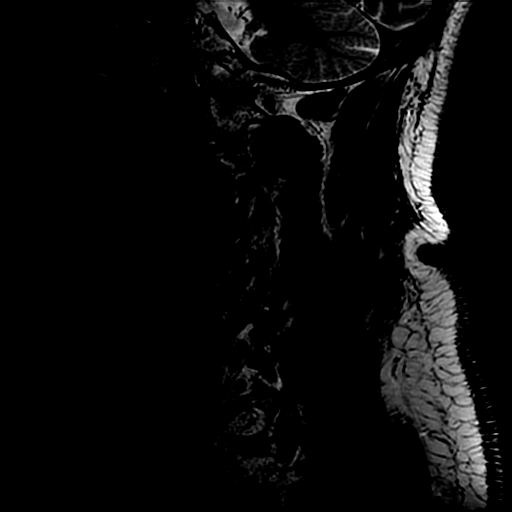

[Series 16: T2 · axial · 3.1mm · 0.35mm/px · z∈[-61,+55]mm · 2 of 34 slices shown (4 of 8)]
[im 1/34]
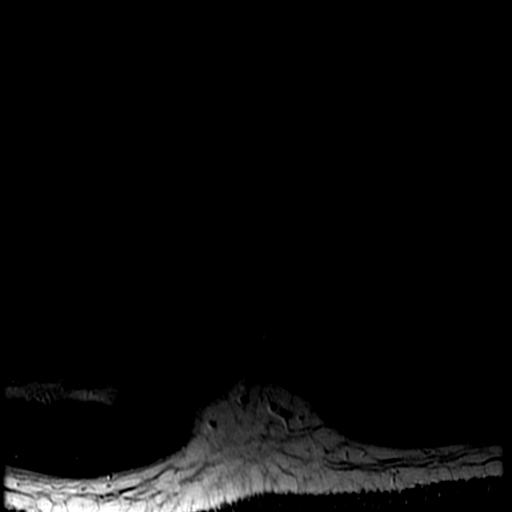
[im 34/34]
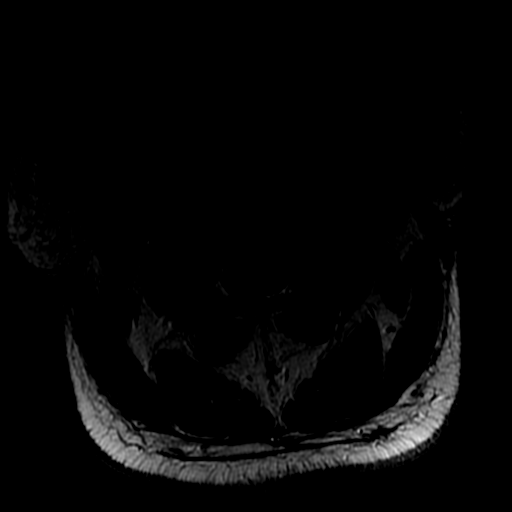

[Series 20: T1 · sagittal · 3.0mm · 0.66mm/px · 1 of 16 slices shown (4 of 4)]
[im 1/16]
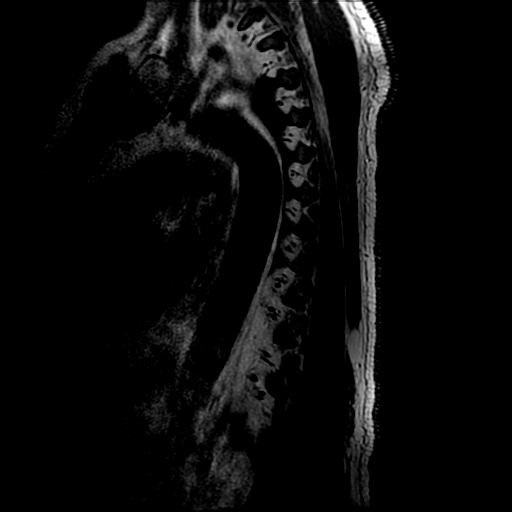

[Series 22: T2 · sagittal · 3.0mm · 0.66mm/px · 1 of 16 slices shown (5 of 8)]
[im 1/16]
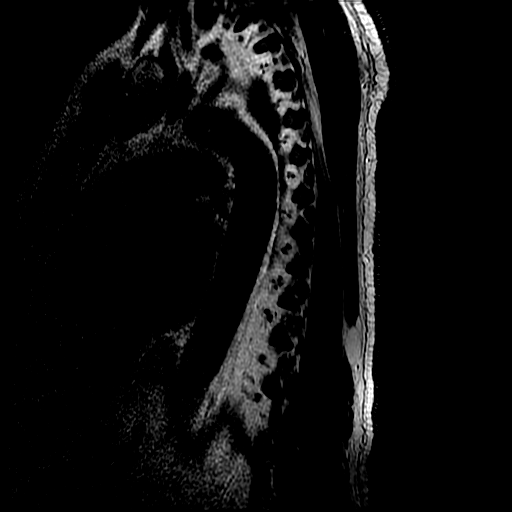

[Series 23: T2 · axial · 4.0mm · 0.39mm/px · z∈[-316,-38]mm · 2 of 45 slices shown (6 of 8)]
[im 1/45]
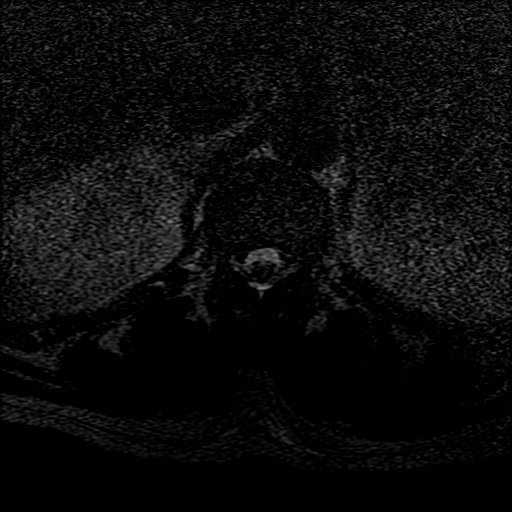
[im 45/45]
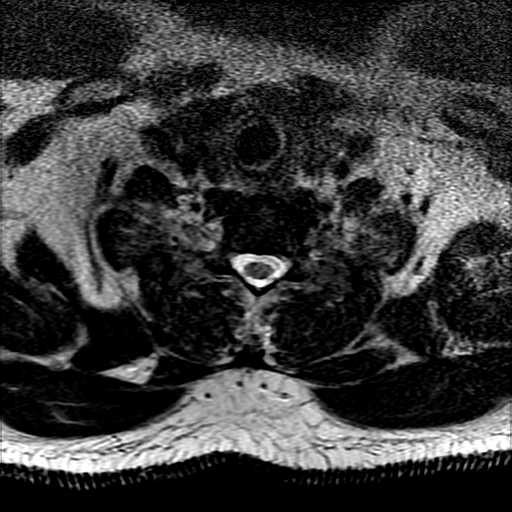

[Series 28: T2 · sagittal · 4.0mm · 0.53mm/px · 1 of 16 slices shown (7 of 8)]
[im 1/16]
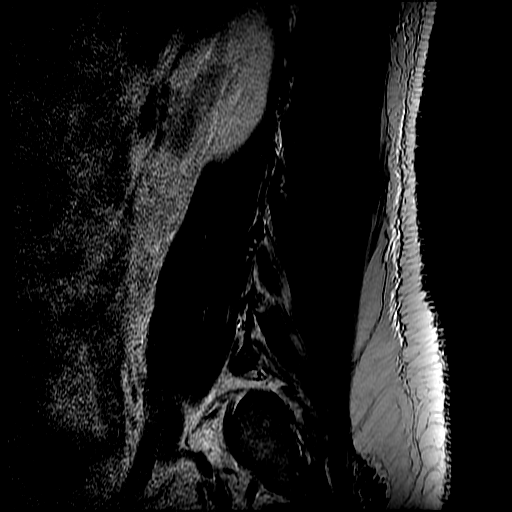

[Series 30: T2 · axial · 4.0mm · 0.39mm/px · z∈[+11,+211]mm · 2 of 41 slices shown (8 of 8)]
[im 1/41]
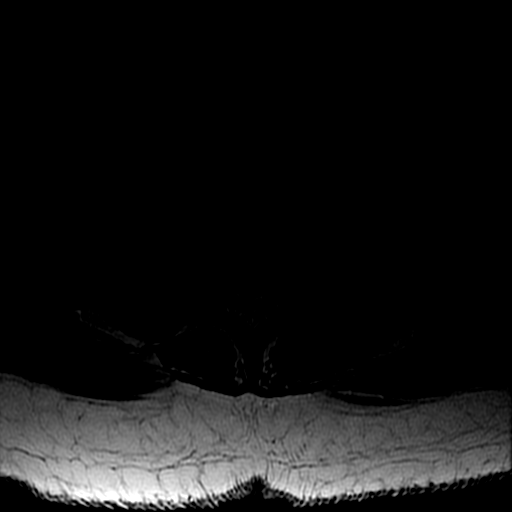
[im 41/41]
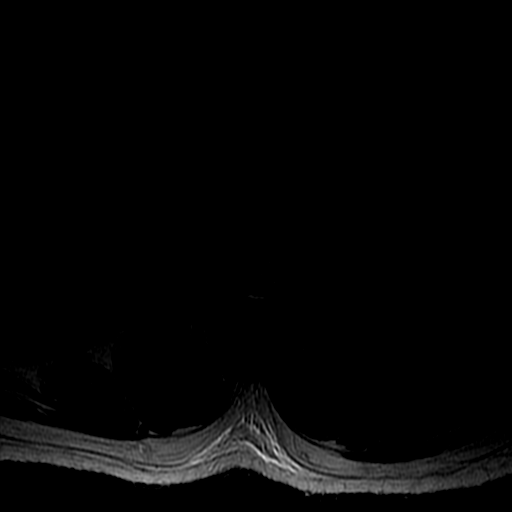

[19 of 48 positions shown; findings below may reference images not displayed]

FINDINGS: Alignment:  Normal

Vertebrae: No fracture or primary bone lesion.

Cord:  No cord compression or primary cord lesion.

Paraspinal and other soft tissues: No visible pleural effusions.

Disc levels:

No significant disc or disc level pathology. No stenosis of the
canal or foramina. No significant facet arthropathy.
IMPRESSION: Negative study of the thoracic spine. No abnormality seen to explain
pain. No sign of spinal infection.
